# Patient Record
Sex: Male | Born: 1935
Health system: Southern US, Community
[De-identification: ages and names within clinical notes are randomized; demographics above are authoritative.]

## PROBLEM LIST (undated history)

## (undated) DIAGNOSIS — I4892 Unspecified atrial flutter: Secondary | ICD-10-CM

## (undated) DIAGNOSIS — R001 Bradycardia, unspecified: Secondary | ICD-10-CM

## (undated) DIAGNOSIS — Z953 Presence of xenogenic heart valve: Secondary | ICD-10-CM

## (undated) DIAGNOSIS — I350 Nonrheumatic aortic (valve) stenosis: Secondary | ICD-10-CM

## (undated) DIAGNOSIS — I5032 Chronic diastolic (congestive) heart failure: Secondary | ICD-10-CM

## (undated) HISTORY — PX: CATARACT EXTRACTION: SUR2

## (undated) HISTORY — DX: Unspecified atrial flutter: I48.92

## (undated) HISTORY — DX: Bradycardia, unspecified: R00.1

## (undated) HISTORY — PX: APPENDECTOMY: SHX54

## (undated) HISTORY — PX: HAND SURGERY: SHX662

---

## 2005-01-06 ENCOUNTER — Emergency Department (HOSPITAL_COMMUNITY): Admission: EM | Admit: 2005-01-06 | Discharge: 2005-01-06 | Payer: Self-pay | Admitting: Emergency Medicine

## 2009-03-02 ENCOUNTER — Encounter: Admission: RE | Admit: 2009-03-02 | Discharge: 2009-03-02 | Payer: Self-pay | Admitting: Internal Medicine

## 2009-03-21 ENCOUNTER — Ambulatory Visit: Payer: Self-pay | Admitting: Thoracic Surgery

## 2009-06-04 ENCOUNTER — Encounter: Admission: RE | Admit: 2009-06-04 | Discharge: 2009-06-04 | Payer: Self-pay | Admitting: Internal Medicine

## 2009-06-27 ENCOUNTER — Ambulatory Visit: Payer: Self-pay | Admitting: Thoracic Surgery

## 2010-04-07 ENCOUNTER — Encounter: Payer: Self-pay | Admitting: Internal Medicine

## 2010-07-30 NOTE — Letter (Signed)
March 21, 2009   Massie Maroon, MD  [address]  [place], [place] [address]   Re:  LEVY, CEDANO                  DOB:  Aug 16, 1935   Dear Dr. Selena Batten:   I saw the patient back today.  This 75 year old patient is retired, who  was recently doing some work on a saw and has developed a cough.  He  quit smoking many years ago.  Chest x-ray and CT scan was obtained.  The  CT scan showed a 14.9-mm ground-glass lesion in the superior segment of  the right lower lobe.  There are 2 other ill-defined areas in the right  lower lobe.  He has had no fever, chills, or excessive sputum.  No  hemoptysis.   PAST MEDICAL HISTORY:   MEDICATIONS:  He takes aspirin, Mucinex, Spiriva, vitamin E, and  Benadryl.   ALLERGIES:  He has no allergies.   FAMILY HISTORY:  Noncontributory.   SOCIAL HISTORY:  He is married, 2 children.  He is retired.  Quit  smoking in 1968.  Does not drink alcohol on a regular basis.   REVIEW OF SYMPTOMS:  VITAL SIGNS:  He is 194 pounds, he is 6 feet 1  inch.  CARDIAC:  No angina or atrial fibrillation.  PULMONARY:  Productive cough and see history of present illness.  GI:  No nausea, vomiting, constipation, or diarrhea.  GU:  No kidney disease, dysuria, or frequent urination.  VASCULAR:  No claudication, DVT, or TIAs.  NEUROLOGICAL:  No dizziness, headaches, blackouts, or seizures.  MUSCULOSKELETAL:  No arthritis.  PSYCHIATRIC:  No depression or nervousness.  EYE/ENT:  No changes in eyesight or hearing.  HEMATOLOGICAL:  No problems with bleeding, clotting disorders, or  anemia.   PHYSICAL EXAMINATION:  General:  He is a well-developed white male, in  no acute distress.  Vital Signs:  His blood pressure is 142/80, pulse  73, respirations 18, and sats were 94%.  Head, Eyes, Ears, Nose, and  Throat:  Unremarkable.  Neck:  Supple without thyromegaly.  There is no  supraclavicular or axillary adenopathy.  Chest:  Clear to auscultation  and percussion.  Heart:  Regular  sinus rhythm.  No murmurs.  Abdomen:  Soft.  There is no hepatosplenomegaly.  Extremities:  Pulses are 2+.  There is no clubbing or edema.   I feel that the patient probably may have a bronchoalveolar cancer, but  since this is very small and slow growing, I think the best step is just  to follow with serial CT scans.  We plan to get 1 again in 3 months and  then if that is stable in 6 months.  Should there be any change, then we  will proceed with either biopsy or resection and/or a PET scan.  A PET  scan would just more likely be negative at this time, so I will wait  until there is evidence of increased size before proceeding with a PET  scan.  I appreciate the opportunity of seeing the patient.   Sincerely,   Ines Bloomer, M.D.  Electronically Signed   DPB/MEDQ  D:  03/21/2009  T:  03/22/2009  Job:  [id number]

## 2010-07-30 NOTE — Assessment & Plan Note (Signed)
OFFICE VISIT   Erik [name], Erik [name]  DOB:  October 14, 1935                                        June 27, 2009  CHART #:  [id number]   The patient returns today.  His blood pressure was 149/92, pulse 80,  respirations 17, sats 98%.  Lungs clear to auscultation and percussion.  He is doing well overall.  His CT scan shows interval resolution of the  peribronchial vascular ground-glass in the areas in the right lower  lobe, so I think this was all inflammatory.  I will be happy to see him  again if he has any future pulmonary problem.   Ines Bloomer, M.D.  Electronically Signed   DPB/MEDQ  D:  06/27/2009  T:  06/28/2009  Job:  [id number]

## 2011-11-27 DIAGNOSIS — D485 Neoplasm of uncertain behavior of skin: Secondary | ICD-10-CM | POA: Diagnosis not present

## 2012-04-01 DIAGNOSIS — Z961 Presence of intraocular lens: Secondary | ICD-10-CM | POA: Diagnosis not present

## 2012-04-01 DIAGNOSIS — H25019 Cortical age-related cataract, unspecified eye: Secondary | ICD-10-CM | POA: Diagnosis not present

## 2012-04-01 DIAGNOSIS — H2510 Age-related nuclear cataract, unspecified eye: Secondary | ICD-10-CM | POA: Diagnosis not present

## 2012-04-08 DIAGNOSIS — H2510 Age-related nuclear cataract, unspecified eye: Secondary | ICD-10-CM | POA: Diagnosis not present

## 2012-05-03 DIAGNOSIS — H2510 Age-related nuclear cataract, unspecified eye: Secondary | ICD-10-CM | POA: Diagnosis not present

## 2012-05-03 DIAGNOSIS — H25049 Posterior subcapsular polar age-related cataract, unspecified eye: Secondary | ICD-10-CM | POA: Diagnosis not present

## 2012-05-03 DIAGNOSIS — H25019 Cortical age-related cataract, unspecified eye: Secondary | ICD-10-CM | POA: Diagnosis not present

## 2012-07-27 DIAGNOSIS — Z79899 Other long term (current) drug therapy: Secondary | ICD-10-CM | POA: Diagnosis not present

## 2012-07-27 DIAGNOSIS — E7800 Pure hypercholesterolemia, unspecified: Secondary | ICD-10-CM | POA: Diagnosis not present

## 2012-07-27 DIAGNOSIS — Z125 Encounter for screening for malignant neoplasm of prostate: Secondary | ICD-10-CM | POA: Diagnosis not present

## 2012-07-27 DIAGNOSIS — R7309 Other abnormal glucose: Secondary | ICD-10-CM | POA: Diagnosis not present

## 2012-08-03 DIAGNOSIS — R7309 Other abnormal glucose: Secondary | ICD-10-CM | POA: Diagnosis not present

## 2012-08-03 DIAGNOSIS — M109 Gout, unspecified: Secondary | ICD-10-CM | POA: Diagnosis not present

## 2012-08-03 DIAGNOSIS — R011 Cardiac murmur, unspecified: Secondary | ICD-10-CM | POA: Diagnosis not present

## 2012-08-03 DIAGNOSIS — E7800 Pure hypercholesterolemia, unspecified: Secondary | ICD-10-CM | POA: Diagnosis not present

## 2012-08-23 DIAGNOSIS — R011 Cardiac murmur, unspecified: Secondary | ICD-10-CM | POA: Diagnosis not present

## 2012-08-23 DIAGNOSIS — R9431 Abnormal electrocardiogram [ECG] [EKG]: Secondary | ICD-10-CM | POA: Diagnosis not present

## 2013-07-14 DIAGNOSIS — Z961 Presence of intraocular lens: Secondary | ICD-10-CM | POA: Diagnosis not present

## 2013-07-14 DIAGNOSIS — H02839 Dermatochalasis of unspecified eye, unspecified eyelid: Secondary | ICD-10-CM | POA: Diagnosis not present

## 2013-07-14 DIAGNOSIS — H1045 Other chronic allergic conjunctivitis: Secondary | ICD-10-CM | POA: Diagnosis not present

## 2013-07-14 DIAGNOSIS — H43399 Other vitreous opacities, unspecified eye: Secondary | ICD-10-CM | POA: Diagnosis not present

## 2014-06-19 ENCOUNTER — Observation Stay (HOSPITAL_COMMUNITY)
Admission: EM | Admit: 2014-06-19 | Discharge: 2014-06-23 | Disposition: A | Discharge status: Home / Self Care | Payer: Medicare Other | Attending: Internal Medicine | Admitting: Internal Medicine

## 2014-06-19 ENCOUNTER — Emergency Department (HOSPITAL_COMMUNITY): Payer: Medicare Other

## 2014-06-19 ENCOUNTER — Encounter (HOSPITAL_COMMUNITY): Payer: Self-pay | Admitting: Emergency Medicine

## 2014-06-19 DIAGNOSIS — H538 Other visual disturbances: Secondary | ICD-10-CM | POA: Diagnosis not present

## 2014-06-19 DIAGNOSIS — Z87891 Personal history of nicotine dependence: Secondary | ICD-10-CM | POA: Insufficient documentation

## 2014-06-19 DIAGNOSIS — R079 Chest pain, unspecified: Secondary | ICD-10-CM | POA: Diagnosis present

## 2014-06-19 DIAGNOSIS — Z7982 Long term (current) use of aspirin: Secondary | ICD-10-CM | POA: Diagnosis not present

## 2014-06-19 DIAGNOSIS — R0602 Shortness of breath: Secondary | ICD-10-CM | POA: Diagnosis not present

## 2014-06-19 DIAGNOSIS — R778 Other specified abnormalities of plasma proteins: Secondary | ICD-10-CM | POA: Diagnosis present

## 2014-06-19 DIAGNOSIS — I5032 Chronic diastolic (congestive) heart failure: Secondary | ICD-10-CM | POA: Diagnosis not present

## 2014-06-19 DIAGNOSIS — Z9049 Acquired absence of other specified parts of digestive tract: Secondary | ICD-10-CM | POA: Insufficient documentation

## 2014-06-19 DIAGNOSIS — I350 Nonrheumatic aortic (valve) stenosis: Principal | ICD-10-CM | POA: Insufficient documentation

## 2014-06-19 DIAGNOSIS — I509 Heart failure, unspecified: Secondary | ICD-10-CM

## 2014-06-19 DIAGNOSIS — I2510 Atherosclerotic heart disease of native coronary artery without angina pectoris: Secondary | ICD-10-CM | POA: Diagnosis not present

## 2014-06-19 DIAGNOSIS — R7989 Other specified abnormal findings of blood chemistry: Secondary | ICD-10-CM | POA: Diagnosis not present

## 2014-06-19 DIAGNOSIS — R55 Syncope and collapse: Secondary | ICD-10-CM | POA: Diagnosis not present

## 2014-06-19 DIAGNOSIS — R42 Dizziness and giddiness: Secondary | ICD-10-CM | POA: Diagnosis not present

## 2014-06-19 DIAGNOSIS — I25119 Atherosclerotic heart disease of native coronary artery with unspecified angina pectoris: Secondary | ICD-10-CM

## 2014-06-19 HISTORY — DX: Nonrheumatic aortic (valve) stenosis: I35.0

## 2014-06-19 HISTORY — DX: Chronic diastolic (congestive) heart failure: I50.32

## 2014-06-19 LAB — LIPID PANEL
CHOL/HDL RATIO: 3.9 ratio
CHOLESTEROL: 135 mg/dL (ref 0–200)
HDL: 35 mg/dL — ABNORMAL LOW (ref >39)
LDL Cholesterol: 88 mg/dL (ref 0–99)
TRIGLYCERIDES: 60 mg/dL (ref <150)
VLDL: 12 mg/dL (ref 0–40)

## 2014-06-19 LAB — CBC WITH DIFFERENTIAL/PLATELET
BASOS ABS: 0.1 10*3/uL (ref 0.0–0.1)
Basophils Relative: 1 % (ref 0–1)
Eosinophils Absolute: 0.1 10*3/uL (ref 0.0–0.7)
Eosinophils Relative: 2 % (ref 0–5)
HCT: 39.4 % (ref 39.0–52.0)
Hemoglobin: 14 g/dL (ref 13.0–17.0)
Lymphocytes Relative: 20 % (ref 12–46)
Lymphs Abs: 1.1 10*3/uL (ref 0.7–4.0)
MCH: 35.5 pg — ABNORMAL HIGH (ref 26.0–34.0)
MCHC: 35.5 g/dL (ref 30.0–36.0)
MCV: 100 fL (ref 78.0–100.0)
Monocytes Absolute: 0.5 10*3/uL (ref 0.1–1.0)
Monocytes Relative: 9 % (ref 3–12)
NEUTROS PCT: 68 % (ref 43–77)
Neutro Abs: 3.9 10*3/uL (ref 1.7–7.7)
PLATELETS: 172 10*3/uL (ref 150–400)
RBC: 3.94 MIL/uL — ABNORMAL LOW (ref 4.22–5.81)
RDW: 12.6 % (ref 11.5–15.5)
WBC: 5.8 10*3/uL (ref 4.0–10.5)

## 2014-06-19 LAB — TSH: TSH: 2.924 u[IU]/mL (ref 0.350–4.500)

## 2014-06-19 LAB — CREATININE, SERUM
CREATININE: 1.18 mg/dL (ref 0.50–1.35)
GFR calc Af Amer: 67 mL/min — ABNORMAL LOW (ref >90)
GFR calc non Af Amer: 58 mL/min — ABNORMAL LOW (ref >90)

## 2014-06-19 LAB — CBC
HCT: 38.1 % — ABNORMAL LOW (ref 39.0–52.0)
HEMOGLOBIN: 13.8 g/dL (ref 13.0–17.0)
MCH: 36 pg — AB (ref 26.0–34.0)
MCHC: 36.2 g/dL — ABNORMAL HIGH (ref 30.0–36.0)
MCV: 99.5 fL (ref 78.0–100.0)
PLATELETS: 168 10*3/uL (ref 150–400)
RBC: 3.83 MIL/uL — ABNORMAL LOW (ref 4.22–5.81)
RDW: 12.6 % (ref 11.5–15.5)
WBC: 5.6 10*3/uL (ref 4.0–10.5)

## 2014-06-19 LAB — BASIC METABOLIC PANEL
Anion gap: 9 (ref 5–15)
BUN: 10 mg/dL (ref 6–23)
CALCIUM: 8.8 mg/dL (ref 8.4–10.5)
CO2: 23 mmol/L (ref 19–32)
CREATININE: 1.27 mg/dL (ref 0.50–1.35)
Chloride: 102 mmol/L (ref 96–112)
GFR calc Af Amer: 62 mL/min — ABNORMAL LOW (ref >90)
GFR calc non Af Amer: 53 mL/min — ABNORMAL LOW (ref >90)
Glucose, Bld: 97 mg/dL (ref 70–99)
Potassium: 4.2 mmol/L (ref 3.5–5.1)
Sodium: 134 mmol/L — ABNORMAL LOW (ref 135–145)

## 2014-06-19 LAB — TROPONIN I
Troponin I: 0.05 ng/mL — ABNORMAL HIGH (ref <0.031)
Troponin I: 0.06 ng/mL — ABNORMAL HIGH (ref <0.031)

## 2014-06-19 LAB — D-DIMER, QUANTITATIVE (NOT AT ARMC): D DIMER QUANT: 0.66 ug{FEU}/mL — AB (ref 0.00–0.48)

## 2014-06-19 LAB — BRAIN NATRIURETIC PEPTIDE: B NATRIURETIC PEPTIDE 5: 1282.9 pg/mL — AB (ref 0.0–100.0)

## 2014-06-19 MED ORDER — SODIUM CHLORIDE 0.9 % IV SOLN
INTRAVENOUS | Status: DC
  Administered 2014-06-19: 23:00:00 via INTRAVENOUS

## 2014-06-19 MED ORDER — ASPIRIN EC 325 MG PO TBEC
325.0000 mg | DELAYED_RELEASE_TABLET | Freq: Every day | ORAL | Status: DC
  Administered 2014-06-20 – 2014-06-21 (×2): 325 mg via ORAL
  Filled 2014-06-19 (×3): qty 1

## 2014-06-19 MED ORDER — IOHEXOL 350 MG/ML SOLN
100.0000 mL | Freq: Once | INTRAVENOUS | Status: AC | PRN
  Administered 2014-06-19: 100 mL via INTRAVENOUS

## 2014-06-19 MED ORDER — HEPARIN SODIUM (PORCINE) 5000 UNIT/ML IJ SOLN
5000.0000 [IU] | Freq: Three times a day (TID) | INTRAMUSCULAR | Status: DC
  Filled 2014-06-19 (×13): qty 1

## 2014-06-19 MED ORDER — NITROGLYCERIN 0.4 MG SL SUBL: 0.4000 mg | SUBLINGUAL_TABLET | SUBLINGUAL | Status: DC | PRN

## 2014-06-19 MED ORDER — SODIUM CHLORIDE 0.9 % IJ SOLN
3.0000 mL | Freq: Two times a day (BID) | INTRAMUSCULAR | Status: DC
  Administered 2014-06-20 – 2014-06-23 (×6): 3 mL via INTRAVENOUS

## 2014-06-19 NOTE — ED Notes (Signed)
MD Pollina at bedside. 

## 2014-06-19 NOTE — H&P (Addendum)
Hospitalist Admission History and Physical  Patient name: Erik [name] Medical record number: [id number] Date of birth: 1935-08-04 Age: 80 y.o. Gender: male  Primary Care Provider: Jani Gravel, MD  Chief Complaint: chest pain   History of Present Illness:This is a 79 y.o. year old male with no known significant past medical history  presenting with chest pain. Patient states he developed sudden onset of anterior chest wall pain, tightness, shortness of breath dizziness while doing work earlier today. Symptoms progressed to have a questionable presyncopal episode patient had to sit down. Patient denies any loss of consciousness. Is fully aware of symptoms. Wife states that she grabbed patient for dose aspirin as well as water. Symptoms seemed to progressively improved. Patient was coherent by the time of EMS arrival. Patient denies any hemiparesis or confusion. Denies any known prior history of cardiac disease. Has been extremely active his whole life. Chest pain-free on arrival to the ER. Present to the ER temperature 90.8, heart rate in the 30s to the 80s-mainly in the 60s, respirations and intense 20s, blood pressure in the 90s to 120s. Satting 96% on room air. CBC and BMP within normal limits. Chest x-ray with bigeminy and noted T-wave inversions in the lateral leads. Troponin 0.05. BNP of 1280. D-dimer 0.66. Head CT within normal limits. CT angiogram negative for PE. Noted mild stable cardiomegaly on cardiac imaging.  HEART Score: 6-7  Assessment and Plan: Erik [name] is a 79 y.o. year old male presenting with chest pain    Active Problems:   Chest pain  1- Chest Pain  -typical sxs on presentation w/ some concern for possible unstable angina/cardiogenic source of sxs -HEART Score: 6-7 -mildly elevated trop @ 0.05-trend -noted bigeminy w/ t wave inversions in lateral leads-no old EKG for comparison -currently chest pain free -I have asked EDP Pollina to formally consult cards (Dr  Betsey Holiday called me back-has not been able to contact cards-I have also been unable to contact cardiology) -f/u cards recs -full dose ASA -prn NTG  -cycle CEs -risk stratification labs  -tele bed -2D ECHO-no signs of overt heart failure on exam  -anticipate stress testing vs. other invasive studies   FEN/GI: heart healthy diet  Prophylaxis: sub q heparin  Disposition: pending further evaluation  Code Status:Full Code    Patient Active Problem List   Diagnosis Date Noted  . Chest pain 06/19/2014   Past Medical History: History reviewed. No pertinent past medical history.  Past Surgical History: Past Surgical History  Procedure Laterality Date  . Appendectomy    . Hand surgery      Social History: History   Social History  . Marital Status: Divorced    Spouse Name: N/A  . Number of Children: N/A  . Years of Education: N/A   Social History Main Topics  . Smoking status: Never Smoker   . Smokeless tobacco: Not on file  . Alcohol Use: Yes     Comment: occ   . Drug Use: Not on file  . Sexual Activity: Not on file   Other Topics Concern  . None   Social History Narrative  . None    Family History: No family history on file.  Allergies: No Known Allergies  Current Facility-Administered Medications  Medication Dose Route Frequency Provider Last Rate Last Dose  . 0.9 %  sodium chloride infusion   Intravenous Continuous Deneise Lever, MD      . aspirin EC tablet 325 mg  325 mg Oral Daily Micah Flesher  Ernestina Patches, MD      . heparin injection 5,000 Units  5,000 Units Subcutaneous 3 times per day Deneise Lever, MD      . nitroGLYCERIN (NITROSTAT) SL tablet 0.4 mg  0.4 mg Sublingual Q5 min PRN Deneise Lever, MD      . sodium chloride 0.9 % injection 3 mL  3 mL Intravenous Q12H Deneise Lever, MD       Current Outpatient Prescriptions  Medication Sig Dispense Refill  . aspirin 325 MG tablet Take 325 mg by mouth daily.     Review Of Systems: 12 point ROS negative  except as noted above in HPI.  Physical Exam: Filed Vitals:   06/19/14 2030  BP: 105/66  Pulse:   Temp:   Resp: 14    General: alert and cooperative HEENT: PERRLA and extra ocular movement intact Heart: S1, S2 normal, no murmur, rub or gallop, regular rate and rhythm Lungs: clear to auscultation, no wheezes or rales and unlabored breathing Abdomen: abdomen is soft without significant tenderness, masses, organomegaly or guarding Extremities: extremities normal, atraumatic, no cyanosis or edema Skin:no rashes Neurology: normal without focal findings, mental status, speech normal, alert and oriented x3, PERLA and reflexes normal and symmetric  Labs and Imaging: Lab Results  Component Value Date/Time   NA 134* 06/19/2014 04:30 PM   K 4.2 06/19/2014 04:30 PM   CL 102 06/19/2014 04:30 PM   CO2 23 06/19/2014 04:30 PM   BUN 10 06/19/2014 04:30 PM   CREATININE 1.27 06/19/2014 04:30 PM   GLUCOSE 97 06/19/2014 04:30 PM   Lab Results  Component Value Date   WBC 5.8 06/19/2014   HGB 14.0 06/19/2014   HCT 39.4 06/19/2014   MCV 100.0 06/19/2014   PLT 172 06/19/2014    Dg Chest 2 View  06/19/2014   CLINICAL DATA:  Mid lower chest pain, onset today. Intermittent shortness of breath.  EXAM: CHEST  2 VIEW  COMPARISON:  Chest CT 06/04/2009  FINDINGS: Stable apparent cardiomegaly, accentuated by lower mediastinal fat and developmental AP chest narrowing. Normal aortic and hilar contours. There is no edema, consolidation, effusion, or pneumothorax. Remote T11 superior endplate fracture.  IMPRESSION: No active cardiopulmonary disease.   Electronically Signed   By: Monte Fantasia M.D.   On: 06/19/2014 17:23   Ct Head Wo Contrast  06/19/2014   CLINICAL DATA:  Blurred vision and chest pain.  Syncope.  EXAM: CT HEAD WITHOUT CONTRAST  TECHNIQUE: Contiguous axial images were obtained from the base of the skull through the vertex without intravenous contrast.  COMPARISON:  01/06/2005  FINDINGS: Skull  and Sinuses:Negative for fracture or destructive process. The mastoids, middle ears, and imaged paranasal sinuses are clear.  Orbits: Cataract resection bilaterally. No acute findings to explain blurred vision.  Brain: No evidence of acute infarction, hemorrhage, hydrocephalus, or intra-axial mass lesion/mass effect. Stable retro cerebellar CSF accumulation with mild mass effect on the vermis. Cerebral volume loss which is expected for age.  IMPRESSION: Negative head CT.   Electronically Signed   By: Monte Fantasia M.D.   On: 06/19/2014 20:04   Ct Angio Chest Pe W/cm &/or Wo Cm  06/19/2014   CLINICAL DATA:  Chest pain.  EXAM: CT ANGIOGRAPHY CHEST WITH CONTRAST  TECHNIQUE: Multidetector CT imaging of the chest was performed using the standard protocol during bolus administration of intravenous contrast. Multiplanar CT image reconstructions and MIPs were obtained to evaluate the vascular anatomy.  CONTRAST:  113mL OMNIPAQUE IOHEXOL 350 MG/ML SOLN  COMPARISON:  CT scan of chest of June 04, 2009.  FINDINGS: No pneumothorax or pleural effusion is noted. No acute pulmonary disease is noted. There is no evidence of pulmonary embolus. No mediastinal mass or adenopathy is noted. Visualized portion of upper abdomen appears normal. No significant osseous abnormality is noted.  Review of the MIP images confirms the above findings.  IMPRESSION: No evidence of pulmonary embolus.   Electronically Signed   By: Marijo Conception, M.D.   On: 06/19/2014 20:12           Shanda Howells MD  Pager: [phone]

## 2014-06-19 NOTE — ED Provider Notes (Addendum)
CSN: [id number]     Arrival date & time 06/19/14  1557 History   First MD Initiated Contact with Patient 06/19/14 1601     Chief Complaint  Patient presents with  . Bradycardia  . Chest Pain     (Consider location/radiation/quality/duration/timing/severity/associated sxs/prior Treatment) HPI Comments: Patient presents to the ER for evaluation of chest pain and shortness of breath. Patient reports sudden onset of difficulty breathing and substernal chest pain when he was moving a heavy hose in his yard. Patient reports that he needed to sit down because of the symptoms. Wife reports that she found him sitting in the chair and he was not responding. Eyes were open, but he did not respond to her words. She reports that he was drooling and his arms and legs were "flopping".  Patient came around and required approximately 30 minutes before symptoms resolved. Wife did give him aspirin and called EMS. He has been noted to be borderline bradycardic. At this point, patient reports that he is essentially back to normal, but still feels "weak".  Patient is a 79 y.o. male presenting with chest pain.  Chest Pain Associated symptoms: fatigue and shortness of breath     History reviewed. No pertinent past medical history. Past Surgical History  Procedure Laterality Date  . Appendectomy    . Hand surgery     No family history on file. History  Substance Use Topics  . Smoking status: Never Smoker   . Smokeless tobacco: Not on file  . Alcohol Use: Yes     Comment: occ     Review of Systems  Constitutional: Positive for fatigue.  Respiratory: Positive for shortness of breath.   Cardiovascular: Positive for chest pain.  Neurological: Positive for syncope.  All other systems reviewed and are negative.     Allergies  Review of patient's allergies indicates no known allergies.  Home Medications   Prior to Admission medications   Medication Sig Start Date End Date Taking? Authorizing  Provider  aspirin 325 MG tablet Take 325 mg by mouth daily.   Yes Historical Provider, MD   BP 95/74 mmHg  Pulse 62  Temp(Src) 98 F (36.7 C) (Oral)  Resp 16  Ht 6\' 1"  (1.854 m)  Wt 195 lb (88.451 kg)  BMI 25.73 kg/m2  SpO2 96% Physical Exam  Constitutional: He is oriented to person, place, and time. He appears well-developed and well-nourished. No distress.  HENT:  Head: Normocephalic and atraumatic.  Right Ear: Hearing normal.  Left Ear: Hearing normal.  Nose: Nose normal.  Mouth/Throat: Oropharynx is clear and moist and mucous membranes are normal.  Eyes: Conjunctivae and EOM are normal. Pupils are equal, round, and reactive to light.  Neck: Normal range of motion. Neck supple.  Cardiovascular: Regular rhythm, S1 normal and S2 normal.  Exam reveals no gallop and no friction rub.   No murmur heard. Pulmonary/Chest: Effort normal and breath sounds normal. No respiratory distress. He exhibits no tenderness.  Abdominal: Soft. Normal appearance and bowel sounds are normal. There is no hepatosplenomegaly. There is no tenderness. There is no rebound, no guarding, no tenderness at McBurney's point and negative Murphy's sign. No hernia.  Musculoskeletal: Normal range of motion.  Neurological: He is alert and oriented to person, place, and time. He has normal strength. No cranial nerve deficit or sensory deficit. Coordination normal. GCS eye subscore is 4. GCS verbal subscore is 5. GCS motor subscore is 6.  Skin: Skin is warm, dry and intact. No rash noted.  No cyanosis.  Psychiatric: He has a normal mood and affect. His speech is normal and behavior is normal. Thought content normal.  Nursing note and vitals reviewed.   ED Course  Procedures (including critical care time) Labs Review Labs Reviewed  CBC WITH DIFFERENTIAL/PLATELET - Abnormal; Notable for the following:    RBC 3.94 (*)    MCH 35.5 (*)    All other components within normal limits  BASIC METABOLIC PANEL - Abnormal;  Notable for the following:    Sodium 134 (*)    GFR calc non Af Amer 53 (*)    GFR calc Af Amer 62 (*)    All other components within normal limits  TROPONIN I - Abnormal; Notable for the following:    Troponin I 0.05 (*)    All other components within normal limits  BRAIN NATRIURETIC PEPTIDE - Abnormal; Notable for the following:    B Natriuretic Peptide 1282.9 (*)    All other components within normal limits  D-DIMER, QUANTITATIVE - Abnormal; Notable for the following:    D-Dimer, Quant 0.66 (*)    All other components within normal limits    Imaging Review Dg Chest 2 View  06/19/2014   CLINICAL DATA:  Mid lower chest pain, onset today. Intermittent shortness of breath.  EXAM: CHEST  2 VIEW  COMPARISON:  Chest CT 06/04/2009  FINDINGS: Stable apparent cardiomegaly, accentuated by lower mediastinal fat and developmental AP chest narrowing. Normal aortic and hilar contours. There is no edema, consolidation, effusion, or pneumothorax. Remote T11 superior endplate fracture.  IMPRESSION: No active cardiopulmonary disease.   Electronically Signed   By: Monte Fantasia M.D.   On: 06/19/2014 17:23   Ct Head Wo Contrast  06/19/2014   CLINICAL DATA:  Blurred vision and chest pain.  Syncope.  EXAM: CT HEAD WITHOUT CONTRAST  TECHNIQUE: Contiguous axial images were obtained from the base of the skull through the vertex without intravenous contrast.  COMPARISON:  01/06/2005  FINDINGS: Skull and Sinuses:Negative for fracture or destructive process. The mastoids, middle ears, and imaged paranasal sinuses are clear.  Orbits: Cataract resection bilaterally. No acute findings to explain blurred vision.  Brain: No evidence of acute infarction, hemorrhage, hydrocephalus, or intra-axial mass lesion/mass effect. Stable retro cerebellar CSF accumulation with mild mass effect on the vermis. Cerebral volume loss which is expected for age.  IMPRESSION: Negative head CT.   Electronically Signed   By: Monte Fantasia M.D.    On: 06/19/2014 20:04   Ct Angio Chest Pe W/cm &/or Wo Cm  06/19/2014   CLINICAL DATA:  Chest pain.  EXAM: CT ANGIOGRAPHY CHEST WITH CONTRAST  TECHNIQUE: Multidetector CT imaging of the chest was performed using the standard protocol during bolus administration of intravenous contrast. Multiplanar CT image reconstructions and MIPs were obtained to evaluate the vascular anatomy.  CONTRAST:  188mL OMNIPAQUE IOHEXOL 350 MG/ML SOLN  COMPARISON:  CT scan of chest of June 04, 2009.  FINDINGS: No pneumothorax or pleural effusion is noted. No acute pulmonary disease is noted. There is no evidence of pulmonary embolus. No mediastinal mass or adenopathy is noted. Visualized portion of upper abdomen appears normal. No significant osseous abnormality is noted.  Review of the MIP images confirms the above findings.  IMPRESSION: No evidence of pulmonary embolus.   Electronically Signed   By: Marijo Conception, M.D.   On: 06/19/2014 20:12     EKG Interpretation   Date/Time:  Monday June 19 2014 15:59:23 EDT Ventricular Rate:  5  PR Interval:  215 QRS Duration: 100 QT Interval:  459 QTC Calculation: 470 R Axis:   7 Text Interpretation:  Sinus rhythm Supraventricular bigeminy Borderline  prolonged PR interval RSR' in V1 or V2, probably normal variant Probable  anteroseptal infarct, old Repol abnrm suggests ischemia, lateral leads No  previous tracing Confirmed by Jasmarie Coppock  MD, Alita Waldren [phone]) on 06/19/2014  4:22:17 PM      MDM   Final diagnoses:  Shortness of breath  Syncope  Chest pain    Patient presents to the ER for evaluation of chest pain and syncope. Patient was working in his yard, performing some strenuous activity when he had onset of chest pain and shortness of breath. Patient sat down in a chair and then had an episode of syncope or at least near syncope. After he became more awake and alert, he was still experiencing the chest pain and shortness of breath. Symptoms lasted for approximately  30 minutes before resolution. He is back to baseline at arrival to the ER.  There was reportedly bradycardia for EMS. He has been borderline here with heart rates mainly in the low 60s. No obvious arrhythmia has been noted, although he did have PVCs present.  Patient does have a borderline troponin of 0.05. His BNP is elevated. No overt heart failure seen on chest x-ray. He does not have any significant swelling of his extremities. PE was considered. D-dimer was elevated. CT angiography was performed. No evidence of PE was noted.  Discussed with Dr. Ernestina Patches, hospitalist. Based on lateral T wave inversions, possible ST depressions, asks that cardiology be consulted. Will be admitted to the hospitalist service.  Addendum: Multiple attempts to contact cardiology were unsuccessful. Discussed briefly with Dr. Ernestina Patches, he will continue attempts. Patient was pain-free at time of admission.  Orpah Greek, MD 06/19/14 2040  Orpah Greek, MD 06/19/14 [phone]

## 2014-06-19 NOTE — ED Notes (Signed)
Attempted report x1. 

## 2014-06-19 NOTE — ED Notes (Addendum)
Patient denies pain and is resting comfortably. Phlebotomy at bedside.

## 2014-06-19 NOTE — ED Notes (Signed)
Attempted report x 2 

## 2014-06-19 NOTE — ED Notes (Addendum)
Per EMS, pt from home was outside moving a water hose when he experienced a sudden 10/10 sharp shooting episode of lower central CP with diaphoresis, lightheadedness, blurred vision, and SOB. Per wife, this episode lasted about 5 minutes. Upon EMS arrival, pt AO x 4, CP resolved, skin warm and dry. Pt given 324 ASA PTA. EMS initial HR 53 then increased into 60s. VSS. NAD noted.

## 2014-06-20 ENCOUNTER — Encounter (HOSPITAL_COMMUNITY): Payer: Self-pay | Admitting: General Practice

## 2014-06-20 DIAGNOSIS — R011 Cardiac murmur, unspecified: Secondary | ICD-10-CM

## 2014-06-20 DIAGNOSIS — I499 Cardiac arrhythmia, unspecified: Secondary | ICD-10-CM | POA: Diagnosis not present

## 2014-06-20 DIAGNOSIS — R7989 Other specified abnormal findings of blood chemistry: Secondary | ICD-10-CM

## 2014-06-20 DIAGNOSIS — R079 Chest pain, unspecified: Secondary | ICD-10-CM

## 2014-06-20 DIAGNOSIS — I359 Nonrheumatic aortic valve disorder, unspecified: Secondary | ICD-10-CM

## 2014-06-20 DIAGNOSIS — I350 Nonrheumatic aortic (valve) stenosis: Secondary | ICD-10-CM | POA: Diagnosis not present

## 2014-06-20 LAB — CBC WITH DIFFERENTIAL/PLATELET
BASOS PCT: 1 % (ref 0–1)
Basophils Absolute: 0.1 10*3/uL (ref 0.0–0.1)
EOS ABS: 0.1 10*3/uL (ref 0.0–0.7)
Eosinophils Relative: 2 % (ref 0–5)
HCT: 40.3 % (ref 39.0–52.0)
HEMOGLOBIN: 14.3 g/dL (ref 13.0–17.0)
Lymphocytes Relative: 24 % (ref 12–46)
Lymphs Abs: 1.2 10*3/uL (ref 0.7–4.0)
MCH: 36.4 pg — AB (ref 26.0–34.0)
MCHC: 35.5 g/dL (ref 30.0–36.0)
MCV: 102.5 fL — ABNORMAL HIGH (ref 78.0–100.0)
Monocytes Absolute: 0.5 10*3/uL (ref 0.1–1.0)
Monocytes Relative: 11 % (ref 3–12)
NEUTROS PCT: 62 % (ref 43–77)
Neutro Abs: 3.1 10*3/uL (ref 1.7–7.7)
Platelets: 174 10*3/uL (ref 150–400)
RBC: 3.93 MIL/uL — AB (ref 4.22–5.81)
RDW: 12.9 % (ref 11.5–15.5)
WBC: 5.1 10*3/uL (ref 4.0–10.5)

## 2014-06-20 LAB — COMPREHENSIVE METABOLIC PANEL
ALT: 16 U/L (ref 0–53)
AST: 23 U/L (ref 0–37)
Albumin: 3.6 g/dL (ref 3.5–5.2)
Alkaline Phosphatase: 59 U/L (ref 39–117)
Anion gap: 4 — ABNORMAL LOW (ref 5–15)
BUN: 8 mg/dL (ref 6–23)
CHLORIDE: 104 mmol/L (ref 96–112)
CO2: 29 mmol/L (ref 19–32)
CREATININE: 1.24 mg/dL (ref 0.50–1.35)
Calcium: 8.7 mg/dL (ref 8.4–10.5)
GFR calc Af Amer: 63 mL/min — ABNORMAL LOW (ref >90)
GFR calc non Af Amer: 55 mL/min — ABNORMAL LOW (ref >90)
Glucose, Bld: 100 mg/dL — ABNORMAL HIGH (ref 70–99)
POTASSIUM: 4.2 mmol/L (ref 3.5–5.1)
Sodium: 137 mmol/L (ref 135–145)
TOTAL PROTEIN: 6.6 g/dL (ref 6.0–8.3)
Total Bilirubin: 1.1 mg/dL (ref 0.3–1.2)

## 2014-06-20 LAB — TROPONIN I
Troponin I: 0.05 ng/mL — ABNORMAL HIGH (ref <0.031)
Troponin I: 0.05 ng/mL — ABNORMAL HIGH (ref <0.031)

## 2014-06-20 NOTE — Progress Notes (Signed)
UR completed 

## 2014-06-20 NOTE — Consult Note (Signed)
Cardiology Consultation  Erik [name]    [id number] [birth year]  Reason for Consult: Chest pain  Requesting Physician: Dr. Ernestina Patches  Primary Cardiologist: new  HPI: Erik [name] is a 79 year old white male who denies any significant cardiac history.  However, he has been told of having a murmur remotely in the past and many years ago may have had an echo cardiogram done@[email or web address]  [facility].  He has been active and denies any previous history of exertional chest pain or palpitations.  Yesterday after he had been moving toes from his yard he developed fairly sudden onset of somewhat sharp chest pain with chest tightness and also noted dizziness with transient visual disturbance.  He had to sit down for stability.  Ultimately, his discomfort subsided in a ~ 5 minutes. He presented to the emergency room and he was noted to have a bigeminal like rhythm and T-wave abnormality in his lateral leads.  Troponin was minimally positive at 0.05.  BNP was 1280, and d-dimer was 0.66.  A head CT was within normal limits and a CT angiogram was negative for PE.  Cardiology consultation is now requested.   Past Medical History  Diagnosis Date  . Heart murmur   . Chest pain 06/19/2014   Past Surgical History  Procedure Laterality Date  . Appendectomy    . Hand surgery    . Cataract extraction Bilateral ? 2013    & 2014    FAMHx: History reviewed. No pertinent family history.   Both parents are deceased.  His mother died at age 59.  His father died at 42 and had lung cancer.  SOCHx:  reports that he has quit smoking. He has never used smokeless tobacco. He reports that he drinks alcohol. He reports that he does not use illicit drugs.   He is married.  He is retired from owning a International aid/development worker.  He has 2 children.   ALLERGIES: No Known Allergies   HOME MEDICATIONS: Prescriptions prior to admission  Medication Sig Dispense Refill Last Dose  . aspirin 325 MG tablet Take 325 mg by mouth daily.    06/19/2014 at [place]: . aspirin EC  325 mg Oral Daily  . heparin  5,000 Units Subcutaneous 3 times per day  . sodium chloride  3 mL Intravenous Q12H    ROS General: Negative; No fevers, chills, or night sweats;  HEENT: Negative; No changes in vision or hearing, sinus congestion, difficulty swallowing Pulmonary: Negative; No cough, wheezing, shortness of breath, hemoptysis Cardiovascular: Negative; No chest pain, presyncope, syncope, palpitations GI: Negative; No nausea, vomiting, diarrhea, or abdominal pain GU: Negative; No dysuria, hematuria, or difficulty voiding Musculoskeletal: Negative; no myalgias, joint pain, or weakness Hematologic/Oncology: Negative; no easy bruising, bleeding Endocrine: Negative; no heat/cold intolerance; no diabetes Neuro: Negative; no changes in balance, headaches Skin: Negative; No rashes or skin lesions Psychiatric: Negative; No behavioral problems, depression Sleep: Negative; No snoring, daytime sleepiness, hypersomnolence, bruxism, restless legs, hypnogognic hallucinations, no cataplexy Other comprehensive 14 point system review is negative.  VITALS: Blood pressure 111/73, pulse 76, temperature 98.2 F (36.8 C), temperature source Oral, resp. rate 18, height 6\' 1"  (1.854 m), weight 193 lb (87.544 kg), SpO2 98 %.  PHYSICAL EXAM: General appearance: alert, cooperative and no distress HEENT: [place]/AT, PERRL EOM full; Neck: no adenopathy, no JVD, supple, symmetrical, trachea midline and thyroid not enlarged, symmetric, no tenderness/mass/nodules Lungs: clear to auscultation bilaterally Heart: Atrial bigeminal rhythm.  There is a 2-3/6 mid to late peaking harsh systolic  murmur heard best at the aortic area radiating down the left sternal border and apex suggestive of aortic valve stenosis; no diastolic murmur was appreciated.  There was no S3 gallop.  There are no rubs thrills or heaves. Abdomen: soft, non-tender; bowel sounds normal; no  masses,  no organomegaly Extremities: no edema, redness or tenderness in the calves or thighs Pulses: 2+ and symmetric Skin: Skin color, texture, turgor normal. No rashes or lesions Neurologic: Grossly normal  ECG (independently read by me): Sinus rhythm with atrial bigeminy at 63 bpm.  Mild RV conduction delay.  Mild inferior ST changes with more pronounced ST abnormality in the 4 through V6.  QTc interval mildly prolonged at 470 ms.  LABS: Results for orders placed or performed during the hospital encounter of 06/19/14 (from the past 48 hour(s))  CBC with Differential/Platelet     Status: Abnormal   Collection Time: 06/19/14  4:30 PM  Result Value Ref Range   WBC 5.8 4.0 - 10.5 K/uL   RBC 3.94 (L) 4.22 - 5.81 MIL/uL   Hemoglobin 14.0 13.0 - 17.0 g/dL   HCT 39.4 39.0 - 52.0 %   MCV 100.0 78.0 - 100.0 fL   MCH 35.5 (H) 26.0 - 34.0 pg   MCHC 35.5 30.0 - 36.0 g/dL   RDW 12.6 11.5 - 15.5 %   Platelets 172 150 - 400 K/uL   Neutrophils Relative % 68 43 - 77 %   Neutro Abs 3.9 1.7 - 7.7 K/uL   Lymphocytes Relative 20 12 - 46 %   Lymphs Abs 1.1 0.7 - 4.0 K/uL   Monocytes Relative 9 3 - 12 %   Monocytes Absolute 0.5 0.1 - 1.0 K/uL   Eosinophils Relative 2 0 - 5 %   Eosinophils Absolute 0.1 0.0 - 0.7 K/uL   Basophils Relative 1 0 - 1 %   Basophils Absolute 0.1 0.0 - 0.1 K/uL  Basic metabolic panel     Status: Abnormal   Collection Time: 06/19/14  4:30 PM  Result Value Ref Range   Sodium 134 (L) 135 - 145 mmol/L   Potassium 4.2 3.5 - 5.1 mmol/L   Chloride 102 96 - 112 mmol/L   CO2 23 19 - 32 mmol/L   Glucose, Bld 97 70 - 99 mg/dL   BUN 10 6 - 23 mg/dL   Creatinine, Ser 1.27 0.50 - 1.35 mg/dL   Calcium 8.8 8.4 - 10.5 mg/dL   GFR calc non Af Amer 53 (L) >90 mL/min   GFR calc Af Amer 62 (L) >90 mL/min    Comment: (NOTE) The eGFR has been calculated using the CKD EPI equation. This calculation has not been validated in all clinical situations. eGFR's persistently <90 mL/min signify  possible Chronic Kidney Disease.    Anion gap 9 5 - 15  Troponin I     Status: Abnormal   Collection Time: 06/19/14  4:30 PM  Result Value Ref Range   Troponin I 0.05 (H) <0.031 ng/mL    Comment:        PERSISTENTLY INCREASED TROPONIN VALUES IN THE RANGE OF 0.04-0.49 ng/mL CAN BE SEEN IN:       -UNSTABLE ANGINA       -CONGESTIVE HEART FAILURE       -MYOCARDITIS       -CHEST TRAUMA       -ARRYHTHMIAS       -LATE PRESENTING MYOCARDIAL INFARCTION       -COPD   CLINICAL FOLLOW-UP RECOMMENDED.  Brain natriuretic peptide     Status: Abnormal   Collection Time: 06/19/14  4:30 PM  Result Value Ref Range   B Natriuretic Peptide 1282.9 (H) 0.0 - 100.0 pg/mL  D-dimer, quantitative     Status: Abnormal   Collection Time: 06/19/14  4:30 PM  Result Value Ref Range   D-Dimer, Quant 0.66 (H) 0.00 - 0.48 ug/mL-FEU    Comment:        AT THE INHOUSE ESTABLISHED CUTOFF VALUE OF 0.48 ug/mL FEU, THIS ASSAY HAS BEEN DOCUMENTED IN THE LITERATURE TO HAVE A SENSITIVITY AND NEGATIVE PREDICTIVE VALUE OF AT LEAST 98 TO 99%.  THE TEST RESULT SHOULD BE CORRELATED WITH AN ASSESSMENT OF THE CLINICAL PROBABILITY OF DVT / VTE.   TSH     Status: None   Collection Time: 06/19/14  9:03 PM  Result Value Ref Range   TSH 2.924 0.350 - 4.500 uIU/mL  CBC     Status: Abnormal   Collection Time: 06/19/14  9:34 PM  Result Value Ref Range   WBC 5.6 4.0 - 10.5 K/uL   RBC 3.83 (L) 4.22 - 5.81 MIL/uL   Hemoglobin 13.8 13.0 - 17.0 g/dL   HCT 74.2 (L) 42.0 - 82.9 %   MCV 99.5 78.0 - 100.0 fL   MCH 36.0 (H) 26.0 - 34.0 pg   MCHC 36.2 (H) 30.0 - 36.0 g/dL   RDW 90.7 93.0 - 89.7 %   Platelets 168 150 - 400 K/uL  Creatinine, serum     Status: Abnormal   Collection Time: 06/19/14  9:34 PM  Result Value Ref Range   Creatinine, Ser 1.18 0.50 - 1.35 mg/dL   GFR calc non Af Amer 58 (L) >90 mL/min   GFR calc Af Amer 67 (L) >90 mL/min    Comment: (NOTE) The eGFR has been calculated using the CKD EPI  equation. This calculation has not been validated in all clinical situations. eGFR's persistently <90 mL/min signify possible Chronic Kidney Disease.   Lipid panel     Status: Abnormal   Collection Time: 06/19/14  9:34 PM  Result Value Ref Range   Cholesterol 135 0 - 200 mg/dL   Triglycerides 60 <817 mg/dL   HDL 35 (L) >52 mg/dL   Total CHOL/HDL Ratio 3.9 RATIO   VLDL 12 0 - 40 mg/dL   LDL Cholesterol 88 0 - 99 mg/dL    Comment:        Total Cholesterol/HDL:CHD Risk Coronary Heart Disease Risk Table                     Men   Women  1/2 Average Risk   3.4   3.3  Average Risk       5.0   4.4  2 X Average Risk   9.6   7.1  3 X Average Risk  23.4   11.0        Use the calculated Patient Ratio above and the CHD Risk Table to determine the patient's CHD Risk.        ATP III CLASSIFICATION (LDL):  <100     mg/dL   Optimal  036-505  mg/dL   Near or Above                    Optimal  130-159  mg/dL   Borderline  548-295  mg/dL   High  >215     mg/dL   Very High   Troponin I  Status: Abnormal   Collection Time: 06/19/14  9:34 PM  Result Value Ref Range   Troponin I 0.06 (H) <0.031 ng/mL    Comment:        PERSISTENTLY INCREASED TROPONIN VALUES IN THE RANGE OF 0.04-0.49 ng/mL CAN BE SEEN IN:       -UNSTABLE ANGINA       -CONGESTIVE HEART FAILURE       -MYOCARDITIS       -CHEST TRAUMA       -ARRYHTHMIAS       -LATE PRESENTING MYOCARDIAL INFARCTION       -COPD   CLINICAL FOLLOW-UP RECOMMENDED.   Comprehensive metabolic panel     Status: Abnormal   Collection Time: 06/20/14  2:39 AM  Result Value Ref Range   Sodium 137 135 - 145 mmol/L   Potassium 4.2 3.5 - 5.1 mmol/L   Chloride 104 96 - 112 mmol/L   CO2 29 19 - 32 mmol/L   Glucose, Bld 100 (H) 70 - 99 mg/dL   BUN 8 6 - 23 mg/dL   Creatinine, Ser 1.24 0.50 - 1.35 mg/dL   Calcium 8.7 8.4 - 10.5 mg/dL   Total Protein 6.6 6.0 - 8.3 g/dL   Albumin 3.6 3.5 - 5.2 g/dL   AST 23 0 - 37 U/L   ALT 16 0 - 53 U/L   Alkaline  Phosphatase 59 39 - 117 U/L   Total Bilirubin 1.1 0.3 - 1.2 mg/dL   GFR calc non Af Amer 55 (L) >90 mL/min   GFR calc Af Amer 63 (L) >90 mL/min    Comment: (NOTE) The eGFR has been calculated using the CKD EPI equation. This calculation has not been validated in all clinical situations. eGFR's persistently <90 mL/min signify possible Chronic Kidney Disease.    Anion gap 4 (L) 5 - 15  CBC WITH DIFFERENTIAL     Status: Abnormal   Collection Time: 06/20/14  2:39 AM  Result Value Ref Range   WBC 5.1 4.0 - 10.5 K/uL   RBC 3.93 (L) 4.22 - 5.81 MIL/uL   Hemoglobin 14.3 13.0 - 17.0 g/dL   HCT 40.3 39.0 - 52.0 %   MCV 102.5 (H) 78.0 - 100.0 fL   MCH 36.4 (H) 26.0 - 34.0 pg   MCHC 35.5 30.0 - 36.0 g/dL   RDW 12.9 11.5 - 15.5 %   Platelets 174 150 - 400 K/uL   Neutrophils Relative % 62 43 - 77 %   Neutro Abs 3.1 1.7 - 7.7 K/uL   Lymphocytes Relative 24 12 - 46 %   Lymphs Abs 1.2 0.7 - 4.0 K/uL   Monocytes Relative 11 3 - 12 %   Monocytes Absolute 0.5 0.1 - 1.0 K/uL   Eosinophils Relative 2 0 - 5 %   Eosinophils Absolute 0.1 0.0 - 0.7 K/uL   Basophils Relative 1 0 - 1 %   Basophils Absolute 0.1 0.0 - 0.1 K/uL  Troponin I     Status: Abnormal   Collection Time: 06/20/14  2:39 AM  Result Value Ref Range   Troponin I 0.05 (H) <0.031 ng/mL    Comment:        PERSISTENTLY INCREASED TROPONIN VALUES IN THE RANGE OF 0.04-0.49 ng/mL CAN BE SEEN IN:       -UNSTABLE ANGINA       -CONGESTIVE HEART FAILURE       -MYOCARDITIS       -CHEST TRAUMA       -ARRYHTHMIAS       -  LATE PRESENTING MYOCARDIAL INFARCTION       -COPD   CLINICAL FOLLOW-UP RECOMMENDED.     IMAGING: Dg Chest 2 View  06/19/2014   CLINICAL DATA:  Mid lower chest pain, onset today. Intermittent shortness of breath.  EXAM: CHEST  2 VIEW  COMPARISON:  Chest CT 06/04/2009  FINDINGS: Stable apparent cardiomegaly, accentuated by lower mediastinal fat and developmental AP chest narrowing. Normal aortic and hilar contours. There is  no edema, consolidation, effusion, or pneumothorax. Remote T11 superior endplate fracture.  IMPRESSION: No active cardiopulmonary disease.   Electronically Signed   By: Monte Fantasia M.D.   On: 06/19/2014 17:23   Ct Head Wo Contrast  06/19/2014   CLINICAL DATA:  Blurred vision and chest pain.  Syncope.  EXAM: CT HEAD WITHOUT CONTRAST  TECHNIQUE: Contiguous axial images were obtained from the base of the skull through the vertex without intravenous contrast.  COMPARISON:  01/06/2005  FINDINGS: Skull and Sinuses:Negative for fracture or destructive process. The mastoids, middle ears, and imaged paranasal sinuses are clear.  Orbits: Cataract resection bilaterally. No acute findings to explain blurred vision.  Brain: No evidence of acute infarction, hemorrhage, hydrocephalus, or intra-axial mass lesion/mass effect. Stable retro cerebellar CSF accumulation with mild mass effect on the vermis. Cerebral volume loss which is expected for age.  IMPRESSION: Negative head CT.   Electronically Signed   By: Monte Fantasia M.D.   On: 06/19/2014 20:04   Ct Angio Chest Pe W/cm &/or Wo Cm  06/19/2014   CLINICAL DATA:  Chest pain.  EXAM: CT ANGIOGRAPHY CHEST WITH CONTRAST  TECHNIQUE: Multidetector CT imaging of the chest was performed using the standard protocol during bolus administration of intravenous contrast. Multiplanar CT image reconstructions and MIPs were obtained to evaluate the vascular anatomy.  CONTRAST:  185mL OMNIPAQUE IOHEXOL 350 MG/ML SOLN  COMPARISON:  CT scan of chest of June 04, 2009.  FINDINGS: No pneumothorax or pleural effusion is noted. No acute pulmonary disease is noted. There is no evidence of pulmonary embolus. No mediastinal mass or adenopathy is noted. Visualized portion of upper abdomen appears normal. No significant osseous abnormality is noted.  Review of the MIP images confirms the above findings.  IMPRESSION: No evidence of pulmonary embolus.   Electronically Signed   By: Marijo Conception,  M.D.   On: 06/19/2014 20:12    IMPRESSION:  1.  Chest pain/presyncope/ mildly elevated troponin 2.  Physical examination highly suggestive of at least moderate to moderately severe aortic valve stenosis with a mid-to-late peaking systolic harsh murmur.  3.  Abnormal ECG with significant T-wave abnormality V4 through V6 and mild ST changes inferiorly; this may be secondary to LVH with repolarization changes from aortic valve stenosis versus ischemia. 4.  Atrial bigeminal rhythm 5.  Elevated BNP probably contributed by transient acute systolic and diastolic heart failure   RECOMMENDATION: The patient's physical exam suggests aortic valve stenosis.  With his absence of hypertension by history, but with evidence for significant ST-T changes on ECG he may have LVH with strain secondary to aortic valve stenosis.  I suspect with history  Yesterday he may have developed transient cerebral hypoperfusion as result of his aortic stenosis contributing to his presyncope.  Initial troponin is mildly positive.  Recommend 2-D echo Doppler study to evaluate systolic and diastolic function, assessment for wall motion abnormality, as well as valvular architecture.  Previous to yesterday's episode the patient denies any change in exertional capacity, or exertional chest pressure.  If significant aortic  stenosis is demonstrated.  I would recommend right and left heart cardiac catheterization for definitive assessment of his coronary as well as valvular anatomy.  Attending:  Troy Sine, MD, [facility] 06/20/2014 12:06 PM

## 2014-06-20 NOTE — Care Management Note (Signed)
    Page 1 of 1   06/23/2014     5:14:09 PM CARE MANAGEMENT NOTE 06/23/2014  Patient:  Erik [name], Erik [name]   Account Number:  [id number]  Date Initiated:  06/20/2014  Documentation initiated by:  Yohana Bartha  Subjective/Objective Assessment:   Pt adm on 06/19/14 with CP with pos troponins.  PTA, pt resides at home with spouse.     Action/Plan:   Will follow for dc needs as pt progresses.   Anticipated DC Date:  06/21/2014   Anticipated DC Plan:  [place]  CM consult      Choice offered to / List presented to:             Status of service:  Completed, signed off Medicare Important Message given?  YES (If response is "NO", the following Medicare IM given date fields will be blank) Date Medicare IM given:  06/22/2014 Medicare IM given by:  Meldon Hanzlik Date Additional Medicare IM given:   Additional Medicare IM given by:    Discharge Disposition:  HOME/SELF CARE  Per UR Regulation:  Reviewed for med. necessity/level of care/duration of stay  If discussed at [place] of Stay Meetings, dates discussed:    Comments:

## 2014-06-20 NOTE — Progress Notes (Signed)
[facility] PROGRESS NOTE  Erik [name] KGM:[id number] DOB: 1935/11/23 DOA: 06/19/2014 PCP: Jani Gravel, MD  Brief narrative 79 year old male with no prior medical history ( reports having a murmur since childhood) presented with chest pain after he was working in his yard and lifting the water hose. The pain was substernal and radiating across the chest lasting for a few minutes. As per the patient's wife he also complained of some dizziness and transient visual disturbance. The chest pain subsided after EMS arrived. In the ED EKG showed bigeminal rhythm and T-wave inversion in lateral leads with minimally elevated troponin. BNP was 1218 and mildly elevated d-dimer. Head CT was unremarkable a CT angiogram was negative for PE. Patient admitted to hospitalist service on telemetry. Cardiology consulted.   Assessment/Plan: Chest pain with elevated troponin Monitor on telemetry. No further chest pain symptoms. Troponin has peaked at 0.6 . Has normal EKG changes with T-wave inversion in lateral leads. Also has significant systolic murmur was for cardiology is concerning for moderate to severe aortic stenosis.Also his transient visual symptoms could be related to cerebral hypoperfusion from underlying severe AS. 2-D echo pending. - continue full dose aspirin and S/l nitrate. Avoid afterload reducing agents for now. check lipid panel in a.m. -Cardiology plan on right and left heart catheterization.  Diet: Heart healthy DVT prophylaxis: Subcutaneous heparin  Code Status:full code  Communication: wife at bedside  Disposition Plan: inpt  Consultants:  cardiology  Procedures:  2d echo pending  Antibiotics: None  HPI/Subjective  patient seen and examined. Denies any further chest pain. Admission H&P reviewed  Objective: Filed Vitals:   06/20/14 0957  BP: 111/73  Pulse: 76  Temp:   Resp: 18    Intake/Output Summary (Last 24 hours) at 06/20/14 1327 Last data filed at 06/20/14  0957  Gross per 24 hour  Intake 1180.83 ml  Output      0 ml  Net 1180.83 ml   Filed Weights   06/19/14 1602 06/19/14 2256  Weight: 88.451 kg (195 lb) 87.544 kg (193 lb)    Exam:   General:  Elderly male in no acute distress  HEENT: No pallor, moist oral mucosa, supple neck, no JVD  Cardiovascular: normal U4&Q0, systolic murmur 4/6   Chest: Clear to auscultation bilaterally   Abdomen: soft, NT, ND  Musculoskeletal: On, no edema  CNS: Alert and oriented   Data Reviewed: Basic Metabolic Panel:  Recent Labs Lab 06/19/14 1630 06/19/14 2134 06/20/14 0239  NA 134*  --  137  K 4.2  --  4.2  CL 102  --  104  CO2 23  --  29  GLUCOSE 97  --  100*  BUN 10  --  8  CREATININE 1.27 1.18 1.24  CALCIUM 8.8  --  8.7   Liver Function Tests:  Recent Labs Lab 06/20/14 0239  AST 23  ALT 16  ALKPHOS 59  BILITOT 1.1  PROT 6.6  ALBUMIN 3.6   No results for input(s): LIPASE, AMYLASE in the last 168 hours. No results for input(s): AMMONIA in the last 168 hours. CBC:  Recent Labs Lab 06/19/14 1630 06/19/14 2134 06/20/14 0239  WBC 5.8 5.6 5.1  NEUTROABS 3.9  --  3.1  HGB 14.0 13.8 14.3  HCT 39.4 38.1* 40.3  MCV 100.0 99.5 102.5*  PLT 172 168 174   Cardiac Enzymes:  Recent Labs Lab 06/19/14 1630 06/19/14 2134 06/20/14 0239 06/20/14 1034  TROPONINI 0.05* 0.06* 0.05* 0.05*   BNP (last 3 results)  Recent Labs  06/19/14 1630  BNP 1282.9*    ProBNP (last 3 results) No results for input(s): PROBNP in the last 8760 hours.  CBG: No results for input(s): GLUCAP in the last 168 hours.  No results found for this or any previous visit (from the past 240 hour(s)).   Studies: Dg Chest 2 View  06/19/2014   CLINICAL DATA:  Mid lower chest pain, onset today. Intermittent shortness of breath.  EXAM: CHEST  2 VIEW  COMPARISON:  Chest CT 06/04/2009  FINDINGS: Stable apparent cardiomegaly, accentuated by lower mediastinal fat and developmental AP chest narrowing.  Normal aortic and hilar contours. There is no edema, consolidation, effusion, or pneumothorax. Remote T11 superior endplate fracture.  IMPRESSION: No active cardiopulmonary disease.   Electronically Signed   By: Monte Fantasia M.D.   On: 06/19/2014 17:23   Ct Head Wo Contrast  06/19/2014   CLINICAL DATA:  Blurred vision and chest pain.  Syncope.  EXAM: CT HEAD WITHOUT CONTRAST  TECHNIQUE: Contiguous axial images were obtained from the base of the skull through the vertex without intravenous contrast.  COMPARISON:  01/06/2005  FINDINGS: Skull and Sinuses:Negative for fracture or destructive process. The mastoids, middle ears, and imaged paranasal sinuses are clear.  Orbits: Cataract resection bilaterally. No acute findings to explain blurred vision.  Brain: No evidence of acute infarction, hemorrhage, hydrocephalus, or intra-axial mass lesion/mass effect. Stable retro cerebellar CSF accumulation with mild mass effect on the vermis. Cerebral volume loss which is expected for age.  IMPRESSION: Negative head CT.   Electronically Signed   By: Monte Fantasia M.D.   On: 06/19/2014 20:04   Ct Angio Chest Pe W/cm &/or Wo Cm  06/19/2014   CLINICAL DATA:  Chest pain.  EXAM: CT ANGIOGRAPHY CHEST WITH CONTRAST  TECHNIQUE: Multidetector CT imaging of the chest was performed using the standard protocol during bolus administration of intravenous contrast. Multiplanar CT image reconstructions and MIPs were obtained to evaluate the vascular anatomy.  CONTRAST:  130mL OMNIPAQUE IOHEXOL 350 MG/ML SOLN  COMPARISON:  CT scan of chest of June 04, 2009.  FINDINGS: No pneumothorax or pleural effusion is noted. No acute pulmonary disease is noted. There is no evidence of pulmonary embolus. No mediastinal mass or adenopathy is noted. Visualized portion of upper abdomen appears normal. No significant osseous abnormality is noted.  Review of the MIP images confirms the above findings.  IMPRESSION: No evidence of pulmonary embolus.    Electronically Signed   By: Marijo Conception, M.D.   On: 06/19/2014 20:12    Scheduled Meds: . aspirin EC  325 mg Oral Daily  . heparin  5,000 Units Subcutaneous 3 times per day  . sodium chloride  3 mL Intravenous Q12H   Continuous Infusions:     Time spent: 25 minutes    Sachi Boulay, [place]  [facility] Pager [phone]. If 7PM-7AM, please contact night-coverage at [email or web address], password [facility] 06/20/2014, 1:27 PM

## 2014-06-20 NOTE — Progress Notes (Signed)
Pt. Arrived to unit from ED in stable condition. Pt. Alert and oriented. No s/s of distress or discomfort noted. Pt. Denies any pain at this time. Pt. Oriented to room and placed on telemetry. CCMD notified. VSS. On call MD, Hal Hope paged to notify of pts. Arrival to floor. RN will continue to monitor pt. For changes in condition. Clemmie Marxen, Santiago Glad Cherrell]

## 2014-06-21 DIAGNOSIS — R072 Precordial pain: Secondary | ICD-10-CM | POA: Diagnosis not present

## 2014-06-21 DIAGNOSIS — R7989 Other specified abnormal findings of blood chemistry: Secondary | ICD-10-CM

## 2014-06-21 DIAGNOSIS — R55 Syncope and collapse: Secondary | ICD-10-CM | POA: Diagnosis present

## 2014-06-21 DIAGNOSIS — R079 Chest pain, unspecified: Secondary | ICD-10-CM | POA: Diagnosis not present

## 2014-06-21 DIAGNOSIS — I350 Nonrheumatic aortic (valve) stenosis: Secondary | ICD-10-CM | POA: Diagnosis not present

## 2014-06-21 DIAGNOSIS — R778 Other specified abnormalities of plasma proteins: Secondary | ICD-10-CM | POA: Diagnosis present

## 2014-06-21 DIAGNOSIS — R011 Cardiac murmur, unspecified: Secondary | ICD-10-CM | POA: Diagnosis not present

## 2014-06-21 LAB — CBC WITH DIFFERENTIAL/PLATELET
Basophils Absolute: 0.1 10*3/uL (ref 0.0–0.1)
Basophils Relative: 1 % (ref 0–1)
EOS ABS: 0.1 10*3/uL (ref 0.0–0.7)
Eosinophils Relative: 3 % (ref 0–5)
HCT: 36.9 % — ABNORMAL LOW (ref 39.0–52.0)
HEMOGLOBIN: 13.4 g/dL (ref 13.0–17.0)
LYMPHS PCT: 21 % (ref 12–46)
Lymphs Abs: 1 10*3/uL (ref 0.7–4.0)
MCH: 38.3 pg — ABNORMAL HIGH (ref 26.0–34.0)
MCHC: 36.3 g/dL — AB (ref 30.0–36.0)
MCV: 105.4 fL — ABNORMAL HIGH (ref 78.0–100.0)
MONO ABS: 0.5 10*3/uL (ref 0.1–1.0)
MONOS PCT: 10 % (ref 3–12)
NEUTROS PCT: 65 % (ref 43–77)
Neutro Abs: 3.2 10*3/uL (ref 1.7–7.7)
Platelets: 177 10*3/uL (ref 150–400)
RBC: 3.5 MIL/uL — ABNORMAL LOW (ref 4.22–5.81)
RDW: 13.2 % (ref 11.5–15.5)
WBC: 4.9 10*3/uL (ref 4.0–10.5)

## 2014-06-21 LAB — HEMOGLOBIN A1C
Hgb A1c MFr Bld: 5.1 % (ref 4.8–5.6)
MEAN PLASMA GLUCOSE: 100 mg/dL

## 2014-06-21 LAB — COMPREHENSIVE METABOLIC PANEL
ALBUMIN: 3.2 g/dL — AB (ref 3.5–5.2)
ALK PHOS: 57 U/L (ref 39–117)
ALT: 13 U/L (ref 0–53)
ANION GAP: 8 (ref 5–15)
AST: 14 U/L (ref 0–37)
BUN: 11 mg/dL (ref 6–23)
CO2: 24 mmol/L (ref 19–32)
CREATININE: 1.26 mg/dL (ref 0.50–1.35)
Calcium: 8.9 mg/dL (ref 8.4–10.5)
Chloride: 106 mmol/L (ref 96–112)
GFR calc Af Amer: 62 mL/min — ABNORMAL LOW (ref >90)
GFR calc non Af Amer: 54 mL/min — ABNORMAL LOW (ref >90)
Glucose, Bld: 113 mg/dL — ABNORMAL HIGH (ref 70–99)
Potassium: 3.9 mmol/L (ref 3.5–5.1)
Sodium: 138 mmol/L (ref 135–145)
TOTAL PROTEIN: 5.7 g/dL — AB (ref 6.0–8.3)
Total Bilirubin: 1 mg/dL (ref 0.3–1.2)

## 2014-06-21 MED ORDER — SODIUM CHLORIDE 0.9 % IV SOLN
INTRAVENOUS | Status: DC
  Administered 2014-06-22: 75 mL/h via INTRAVENOUS

## 2014-06-21 NOTE — Progress Notes (Signed)
UR completed 

## 2014-06-21 NOTE — Progress Notes (Signed)
[facility] PROGRESS NOTE  Salem Lembke IPJ:[id number] DOB: 03/20/1935 DOA: 06/19/2014 PCP: Jani Gravel, MD  Brief narrative 79 year old male with no prior medical history ( reports having a murmur since childhood) presented with chest pain after he was working in his yard and lifting the water hose. The pain was substernal and radiating across the chest lasting for a few minutes. As per the patient's wife he also complained of some dizziness and transient visual disturbance. The chest pain subsided after EMS arrived. In the ED EKG showed bigeminal rhythm and T-wave inversion in lateral leads with minimally elevated troponin. BNP was 1218 and mildly elevated d-dimer. Head CT was unremarkable a CT angiogram was negative for PE. Patient admitted to hospitalist service on telemetry. Cardiology consulted.   Assessment/Plan: Chest pain with elevated troponin -Stable on telemetry without further symptoms. Troponin  peaked at 0.6 .  EKG changes with T-wave inversion in lateral leads. significant systolic murmur . cardiology is concerning for moderate to severe aortic stenosis. Also his transient visual symptoms could be related to cerebral hypoperfusion from underlying severe AS. 2-D echo results pending. - continue full dose aspirin and S/l nitrate. Avoid afterload reducing agents for now. Lipid panel normal -Cardiology plan on right and left heart catheterization.  Diet: Heart healthy  DVT prophylaxis: Subcutaneous heparin  Code Status:full code  Communication: wife at bedside  Disposition Plan: Pending 2-D echo results and further decision per cardiology  Consultants:  cardiology  Procedures:  2d echo   Antibiotics: None  HPI/Subjective  patient seen and examined. For the chest pain symptoms. Frustrated that he could not get echo yesterday  Objective: Filed Vitals:   06/21/14 1037  BP: 130/94  Pulse: 72  Temp:   Resp: 18    Intake/Output Summary (Last 24 hours) at  06/21/14 1122 Last data filed at 06/21/14 0907  Gross per 24 hour  Intake    840 ml  Output      0 ml  Net    840 ml   Filed Weights   06/19/14 1602 06/19/14 2256 06/21/14 0500  Weight: 88.451 kg (195 lb) 87.544 kg (193 lb) 88.27 kg (194 lb 9.6 oz)    Exam:   General:  Elderly male in no acute distress  HEENT: No pallor, moist oral mucosa, supple neck, no JVD  Cardiovascular: normal B3&A1, systolic murmur 4/6 more prominent over left second intercostal space  Chest: Clear to auscultation bilaterally   Abdomen: soft, NT, ND  Musculoskeletal: Warm, no edema    Data Reviewed: Basic Metabolic Panel:  Recent Labs Lab 06/19/14 1630 06/19/14 2134 06/20/14 0239 06/21/14 0439  NA 134*  --  137 138  K 4.2  --  4.2 3.9  CL 102  --  104 106  CO2 23  --  29 24  GLUCOSE 97  --  100* 113*  BUN 10  --  8 11  CREATININE 1.27 1.18 1.24 1.26  CALCIUM 8.8  --  8.7 8.9   Liver Function Tests:  Recent Labs Lab 06/20/14 0239 06/21/14 0439  AST 23 14  ALT 16 13  ALKPHOS 59 57  BILITOT 1.1 1.0  PROT 6.6 5.7*  ALBUMIN 3.6 3.2*   No results for input(s): LIPASE, AMYLASE in the last 168 hours. No results for input(s): AMMONIA in the last 168 hours. CBC:  Recent Labs Lab 06/19/14 1630 06/19/14 2134 06/20/14 0239 06/21/14 0439  WBC 5.8 5.6 5.1 4.9  NEUTROABS 3.9  --  3.1 3.2  HGB 14.0  13.8 14.3 13.4  HCT 39.4 38.1* 40.3 36.9*  MCV 100.0 99.5 102.5* 105.4*  PLT 172 168 174 177   Cardiac Enzymes:  Recent Labs Lab 06/19/14 1630 06/19/14 2134 06/20/14 0239 06/20/14 1034  TROPONINI 0.05* 0.06* 0.05* 0.05*   BNP (last 3 results)  Recent Labs  06/19/14 1630  BNP 1282.9*    ProBNP (last 3 results) No results for input(s): PROBNP in the last 8760 hours.  CBG: No results for input(s): GLUCAP in the last 168 hours.  No results found for this or any previous visit (from the past 240 hour(s)).   Studies: Dg Chest 2 View  06/19/2014   CLINICAL DATA:  Mid  lower chest pain, onset today. Intermittent shortness of breath.  EXAM: CHEST  2 VIEW  COMPARISON:  Chest CT 06/04/2009  FINDINGS: Stable apparent cardiomegaly, accentuated by lower mediastinal fat and developmental AP chest narrowing. Normal aortic and hilar contours. There is no edema, consolidation, effusion, or pneumothorax. Remote T11 superior endplate fracture.  IMPRESSION: No active cardiopulmonary disease.   Electronically Signed   By: Monte Fantasia M.D.   On: 06/19/2014 17:23   Ct Head Wo Contrast  06/19/2014   CLINICAL DATA:  Blurred vision and chest pain.  Syncope.  EXAM: CT HEAD WITHOUT CONTRAST  TECHNIQUE: Contiguous axial images were obtained from the base of the skull through the vertex without intravenous contrast.  COMPARISON:  01/06/2005  FINDINGS: Skull and Sinuses:Negative for fracture or destructive process. The mastoids, middle ears, and imaged paranasal sinuses are clear.  Orbits: Cataract resection bilaterally. No acute findings to explain blurred vision.  Brain: No evidence of acute infarction, hemorrhage, hydrocephalus, or intra-axial mass lesion/mass effect. Stable retro cerebellar CSF accumulation with mild mass effect on the vermis. Cerebral volume loss which is expected for age.  IMPRESSION: Negative head CT.   Electronically Signed   By: Monte Fantasia M.D.   On: 06/19/2014 20:04   Ct Angio Chest Pe W/cm &/or Wo Cm  06/19/2014   CLINICAL DATA:  Chest pain.  EXAM: CT ANGIOGRAPHY CHEST WITH CONTRAST  TECHNIQUE: Multidetector CT imaging of the chest was performed using the standard protocol during bolus administration of intravenous contrast. Multiplanar CT image reconstructions and MIPs were obtained to evaluate the vascular anatomy.  CONTRAST:  153mL OMNIPAQUE IOHEXOL 350 MG/ML SOLN  COMPARISON:  CT scan of chest of June 04, 2009.  FINDINGS: No pneumothorax or pleural effusion is noted. No acute pulmonary disease is noted. There is no evidence of pulmonary embolus. No  mediastinal mass or adenopathy is noted. Visualized portion of upper abdomen appears normal. No significant osseous abnormality is noted.  Review of the MIP images confirms the above findings.  IMPRESSION: No evidence of pulmonary embolus.   Electronically Signed   By: Marijo Conception, M.D.   On: 06/19/2014 20:12    Scheduled Meds: . aspirin EC  325 mg Oral Daily  . heparin  5,000 Units Subcutaneous 3 times per day  . sodium chloride  3 mL Intravenous Q12H   Continuous Infusions:     Time spent: 20 minutes    Adelia Baptista, [place]  [facility] Pager [phone]. If 7PM-7AM, please contact night-coverage at [email or web address], password [facility] 06/21/2014, 11:22 AM

## 2014-06-21 NOTE — Progress Notes (Signed)
Patient Name: Erik [name] Date of Encounter: 06/21/2014   Principal Problem:   Chest pain Active Problems:   Heart murmur   Pre-syncope   Elevated troponin   SUBJECTIVE  Denies chest pain, palpitations, shortness of breath, dizziness, lightheadedness, visual changes. Reports no complaints today.  CURRENT MEDS . aspirin EC  325 mg Oral Daily  . heparin  5,000 Units Subcutaneous 3 times per day  . sodium chloride  3 mL Intravenous Q12H    OBJECTIVE  Filed Vitals:   06/20/14 2040 06/21/14 0100 06/21/14 0500 06/21/14 1037  BP: 126/60 91/52 117/70 130/94  Pulse: 62 64 83 72  Temp: 97.6 F (36.4 C) 98.2 F (36.8 C) 98.2 F (36.8 C)   TempSrc: Oral Oral Oral   Resp: 18 18 18 18   Height:      Weight:   194 lb 9.6 oz (88.27 kg)   SpO2: 99% 98% 99% 99%    Intake/Output Summary (Last 24 hours) at 06/21/14 1104 Last data filed at 06/21/14 0907  Gross per 24 hour  Intake    840 ml  Output      0 ml  Net    840 ml   Filed Weights   06/19/14 1602 06/19/14 2256 06/21/14 0500  Weight: 195 lb (88.451 kg) 193 lb (87.544 kg) 194 lb 9.6 oz (88.27 kg)   PHYSICAL EXAM  General: Pleasant, NAD. Neuro: Alert and oriented X 3. Moves all extremities spontaneously. Psych: Normal affect. HEENT: Normal  Neck: Supple without bruits or JVD. Lungs:  Resp regular and unlabored, CTA. Heart: RRR. 3/6 systolic murmur loudest at RUSB. No audible S2.  No s3, s4. Abdomen: Soft, non-tender, non-distended, BS + x 4.  Extremities: No clubbing, cyanosis or edema. DP/PT/Radials 2+ and equal bilaterally.  Accessory Clinical Findings  CBC  Recent Labs  06/20/14 0239 06/21/14 0439  WBC 5.1 4.9  NEUTROABS 3.1 3.2  HGB 14.3 13.4  HCT 40.3 36.9*  MCV 102.5* 105.4*  PLT 174 330   Basic Metabolic Panel  Recent Labs  06/20/14 0239 06/21/14 0439  NA 137 138  K 4.2 3.9  CL 104 106  CO2 29 24  GLUCOSE 100* 113*  BUN 8 11  CREATININE 1.24 1.26  CALCIUM 8.7 8.9   Liver Function  Tests  Recent Labs  06/20/14 0239 06/21/14 0439  AST 23 14  ALT 16 13  ALKPHOS 59 57  BILITOT 1.1 1.0  PROT 6.6 5.7*  ALBUMIN 3.6 3.2*   Cardiac Enzymes  Recent Labs  06/19/14 2134 06/20/14 0239 06/20/14 1034  TROPONINI 0.06* 0.05* 0.05*   D-Dimer  Recent Labs  06/19/14 1630  DDIMER 0.66*   Hemoglobin A1C  Recent Labs  06/19/14 2103  HGBA1C 5.1   Fasting Lipid Panel  Recent Labs  06/19/14 2134  CHOL 135  HDL 35*  LDLCALC 88  TRIG 60  CHOLHDL 3.9   Thyroid Function Tests  Recent Labs  06/19/14 2103  TSH 2.924   TELE  RSR, 1st deg avb, freq pac's.  Radiology/Studies  Dg Chest 2 View  06/19/2014   CLINICAL DATA:  Mid lower chest pain, onset today. Intermittent shortness of breath.  EXAM: CHEST  2 VIEW  COMPARISON:  Chest CT 06/04/2009  FINDINGS: Stable apparent cardiomegaly, accentuated by lower mediastinal fat and developmental AP chest narrowing. Normal aortic and hilar contours. There is no edema, consolidation, effusion, or pneumothorax. Remote T11 superior endplate fracture.  IMPRESSION: No active cardiopulmonary disease.   Electronically Signed  By: Monte Fantasia M.D.   On: 06/19/2014 17:23   Ct Head Wo Contrast  06/19/2014   CLINICAL DATA:  Blurred vision and chest pain.  Syncope.  EXAM: CT HEAD WITHOUT CONTRAST  TECHNIQUE: Contiguous axial images were obtained from the base of the skull through the vertex without intravenous contrast.  COMPARISON:  01/06/2005  FINDINGS: Skull and Sinuses:Negative for fracture or destructive process. The mastoids, middle ears, and imaged paranasal sinuses are clear.  Orbits: Cataract resection bilaterally. No acute findings to explain blurred vision.  Brain: No evidence of acute infarction, hemorrhage, hydrocephalus, or intra-axial mass lesion/mass effect. Stable retro cerebellar CSF accumulation with mild mass effect on the vermis. Cerebral volume loss which is expected for age.  IMPRESSION: Negative head CT.    Electronically Signed   By: Monte Fantasia M.D.   On: 06/19/2014 20:04   Ct Angio Chest Pe W/cm &/or Wo Cm  06/19/2014   CLINICAL DATA:  Chest pain.  EXAM: CT ANGIOGRAPHY CHEST WITH CONTRAST  TECHNIQUE: Multidetector CT imaging of the chest was performed using the standard protocol during bolus administration of intravenous contrast. Multiplanar CT image reconstructions and MIPs were obtained to evaluate the vascular anatomy.  CONTRAST:  145mL OMNIPAQUE IOHEXOL 350 MG/ML SOLN  COMPARISON:  CT scan of chest of June 04, 2009.  FINDINGS: No pneumothorax or pleural effusion is noted. No acute pulmonary disease is noted. There is no evidence of pulmonary embolus. No mediastinal mass or adenopathy is noted. Visualized portion of upper abdomen appears normal. No significant osseous abnormality is noted.  Review of the MIP images confirms the above findings.  IMPRESSION: No evidence of pulmonary embolus.   Electronically Signed   By: Marijo Conception, M.D.   On: 06/19/2014 20:12   ASSESSMENT AND PLAN  1. Chest pain/Elevated troponin - Patient denies chest pain this morning. Mildly elevated troponin with flat trend (0.05, 0.06, 0.05, 0.05). Continue ASA, change to 81 mg daily.   2. Heart murmur - Physical exam findings consistent with aortic stenosis. He's had a murmur since the first grade but has never had an echo.  ? Bicuspid valve.  Echo pending. Will likely need right and left cardiac catheterization to assess coronary and valvular anatomy tomorrow.  3. Pre-syncope - Patient denies dizziness, lightheadedness, or visual changes this morning. Pre-syncopal episode prior to admit likely secondary to finding of heart murmur consistent with aortic stenosis.  Signed, Murray Hodgkins NP

## 2014-06-21 NOTE — Progress Notes (Signed)
  Echocardiogram 2D Echocardiogram has been performed.  Darlina Sicilian M 06/21/2014, 12:05 PM

## 2014-06-21 NOTE — Progress Notes (Signed)
Notified Dr. Debara Pickett of pt Erik [name]. Pt in stable condition. No concerns at this time. Will continue to monitor.

## 2014-06-22 ENCOUNTER — Encounter (HOSPITAL_COMMUNITY): Payer: Self-pay | Admitting: Cardiovascular Disease

## 2014-06-22 ENCOUNTER — Encounter (HOSPITAL_COMMUNITY)
Admission: EM | Disposition: A | Discharge status: Home / Self Care | Payer: Self-pay | Source: Home / Self Care | Attending: Emergency Medicine

## 2014-06-22 DIAGNOSIS — I350 Nonrheumatic aortic (valve) stenosis: Secondary | ICD-10-CM | POA: Diagnosis not present

## 2014-06-22 DIAGNOSIS — R0789 Other chest pain: Secondary | ICD-10-CM

## 2014-06-22 HISTORY — PX: LEFT AND RIGHT HEART CATHETERIZATION WITH CORONARY ANGIOGRAM: SHX5449

## 2014-06-22 LAB — CBC WITH DIFFERENTIAL/PLATELET
BASOS PCT: 2 % — AB (ref 0–1)
Basophils Absolute: 0.1 10*3/uL (ref 0.0–0.1)
Eosinophils Absolute: 0.2 10*3/uL (ref 0.0–0.7)
Eosinophils Relative: 3 % (ref 0–5)
HEMATOCRIT: 36.1 % — AB (ref 39.0–52.0)
HEMOGLOBIN: 13.1 g/dL (ref 13.0–17.0)
LYMPHS ABS: 1.1 10*3/uL (ref 0.7–4.0)
Lymphocytes Relative: 21 % (ref 12–46)
MCH: 38.1 pg — ABNORMAL HIGH (ref 26.0–34.0)
MCHC: 36.3 g/dL — ABNORMAL HIGH (ref 30.0–36.0)
MCV: 104.9 fL — ABNORMAL HIGH (ref 78.0–100.0)
MONOS PCT: 11 % (ref 3–12)
Monocytes Absolute: 0.5 10*3/uL (ref 0.1–1.0)
NEUTROS ABS: 3.3 10*3/uL (ref 1.7–7.7)
Neutrophils Relative %: 63 % (ref 43–77)
Platelets: 171 10*3/uL (ref 150–400)
RBC: 3.44 MIL/uL — ABNORMAL LOW (ref 4.22–5.81)
RDW: 13 % (ref 11.5–15.5)
WBC: 5.1 10*3/uL (ref 4.0–10.5)

## 2014-06-22 LAB — POCT I-STAT 3, ART BLOOD GAS (G3+)
Acid-base deficit: 4 mmol/L — ABNORMAL HIGH (ref 0.0–2.0)
Bicarbonate: 20.6 mEq/L (ref 20.0–24.0)
O2 Saturation: 92 %
PCO2 ART: 34 mmHg — AB (ref 35.0–45.0)
PH ART: 7.389 (ref 7.350–7.450)
TCO2: 22 mmol/L (ref 0–100)
pO2, Arterial: 64 mmHg — ABNORMAL LOW (ref 80.0–100.0)

## 2014-06-22 LAB — COMPREHENSIVE METABOLIC PANEL
ALBUMIN: 3.2 g/dL — AB (ref 3.5–5.2)
ALK PHOS: 57 U/L (ref 39–117)
ALT: 15 U/L (ref 0–53)
ANION GAP: 8 (ref 5–15)
AST: 17 U/L (ref 0–37)
BILIRUBIN TOTAL: 1.3 mg/dL — AB (ref 0.3–1.2)
BUN: 7 mg/dL (ref 6–23)
CHLORIDE: 106 mmol/L (ref 96–112)
CO2: 24 mmol/L (ref 19–32)
Calcium: 8.7 mg/dL (ref 8.4–10.5)
Creatinine, Ser: 1.22 mg/dL (ref 0.50–1.35)
GFR calc Af Amer: 65 mL/min — ABNORMAL LOW (ref >90)
GFR calc non Af Amer: 56 mL/min — ABNORMAL LOW (ref >90)
Glucose, Bld: 100 mg/dL — ABNORMAL HIGH (ref 70–99)
Potassium: 4 mmol/L (ref 3.5–5.1)
Sodium: 138 mmol/L (ref 135–145)
Total Protein: 6.6 g/dL (ref 6.0–8.3)

## 2014-06-22 LAB — POCT I-STAT 3, VENOUS BLOOD GAS (G3P V)
Acid-base deficit: 3 mmol/L — ABNORMAL HIGH (ref 0.0–2.0)
BICARBONATE: 21.6 meq/L (ref 20.0–24.0)
O2 Saturation: 63 %
PH VEN: 7.372 — AB (ref 7.250–7.300)
PO2 VEN: 33 mmHg (ref 30.0–45.0)
TCO2: 23 mmol/L (ref 0–100)
pCO2, Ven: 37.3 mmHg — ABNORMAL LOW (ref 45.0–50.0)

## 2014-06-22 LAB — PROTIME-INR
INR: 1.13 (ref 0.00–1.49)
PROTHROMBIN TIME: 14.6 s (ref 11.6–15.2)

## 2014-06-22 SURGERY — LEFT AND RIGHT HEART CATHETERIZATION WITH CORONARY ANGIOGRAM

## 2014-06-22 MED ORDER — HEPARIN SODIUM (PORCINE) 5000 UNIT/ML IJ SOLN
5000.0000 [IU] | Freq: Three times a day (TID) | INTRAMUSCULAR | Status: DC
Start: 2014-06-23 — End: 2014-06-22

## 2014-06-22 MED ORDER — SODIUM CHLORIDE 0.9 % IJ SOLN: 3.0000 mL | INTRAMUSCULAR | Status: DC | PRN

## 2014-06-22 MED ORDER — SODIUM CHLORIDE 0.9 % IV SOLN: INTRAVENOUS | Status: DC

## 2014-06-22 MED ORDER — ONDANSETRON HCL 4 MG/2ML IJ SOLN: 4.0000 mg | Freq: Four times a day (QID) | INTRAMUSCULAR | Status: DC | PRN

## 2014-06-22 MED ORDER — ASPIRIN 81 MG PO CHEW
81.0000 mg | CHEWABLE_TABLET | ORAL | Status: AC
  Administered 2014-06-22: 81 mg via ORAL
  Filled 2014-06-22: qty 1

## 2014-06-22 MED ORDER — ASPIRIN EC 81 MG PO TBEC
81.0000 mg | DELAYED_RELEASE_TABLET | Freq: Every day | ORAL | Status: DC
  Administered 2014-06-22 – 2014-06-23 (×2): 81 mg via ORAL
  Filled 2014-06-22 (×3): qty 1

## 2014-06-22 MED ORDER — SODIUM CHLORIDE 0.9 % IJ SOLN
3.0000 mL | Freq: Two times a day (BID) | INTRAMUSCULAR | Status: DC
  Administered 2014-06-22 (×2): 3 mL via INTRAVENOUS

## 2014-06-22 MED ORDER — LIDOCAINE HCL (PF) 1 % IJ SOLN
INTRAMUSCULAR | Status: AC
  Filled 2014-06-22: qty 30

## 2014-06-22 MED ORDER — SODIUM CHLORIDE 0.9 % IV SOLN: 250.0000 mL | INTRAVENOUS | Status: DC | PRN

## 2014-06-22 MED ORDER — FENTANYL CITRATE 0.05 MG/ML IJ SOLN
INTRAMUSCULAR | Status: AC
  Filled 2014-06-22: qty 2

## 2014-06-22 MED ORDER — ACETAMINOPHEN 325 MG PO TABS: 650.0000 mg | ORAL_TABLET | ORAL | Status: DC | PRN

## 2014-06-22 MED ORDER — MIDAZOLAM HCL 2 MG/2ML IJ SOLN
INTRAMUSCULAR | Status: AC
  Filled 2014-06-22: qty 2

## 2014-06-22 MED ORDER — HEPARIN (PORCINE) IN NACL 2-0.9 UNIT/ML-% IJ SOLN
INTRAMUSCULAR | Status: AC
  Filled 2014-06-22: qty 1000

## 2014-06-22 NOTE — Progress Notes (Signed)
Spoke with Kerin Ransom PA regarding aspirin dose due at 1000. He stated it is okay to hold dose since pt received 81 mg aspirin this AM.

## 2014-06-22 NOTE — Progress Notes (Signed)
Patient Name: Erik [name] Date of Encounter: 06/22/2014    Principal Problem:   Chest pain Active Problems:   Aortic stenosis, severe   Elevated troponin   Pre-syncope   Primary Cardiologist: Dr Claiborne Billings (new)  Patient Profile: 79 yo male w/ no prev cardiac issues (hx SEM), no hx HTN, DM, HL, was admitted 04/05 w/ chest pain, severe AS by echo. Cath 04/07  SUBJECTIVE: No chest pain, no SOB  OBJECTIVE Filed Vitals:   06/21/14 0100 06/21/14 0500 06/21/14 1037 06/21/14 2151  BP: 91/52 117/70 130/94 97/56  Pulse: 64 83 72 63  Temp: 98.2 F (36.8 C) 98.2 F (36.8 C)  97.8 F (36.6 C)  TempSrc: Oral Oral  Oral  Resp: 18 18 18 16   Height:      Weight:  194 lb 9.6 oz (88.27 kg)    SpO2: 98% 99% 99% 99%    Intake/Output Summary (Last 24 hours) at 06/22/14 0646 Last data filed at 06/21/14 2300  Gross per 24 hour  Intake    720 ml  Output      0 ml  Net    720 ml   Filed Weights   06/19/14 1602 06/19/14 2256 06/21/14 0500  Weight: 195 lb (88.451 kg) 193 lb (87.544 kg) 194 lb 9.6 oz (88.27 kg)    PHYSICAL EXAM General: Well developed, well nourished, male in no acute distress. Head: Normocephalic, atraumatic.  Neck: Supple without bruits, JVD not elevated. Lungs:  Resp regular and unlabored, CTA. Heart: RRR, S1, S2 is decreased, no S3, S4, 3/6 murmur; no rub. Abdomen: Soft, non-tender, non-distended, BS + x 4.  Extremities: No clubbing, cyanosis, no edema.  Neuro: Alert and oriented X 3. Moves all extremities spontaneously. Psych: Normal affect.  LABS: CBC:  Recent Labs  06/21/14 0439 06/22/14 0532  WBC 4.9 5.1  NEUTROABS 3.2 3.3  HGB 13.4 13.1  HCT 36.9* 36.1*  MCV 105.4* 104.9*  PLT 177 171   INR:  Recent Labs  06/22/14 0539  INR 7.82   Basic Metabolic Panel:  Recent Labs  06/20/14 0239 06/21/14 0439  NA 137 138  K 4.2 3.9  CL 104 106  CO2 29 24  GLUCOSE 100* 113*  BUN 8 11  CREATININE 1.24 1.26  CALCIUM 8.7 8.9   Liver Function  Tests:  Recent Labs  06/20/14 0239 06/21/14 0439  AST 23 14  ALT 16 13  ALKPHOS 59 57  BILITOT 1.1 1.0  PROT 6.6 5.7*  ALBUMIN 3.6 3.2*   Cardiac Enzymes:  Recent Labs  06/19/14 2134 06/20/14 0239 06/20/14 1034  TROPONINI 0.06* 0.05* 0.05*   BNP:  B NATRIURETIC PEPTIDE  Date/Time Value Ref Range Status  06/19/2014 04:30 PM 1282.9* 0.0 - 100.0 pg/mL Final   D-dimer:  Recent Labs  06/19/14 1630  DDIMER 0.66*   Hemoglobin A1C:  Recent Labs  06/19/14 2103  HGBA1C 5.1   Fasting Lipid Panel:  Recent Labs  06/19/14 2134  CHOL 135  HDL 35*  LDLCALC 88  TRIG 60  CHOLHDL 3.9   Thyroid Function Tests:  Recent Labs  06/19/14 2103  TSH 2.924   TELE:   SR, S brady, bigeminal PACs, unusual P wave    Radiology/Studies: Dg Chest 2 View 06/19/2014   CLINICAL DATA:  Mid lower chest pain, onset today. Intermittent shortness of breath.  EXAM: CHEST  2 VIEW  COMPARISON:  Chest CT 06/04/2009  FINDINGS: Stable apparent cardiomegaly, accentuated by lower mediastinal fat and developmental  AP chest narrowing. Normal aortic and hilar contours. There is no edema, consolidation, effusion, or pneumothorax. Remote T11 superior endplate fracture.  IMPRESSION: No active cardiopulmonary disease.   Electronically Signed   By: Monte Fantasia M.D.   On: 06/19/2014 17:23   Ct Head Wo Contrast 06/19/2014   CLINICAL DATA:  Blurred vision and chest pain.  Syncope.  EXAM: CT HEAD WITHOUT CONTRAST  TECHNIQUE: Contiguous axial images were obtained from the base of the skull through the vertex without intravenous contrast.  COMPARISON:  01/06/2005  FINDINGS: Skull and Sinuses:Negative for fracture or destructive process. The mastoids, middle ears, and imaged paranasal sinuses are clear.  Orbits: Cataract resection bilaterally. No acute findings to explain blurred vision.  Brain: No evidence of acute infarction, hemorrhage, hydrocephalus, or intra-axial mass lesion/mass effect. Stable retro  cerebellar CSF accumulation with mild mass effect on the vermis. Cerebral volume loss which is expected for age.  IMPRESSION: Negative head CT.   Electronically Signed   By: Monte Fantasia M.D.   On: 06/19/2014 20:04   Ct Angio Chest Pe W/cm &/or Wo Cm 06/19/2014   CLINICAL DATA:  Chest pain.  EXAM: CT ANGIOGRAPHY CHEST WITH CONTRAST  TECHNIQUE: Multidetector CT imaging of the chest was performed using the standard protocol during bolus administration of intravenous contrast. Multiplanar CT image reconstructions and MIPs were obtained to evaluate the vascular anatomy.  CONTRAST:  160mL OMNIPAQUE IOHEXOL 350 MG/ML SOLN  COMPARISON:  CT scan of chest of June 04, 2009.  FINDINGS: No pneumothorax or pleural effusion is noted. No acute pulmonary disease is noted. There is no evidence of pulmonary embolus. No mediastinal mass or adenopathy is noted. Visualized portion of upper abdomen appears normal. No significant osseous abnormality is noted.  Review of the MIP images confirms the above findings.  IMPRESSION: No evidence of pulmonary embolus.   Electronically Signed   By: Marijo Conception, M.D.   On: 06/19/2014 20:12    Current Medications:  . aspirin EC  325 mg Oral Daily  . heparin  5,000 Units Subcutaneous 3 times per day  . sodium chloride  3 mL Intravenous Q12H  . sodium chloride  3 mL Intravenous Q12H   . sodium chloride 75 mL/hr (06/22/14 0539)    ASSESSMENT AND PLAN: Principal Problem:   Chest pain - minimal elevation in ez - for cath today, answered questions and addressed concerns  Active Problems:   Aortic stenosis, severe - per echo 04/06 - cath will help w/ plan    Elevated troponin - cath today, probably demand ischemia    Pre-syncope - may be 2nd AS - bradycardia w/ HR 50s might cause in conjunction w/ AS  Signed, Rosaria Ferries , PA-C 6:46 AM 06/22/2014

## 2014-06-22 NOTE — Progress Notes (Signed)
[facility] PROGRESS NOTE  Ha Placeres IBB:[id number] DOB: 05-01-1935 DOA: 06/19/2014 PCP: Jani Gravel, MD  Brief narrative 79 year old male with no prior medical history ( reports having a murmur since childhood) presented with chest pain after he was working in his yard and lifting the water hose. The pain was substernal and radiating across the chest lasting for a few minutes. As per the patient's wife he also complained of some dizziness and transient visual disturbance. The chest pain subsided after EMS arrived. In the ED EKG showed bigeminal rhythm and T-wave inversion in lateral leads with minimally elevated troponin. BNP was 1218 and mildly elevated d-dimer. Head CT was unremarkable a CT angiogram was negative for PE. Patient admitted to hospitalist service on telemetry. Cardiology consulted.   Assessment/Plan: Chest pain with elevated troponin -no further symptoms. Troponin peaked at 0.6.  EKG changes with T-wave inversion in lateral leads. significant systolic murmur .  -critical  AS on 2 D echo which likely caused dizziness and Transient visual symptoms. -Avoid afterload reducing agents. Lipid panel normal. -heart cath today.  Diet: NPO for cath  DVT prophylaxis: Subcutaneous heparin  Code Status:full code  Communication: wife at bedside  Disposition Plan: Pending cath results   Consultants:  cardiology  Procedures:  2d echo   Antibiotics: None  HPI/Subjective  patient seen and examined. No chest pain  Objective: Filed Vitals:   06/22/14 1111  BP: 92/64  Pulse: 79  Temp:   Resp:     Intake/Output Summary (Last 24 hours) at 06/22/14 1137 Last data filed at 06/22/14 1051  Gross per 24 hour  Intake    480 ml  Output    225 ml  Net    255 ml   Filed Weights   06/19/14 2256 06/21/14 0500 06/22/14 0709  Weight: 87.544 kg (193 lb) 88.27 kg (194 lb 9.6 oz) 85.367 kg (188 lb 3.2 oz)    Exam:   General:   no acute distress  HEENT:  moist oral  mucosa,  no JVD  Cardiovascular: normal I5&W3, systolic murmur 4/6 more prominent over left second intercostal space  Chest: Clear bilaterally   Abdomen: soft, NT, ND  Musculoskeletal: , no edema    Data Reviewed: Basic Metabolic Panel:  Recent Labs Lab 06/19/14 1630 06/19/14 2134 06/20/14 0239 06/21/14 0439 06/22/14 0532  NA 134*  --  137 138 138  K 4.2  --  4.2 3.9 4.0  CL 102  --  104 106 106  CO2 23  --  29 24 24   GLUCOSE 97  --  100* 113* 100*  BUN 10  --  8 11 7   CREATININE 1.27 1.18 1.24 1.26 1.22  CALCIUM 8.8  --  8.7 8.9 8.7   Liver Function Tests:  Recent Labs Lab 06/20/14 0239 06/21/14 0439 06/22/14 0532  AST 23 14 17   ALT 16 13 15   ALKPHOS 59 57 57  BILITOT 1.1 1.0 1.3*  PROT 6.6 5.7* 6.6  ALBUMIN 3.6 3.2* 3.2*   No results for input(s): LIPASE, AMYLASE in the last 168 hours. No results for input(s): AMMONIA in the last 168 hours. CBC:  Recent Labs Lab 06/19/14 1630 06/19/14 2134 06/20/14 0239 06/21/14 0439 06/22/14 0532  WBC 5.8 5.6 5.1 4.9 5.1  NEUTROABS 3.9  --  3.1 3.2 3.3  HGB 14.0 13.8 14.3 13.4 13.1  HCT 39.4 38.1* 40.3 36.9* 36.1*  MCV 100.0 99.5 102.5* 105.4* 104.9*  PLT 172 168 174 177 171   Cardiac Enzymes:  Recent Labs Lab 06/19/14 1630 06/19/14 2134 06/20/14 0239 06/20/14 1034  TROPONINI 0.05* 0.06* 0.05* 0.05*   BNP (last 3 results)  Recent Labs  06/19/14 1630  BNP 1282.9*    ProBNP (last 3 results) No results for input(s): PROBNP in the last 8760 hours.  CBG: No results for input(s): GLUCAP in the last 168 hours.  No results found for this or any previous visit (from the past 240 hour(s)).   Studies: No results found.  Scheduled Meds: . aspirin EC  325 mg Oral Daily  . heparin  5,000 Units Subcutaneous 3 times per day  . sodium chloride  3 mL Intravenous Q12H  . sodium chloride  3 mL Intravenous Q12H   Continuous Infusions: . sodium chloride 75 mL/hr (06/22/14 0539)      Time spent: 20  minutes    Taleeya Blondin, [place]  [facility] Pager [phone]. If 7PM-7AM, please contact night-coverage at [email or web address], password [facility] 06/22/2014, 11:37 AM

## 2014-06-22 NOTE — Progress Notes (Signed)
Site area: RFA/RFV Site Prior to Removal:  Level 0 Pressure Applied For:73min Manual:   yes Patient Status During Pull:  stable Post Pull Site:  Level 0 Post Pull Instructions Given:  yes Post Pull Pulses Present: palpable Dressing Applied:  clear Bedrest begins @ 1700 Comments:

## 2014-06-22 NOTE — Interval H&P Note (Signed)
History and Physical Interval Note:  06/22/2014 3:23 PM  Erik [name]  has presented today for surgery, with the diagnosis of cp  The various methods of treatment have been discussed with the patient and family. After consideration of risks, benefits and other options for treatment, the patient has consented to  Procedure(s): LEFT AND RIGHT HEART CATHETERIZATION WITH CORONARY ANGIOGRAM (N/A) as a surgical intervention .  The patient's history has been reviewed, patient examined, no change in status, stable for surgery.  I have reviewed the patient's chart and labs.  Questions were answered to the patient's satisfaction.     [name]

## 2014-06-22 NOTE — CV Procedure (Signed)
Erik [name] is a 79 y.o. male   [id number]  [id number] LOCATION:  FACILITY: [facility]  PHYSICIAN: Troy Sine, MD, [facility] 1935-06-20   DATE OF PROCEDURE:  06/22/2014      RIGHT AND LEFT HEART CARDIAC CATHETERIZATION   HISTORY:  Erik [name] is a 79 y.o. male recently experiencing a brief episode of chest discomfort associated with presyncope with transient visual blurring.  Physical examination suggested a murmur of severe aortic stenosis.  An echo Doppler study revealed critical aortic stenosis.  He is now referred for right and left heart catheterization.   PROCEDURE:  Right and left heart catheterization: Swan-Ganz catheterization, cardiac Determination by the thermodilution and Fick methods, coronary angiography, left ventriculography, supravalvular aortography.  The patient was brought to the second floor [facility] cath lab in the postabsorptive state. Versed 2 mg and fentanyl 50 mcg were administered for conscious sedation. The right groin was prepped and draped in sterile fashion and a 5 [place] arterial sheath and 7 French venous sheath were inserted without difficulty. A Swan-Ganz catheter was advanced into the venous sheath and pressures were obtained in the right atrium, right ventricle, pulmonary artery, and pulmonary capillary wedge position. Cardiac outputs were obtained by the thermodilution and assumed Fick methods. Oxygen saturation was obtained in the pulmonary artery and aorta. A pigtail catheter was inserted and simultaneous AO/PA pressures were recorded.  Supravalvular aortography was performed.  The pigtail catheter was then removed and diagnostic catheterization to delineate the coronary anatomy was performed utilizing 5 French Judkins 4 left and right diagnostic catheters.  With the RCA catheter and with mobility to one leaflet clearly visible.  One brief attempt was made to cross the valve and this was able to be done fairly promptly without difficulty utilizing a  straight wire.  The RCA catheter was then exchanged for a pigtail catheter.  Simultaneous FA and LV pressures and  left ventricular and PCW pressures were recorded. Left ventriculography was performed using 25 cc of contrast at a flow rate of 12 cc/s.  A careful LV to AO pullback was performed.  Simultaneous FA  and AO pressures were recorded, which revealed a 10 mm difference between the higher FA pressure in the central aorta. All catheters were removed and the patient. Hemostasis was obtained by direct manual pressure. The patient tolerated the procedure well and returned to his room in satisfactory condition.   HEMODYNAMICS:   RA: a 4  Mean 3 RV: 30/6 PA: 29/12 PC: 18,  V 19; mean 15  AO: 105/56 PA: 33/12   LV: 178/29 AO: 109/58     Oxygen saturation in the aorta 92% and the pulmonary artery 63%  Cardiac output: 4 l/min (Thermo); 5.5 (Fick)  Cardiac index: 1.9 l/m/m2                2.6  AV: peak to peak 66-69 mm HG;  Mean 44.2  AVA 0.58 cm2  ANGIOGRAPHY:   Fluoroscopy revealed significant calcification of the aortic valve.  There was significantly reduced excursion, but visible more pronounced motion of one leaflet.  Left main: Angiographically normal vessel which bifurcated into the LAD and left circumflex coronary artery.  LAD: Large normal vessel which gave rise to 2 diagonal vessels and several septal perforating arteries.  A portion of the LAD seemed to dip intramyocardially.  There was no evidence for stenosis or muscle bridging.  Left circumflex: Angiographically normal vessel which gave rise to 2 marginal branches.   Right coronary artery: Large  caliber vessel that had smooth 20% proximal focal narrowing.  The vessel supplied the PDA noted in the posterior lateral branch.  Left ventriculography  revealed moderately reduced global LV function with ejection fraction of 35-40%.  Supravalvular aortography revealed an upper normal aortic root, without poststenotic  dilatation.  There did not appear to be any significant aortic insufficiency.  Aortic valve excursion was reduced, but one leaflet moved more prominently.   IMPRESSION:  Critical aortic valvular stenosis.  No significant coronary obstructive disease with smooth 20% luminal narrowing of the proximal RCA and otherwise normal appearing coronary arteries.  Moderate global LV dysfunction with an ejection fraction of 35-40%.  Upper normal right heart pressures.  RECOMMENDATION:  Aortic valve replacement surgery.  Cardiothoracic surgery consultation was requested.   Troy Sine, MD, [facility] 06/22/2014 5:00 PM

## 2014-06-22 NOTE — H&P (View-Only) (Signed)
Patient Name: Erik [name] Date of Encounter: 06/22/2014    Principal Problem:   Chest pain Active Problems:   Aortic stenosis, severe   Elevated troponin   Pre-syncope   Primary Cardiologist: Dr Claiborne Billings (new)  Patient Profile: 79 yo male w/ no prev cardiac issues (hx SEM), no hx HTN, DM, HL, was admitted 04/05 w/ chest pain, severe AS by echo. Cath 04/07  SUBJECTIVE: No chest pain, no SOB  OBJECTIVE Filed Vitals:   06/21/14 0100 06/21/14 0500 06/21/14 1037 06/21/14 2151  BP: 91/52 117/70 130/94 97/56  Pulse: 64 83 72 63  Temp: 98.2 F (36.8 C) 98.2 F (36.8 C)  97.8 F (36.6 C)  TempSrc: Oral Oral  Oral  Resp: 18 18 18 16   Height:      Weight:  194 lb 9.6 oz (88.27 kg)    SpO2: 98% 99% 99% 99%    Intake/Output Summary (Last 24 hours) at 06/22/14 0646 Last data filed at 06/21/14 2300  Gross per 24 hour  Intake    720 ml  Output      0 ml  Net    720 ml   Filed Weights   06/19/14 1602 06/19/14 2256 06/21/14 0500  Weight: 195 lb (88.451 kg) 193 lb (87.544 kg) 194 lb 9.6 oz (88.27 kg)    PHYSICAL EXAM General: Well developed, well nourished, male in no acute distress. Head: Normocephalic, atraumatic.  Neck: Supple without bruits, JVD not elevated. Lungs:  Resp regular and unlabored, CTA. Heart: RRR, S1, S2 is decreased, no S3, S4, 3/6 murmur; no rub. Abdomen: Soft, non-tender, non-distended, BS + x 4.  Extremities: No clubbing, cyanosis, no edema.  Neuro: Alert and oriented X 3. Moves all extremities spontaneously. Psych: Normal affect.  LABS: CBC:  Recent Labs  06/21/14 0439 06/22/14 0532  WBC 4.9 5.1  NEUTROABS 3.2 3.3  HGB 13.4 13.1  HCT 36.9* 36.1*  MCV 105.4* 104.9*  PLT 177 171   INR:  Recent Labs  06/22/14 0539  INR 5.42   Basic Metabolic Panel:  Recent Labs  06/20/14 0239 06/21/14 0439  NA 137 138  K 4.2 3.9  CL 104 106  CO2 29 24  GLUCOSE 100* 113*  BUN 8 11  CREATININE 1.24 1.26  CALCIUM 8.7 8.9   Liver Function  Tests:  Recent Labs  06/20/14 0239 06/21/14 0439  AST 23 14  ALT 16 13  ALKPHOS 59 57  BILITOT 1.1 1.0  PROT 6.6 5.7*  ALBUMIN 3.6 3.2*   Cardiac Enzymes:  Recent Labs  06/19/14 2134 06/20/14 0239 06/20/14 1034  TROPONINI 0.06* 0.05* 0.05*   BNP:  B NATRIURETIC PEPTIDE  Date/Time Value Ref Range Status  06/19/2014 04:30 PM 1282.9* 0.0 - 100.0 pg/mL Final   D-dimer:  Recent Labs  06/19/14 1630  DDIMER 0.66*   Hemoglobin A1C:  Recent Labs  06/19/14 2103  HGBA1C 5.1   Fasting Lipid Panel:  Recent Labs  06/19/14 2134  CHOL 135  HDL 35*  LDLCALC 88  TRIG 60  CHOLHDL 3.9   Thyroid Function Tests:  Recent Labs  06/19/14 2103  TSH 2.924   TELE:   SR, S brady, bigeminal PACs, unusual P wave    Radiology/Studies: Dg Chest 2 View 06/19/2014   CLINICAL DATA:  Mid lower chest pain, onset today. Intermittent shortness of breath.  EXAM: CHEST  2 VIEW  COMPARISON:  Chest CT 06/04/2009  FINDINGS: Stable apparent cardiomegaly, accentuated by lower mediastinal fat and developmental  AP chest narrowing. Normal aortic and hilar contours. There is no edema, consolidation, effusion, or pneumothorax. Remote T11 superior endplate fracture.  IMPRESSION: No active cardiopulmonary disease.   Electronically Signed   By: Monte Fantasia M.D.   On: 06/19/2014 17:23   Ct Head Wo Contrast 06/19/2014   CLINICAL DATA:  Blurred vision and chest pain.  Syncope.  EXAM: CT HEAD WITHOUT CONTRAST  TECHNIQUE: Contiguous axial images were obtained from the base of the skull through the vertex without intravenous contrast.  COMPARISON:  01/06/2005  FINDINGS: Skull and Sinuses:Negative for fracture or destructive process. The mastoids, middle ears, and imaged paranasal sinuses are clear.  Orbits: Cataract resection bilaterally. No acute findings to explain blurred vision.  Brain: No evidence of acute infarction, hemorrhage, hydrocephalus, or intra-axial mass lesion/mass effect. Stable retro  cerebellar CSF accumulation with mild mass effect on the vermis. Cerebral volume loss which is expected for age.  IMPRESSION: Negative head CT.   Electronically Signed   By: Monte Fantasia M.D.   On: 06/19/2014 20:04   Ct Angio Chest Pe W/cm &/or Wo Cm 06/19/2014   CLINICAL DATA:  Chest pain.  EXAM: CT ANGIOGRAPHY CHEST WITH CONTRAST  TECHNIQUE: Multidetector CT imaging of the chest was performed using the standard protocol during bolus administration of intravenous contrast. Multiplanar CT image reconstructions and MIPs were obtained to evaluate the vascular anatomy.  CONTRAST:  142mL OMNIPAQUE IOHEXOL 350 MG/ML SOLN  COMPARISON:  CT scan of chest of June 04, 2009.  FINDINGS: No pneumothorax or pleural effusion is noted. No acute pulmonary disease is noted. There is no evidence of pulmonary embolus. No mediastinal mass or adenopathy is noted. Visualized portion of upper abdomen appears normal. No significant osseous abnormality is noted.  Review of the MIP images confirms the above findings.  IMPRESSION: No evidence of pulmonary embolus.   Electronically Signed   By: Marijo Conception, M.D.   On: 06/19/2014 20:12    Current Medications:  . aspirin EC  325 mg Oral Daily  . heparin  5,000 Units Subcutaneous 3 times per day  . sodium chloride  3 mL Intravenous Q12H  . sodium chloride  3 mL Intravenous Q12H   . sodium chloride 75 mL/hr (06/22/14 0539)    ASSESSMENT AND PLAN: Principal Problem:   Chest pain - minimal elevation in ez - for cath today, answered questions and addressed concerns  Active Problems:   Aortic stenosis, severe - per echo 04/06 - cath will help w/ plan    Elevated troponin - cath today, probably demand ischemia    Pre-syncope - may be 2nd AS - bradycardia w/ HR 50s might cause in conjunction w/ AS  Signed, Rosaria Ferries , PA-C 6:46 AM 06/22/2014

## 2014-06-22 NOTE — Progress Notes (Signed)
UR completed 

## 2014-06-23 ENCOUNTER — Encounter (HOSPITAL_COMMUNITY): Payer: Self-pay | Admitting: Thoracic Surgery (Cardiothoracic Vascular Surgery)

## 2014-06-23 ENCOUNTER — Observation Stay (HOSPITAL_COMMUNITY): Payer: Medicare Other

## 2014-06-23 ENCOUNTER — Other Ambulatory Visit: Payer: Self-pay

## 2014-06-23 DIAGNOSIS — I5021 Acute systolic (congestive) heart failure: Secondary | ICD-10-CM

## 2014-06-23 DIAGNOSIS — I429 Cardiomyopathy, unspecified: Secondary | ICD-10-CM | POA: Diagnosis not present

## 2014-06-23 DIAGNOSIS — R0789 Other chest pain: Secondary | ICD-10-CM | POA: Diagnosis not present

## 2014-06-23 DIAGNOSIS — I5032 Chronic diastolic (congestive) heart failure: Secondary | ICD-10-CM | POA: Diagnosis not present

## 2014-06-23 DIAGNOSIS — R55 Syncope and collapse: Secondary | ICD-10-CM

## 2014-06-23 DIAGNOSIS — I509 Heart failure, unspecified: Secondary | ICD-10-CM

## 2014-06-23 DIAGNOSIS — I350 Nonrheumatic aortic (valve) stenosis: Secondary | ICD-10-CM | POA: Diagnosis not present

## 2014-06-23 DIAGNOSIS — I25119 Atherosclerotic heart disease of native coronary artery with unspecified angina pectoris: Secondary | ICD-10-CM

## 2014-06-23 DIAGNOSIS — I2510 Atherosclerotic heart disease of native coronary artery without angina pectoris: Secondary | ICD-10-CM | POA: Diagnosis not present

## 2014-06-23 LAB — PULMONARY FUNCTION TEST
DL/VA % pred: 80 %
DL/VA: 3.81 ml/min/mmHg/L
DLCO cor % pred: 52 %
DLCO cor: 18.98 ml/min/mmHg
DLCO unc % pred: 51 %
DLCO unc: 18.6 ml/min/mmHg
FEF 25-75 Post: 2.77 L/sec
FEF 25-75 Pre: 2 L/sec
FEF2575-%Change-Post: 37 %
FEF2575-%PRED-PRE: 82 %
FEF2575-%Pred-Post: 113 %
FEV1-%Change-Post: 6 %
FEV1-%Pred-Post: 78 %
FEV1-%Pred-Pre: 73 %
FEV1-POST: 2.67 L
FEV1-Pre: 2.5 L
FEV1FVC-%CHANGE-POST: 2 %
FEV1FVC-%Pred-Pre: 105 %
FEV6-%CHANGE-POST: 4 %
FEV6-%PRED-PRE: 73 %
FEV6-%Pred-Post: 76 %
FEV6-POST: 3.37 L
FEV6-PRE: 3.22 L
FEV6FVC-%CHANGE-POST: 0 %
FEV6FVC-%PRED-POST: 105 %
FEV6FVC-%Pred-Pre: 104 %
FVC-%Change-Post: 3 %
FVC-%PRED-POST: 72 %
FVC-%PRED-PRE: 69 %
FVC-PRE: 3.28 L
FVC-Post: 3.4 L
POST FEV6/FVC RATIO: 99 %
Post FEV1/FVC ratio: 78 %
Pre FEV1/FVC ratio: 76 %
Pre FEV6/FVC Ratio: 99 %
RV % pred: 79 %
RV: 2.2 L
TLC % PRED: 67 %
TLC: 5.2 L

## 2014-06-23 LAB — CBC WITH DIFFERENTIAL/PLATELET
BASOS ABS: 0.1 10*3/uL (ref 0.0–0.1)
Basophils Relative: 1 % (ref 0–1)
EOS PCT: 3 % (ref 0–5)
Eosinophils Absolute: 0.2 10*3/uL (ref 0.0–0.7)
HEMATOCRIT: 38 % — AB (ref 39.0–52.0)
HEMOGLOBIN: 13.9 g/dL (ref 13.0–17.0)
Lymphocytes Relative: 20 % (ref 12–46)
Lymphs Abs: 1.2 10*3/uL (ref 0.7–4.0)
MCH: 38.4 pg — AB (ref 26.0–34.0)
MCHC: 36.6 g/dL — AB (ref 30.0–36.0)
MCV: 105 fL — ABNORMAL HIGH (ref 78.0–100.0)
MONO ABS: 0.6 10*3/uL (ref 0.1–1.0)
MONOS PCT: 10 % (ref 3–12)
Neutro Abs: 3.9 10*3/uL (ref 1.7–7.7)
Neutrophils Relative %: 67 % (ref 43–77)
Platelets: 169 10*3/uL (ref 150–400)
RBC: 3.62 MIL/uL — ABNORMAL LOW (ref 4.22–5.81)
RDW: 12.9 % (ref 11.5–15.5)
WBC: 5.8 10*3/uL (ref 4.0–10.5)

## 2014-06-23 LAB — COMPREHENSIVE METABOLIC PANEL
ALBUMIN: 3.2 g/dL — AB (ref 3.5–5.2)
ALK PHOS: 57 U/L (ref 39–117)
ALT: 14 U/L (ref 0–53)
AST: 15 U/L (ref 0–37)
Anion gap: 8 (ref 5–15)
BILIRUBIN TOTAL: 1.7 mg/dL — AB (ref 0.3–1.2)
BUN: 9 mg/dL (ref 6–23)
CALCIUM: 8.8 mg/dL (ref 8.4–10.5)
CO2: 24 mmol/L (ref 19–32)
CREATININE: 1.26 mg/dL (ref 0.50–1.35)
Chloride: 105 mmol/L (ref 96–112)
GFR calc Af Amer: 62 mL/min — ABNORMAL LOW (ref >90)
GFR, EST NON AFRICAN AMERICAN: 54 mL/min — AB (ref >90)
Glucose, Bld: 85 mg/dL (ref 70–99)
Potassium: 4.3 mmol/L (ref 3.5–5.1)
Sodium: 137 mmol/L (ref 135–145)
Total Protein: 6.3 g/dL (ref 6.0–8.3)

## 2014-06-23 MED ORDER — ALBUTEROL SULFATE (2.5 MG/3ML) 0.083% IN NEBU
2.5000 mg | INHALATION_SOLUTION | Freq: Once | RESPIRATORY_TRACT | Status: AC
  Administered 2014-06-23: 2.5 mg via RESPIRATORY_TRACT

## 2014-06-23 NOTE — Progress Notes (Signed)
TCTS BRIEF PROGRESS NOTE   Patient seen and examined, chart, ECHO, cath and chest CT reviewed.  Full consult note to follow.  [name] needs aortic valve replacement.  We tentatively plan to proceed with surgery next Friday April 15.  He desires to go home during the interim period of time.  I think this is reasonable as long as he takes it easy and avoids any type of strenuous activity.  He should not be driving an automobile.  I will make all necessary arrangements for return to Short Stay for pre op on Thursday April 14.  Please call if any further problems or questions arise.  Rexene Alberts 06/23/2014 10:10 AM

## 2014-06-23 NOTE — Progress Notes (Signed)
UR completed 

## 2014-06-23 NOTE — Discharge Instructions (Signed)
Aortic Stenosis  Aortic stenosis is a narrowing of the aortic valve. The aortic valve is a gatelike structure that is located between the lower left chamber of the heart (left ventricle) and the blood vessel that leads away from the heart (aorta). When the aortic valve is narrowed, it does not open all the way. This makes it hard for the heart to pump blood into the aorta and causes the heart to work harder. The extra work can weaken the heart over time and lead to heart failure.  CAUSES   Causes of aortic stenosis include:  · Calcium deposits on the aortic valve that have made the valve stiff. This condition generally affects those over the age of 65. It is the most common cause of aortic stenosis.  · A birth defect.   · Rheumatic fever. This is a problem that may occur after a strep throat infection that was not treated adequately. Rheumatic fever can cause permanent damage to heart valves.  SIGNS AND SYMPTOMS   People with aortic stenosis usually have no symptoms until the condition becomes severe. It may take 10-20 years for mild or moderate aortic stenosis to become severe. Symptoms may include:   · Shortness of breath, especially with physical activity.    · Feeling weak and tired (fatigued) or getting tired easily.  · Chest discomfort (angina). This may occur with minimal activity if the aortic stenosis is severe.   · An irregular or faster-than-normal heartbeat.  · Dizziness or fainting that happens with exertion or after taking certain heart medicines (such as nitroglycerin).  DIAGNOSIS   Aortic stenosis is usually diagnosed with a physical exam and with a type of imaging test called echocardiography. During echocardiography, sound waves are used to evaluate how blood flows through the heart. If your health care provider suspects aortic stenosis but the test does not clearly show it, a procedure called cardiac catheterization may be done to diagnose the condition. Tests may also be done to evaluate heart  function. They may include:  · Electrocardiography. During this test, the electrical impulses of the heart are recorded while you are lying down and sticky patches are placed on your chest, arms, and legs.  · Stress tests. There is more than one type of stress test. If a stress test is needed, ask your health care provider about which type is best for you.  · Blood tests.  TREATMENT   Treatment depends on how severe the aortic stenosis is, your symptoms, and the problems it is causing.   · Observation. If the aortic stenosis is mild, no treatment may be needed. However, you will need to have the condition checked regularly to make sure it is not getting worse or causing serious problems.  · Surgery. Surgery to repair or replace the aortic valve is the most common treatment for aortic stenosis. Several types of surgeries are available. The most common are open-heart surgery and transcutaneous aortic valve replacement (TAVR). TAVR does not require that the chest be opened. It is usually performed on elderly patients and those who are not able to have open-heart surgery.  · Medicines. Medicines may be given to keep symptoms from getting worse. Medicines cannot reverse aortic stenosis.  HOME CARE INSTRUCTIONS   · You may need to avoid certain types of physical activity. If your aortic stenosis is mild, you may need to avoid only strenuous activity. The more severe your aortic stenosis, the more activities you will need to avoid. Talk with your health care provider about   the types of activity you should avoid.  · Take medicines only as directed by your health care provider.  · If you are a woman with aortic valve stenosis and want to get pregnant, talk to your health care provider before you become pregnant.  · If you are a woman with aortic valve stenosis and are pregnant, keep all follow-up visits with all recommended health care providers.  · Keep all follow-up visits for tests, exams, and treatments as directed by  your health care provider.  SEEK IMMEDIATE MEDICAL CARE IF:  · You develop chest pain or tightness.    · You develop shortness of breath or difficulty breathing.    · You develop light-headedness or faint.    · It feels like your heartbeat is irregular or faster than normal.  · You have a fever.  Document Released: 11/30/2002 Document Revised: 07/18/2013 Document Reviewed: 02/26/2012  ExitCare® Patient Information ©2015 [employer]. This information is not intended to replace advice given to you by your health care provider. Make sure you discuss any questions you have with your health care provider.

## 2014-06-23 NOTE — Progress Notes (Signed)
Pt has orders to be discharged. Discharge instructions given and pt has no additional questions at this time. IV removed and site in good condition. Pt stable and waiting to be wheeled out.  Maurene Capes RN

## 2014-06-23 NOTE — Progress Notes (Signed)
CARDIAC REHAB PHASE I   PRE:  Rate/Rhythm: 70 SR c/PACs and PVCs  BP:  Sitting: 124/82        SaO2: 98 RA  MODE:  Ambulation: 540 ft   POST:  Rate/Rhythm: 89 SR  BP:  Sitting: 122/78         SaO2: 98 RA  Pt ambulated independently 540 feet, tolerated well.  Pre-op OHS education completed, pt verbalized understanding. Reviewed IS use, mobility, sternal precautions. Pt given IS, OHS book, and guidelines. Pt left with instructions for pre and post-op videos.   [id number]  Lenna Sciara, RN, BSN 06/23/2014 3:16 PM

## 2014-06-23 NOTE — Progress Notes (Signed)
Subjective: No complaints, no lightheadedness  Objective: Vital signs in last 24 hours: Temp:  [97.7 F (36.5 C)-98.6 F (37 C)] 98.1 F (36.7 C) (04/08 1028) Pulse Rate:  [33-83] 69 (04/08 1028) Resp:  [13-20] 18 (04/08 1028) BP: (94-138)/(52-98) 116/87 mmHg (04/08 1028) SpO2:  [91 %-100 %] 98 % (04/08 1028) Weight:  [183 lb 6.4 oz (83.19 kg)] 183 lb 6.4 oz (83.19 kg) (04/08 0517) Weight change:  Last BM Date: 06/22/14 Intake/Output from previous day: -565 04/07 0701 - 04/08 0700 In: 360 [P.O.:360] Out: 925 [Urine:925] Intake/Output this shift: Total I/O In: 240 [P.O.:240] Out: -   PE: General:Pleasant affect, NAD Skin:Warm and dry, brisk capillary refill HEENT:normocephalic, sclera clear, mucus membranes moist Heart:S1S2 RRR with 3/6 systolic murmur, no gallup, rub or click Lungs:clear without rales, rhonchi, or wheezes GGY:IRSW, non tender, + BS, do not palpate liver spleen or masses Ext:no lower ext edema, 2+ pedal pulses, 2+ radial pulses Neuro:alert and oriented X 3, MAE, follows commands, + facial symmetry Tele:  SR with freq PACs and rare NSVT    Lab Results:  Recent Labs  06/22/14 0532 06/23/14 0256  WBC 5.1 5.8  HGB 13.1 13.9  HCT 36.1* 38.0*  PLT 171 169   BMET  Recent Labs  06/22/14 0532 06/23/14 0256  NA 138 137  K 4.0 4.3  CL 106 105  CO2 24 24  GLUCOSE 100* 85  BUN 7 9  CREATININE 1.22 1.26  CALCIUM 8.7 8.8   No results for input(s): TROPONINI in the last 72 hours.  Invalid input(s): CK, MB  Lab Results  Component Value Date   CHOL 135 06/19/2014   HDL 35* 06/19/2014   LDLCALC 88 06/19/2014   TRIG 60 06/19/2014   CHOLHDL 3.9 06/19/2014   Lab Results  Component Value Date   HGBA1C 5.1 06/19/2014     Lab Results  Component Value Date   TSH 2.924 06/19/2014    Hepatic Function Panel  Recent Labs  06/23/14 0256  PROT 6.3  ALBUMIN 3.2*  AST 15  ALT 14  ALKPHOS 57  BILITOT 1.7*   No results for  input(s): CHOL in the last 72 hours. No results for input(s): PROTIME in the last 72 hours.     Studies/Results: No results found.  Medications: I have reviewed the patient's current medications. Scheduled Meds: . aspirin EC  81 mg Oral Daily  . heparin  5,000 Units Subcutaneous 3 times per day  . sodium chloride  3 mL Intravenous Q12H   Continuous Infusions: . sodium chloride 75 mL/hr (06/22/14 0539)   PRN Meds:.acetaminophen, nitroGLYCERIN, ondansetron (ZOFRAN) IV  Assessment/Plan: Principal Problem:  Chest pain - minimal elevation in ez -  cath with non obstructive CAD, smooth 20% luminal narrowing of the proximal RCA  Active Problems:  Aortic stenosis, severe/critical - per echo 04/06 - plan for AVR plan next Friday the 15th with Dr. Roxy Manns.  Pt may go home, no strenuous activity and no driving.   Elevated troponin -  probably demand ischemia with severe AS  Cardiomyopathy non ischemic, may be due to valve  EF 35-40%    Pre-syncope - may be 2nd AS - bradycardia w/ HR 50s might cause in conjunction w/ AS  Sinus brady to 49-50, some due to freq PACs. Some rapid beats.     follow up after surgery with Dr. Corky Downs .  Time spent with pt. :15 minutes. [facility]  Nurse Practitioner  Certified Pager [phone] or after 5pm and on weekends call [phone] 06/23/2014, 1:07 PM

## 2014-06-23 NOTE — Consult Note (Signed)
[address]       [place] [address]             [phone]          CARDIOTHORACIC SURGERY CONSULTATION REPORT  PCP is Jani Gravel, MD Referring Provider is Troy Sine, MD  Reason for consultation:  Severe aortic stenosis  HPI:  Patient is a 79 year old male who has been remarkably healthy for all of his life although many years ago he was told that he had a heart murmur on physical exam. He denies any previous knowledge of the diagnosis of aortic stenosis, although he thinks he may have had an echocardiogram done in the remote past. He has remained physically active and completely functionally independent without any significant limitations until recently. He states that over the last 6 months or so he has developed some mild exertional shortness of breath and fatigue. This would only develop with relatively strenuous exertion. He otherwise felt well until 06/19/2014 when he developed sudden onset of sharp substernal chest tightness and dizziness. At the time he had been working in his yard doing some fairly strenuous activity. He had to sit down for fear of passing out. His symptoms resolved within 5-10 minutes. He presented to the emergency department where troponin levels were minimally positive at 0.05 and BNP level was elevated to 1280. Head CT scan was normal. CT angiogram of the chest was negative for pulmonary embolus.  Cardiology consultation was requested and the patient was evaluated by Dr. Georgina Peer. He was found to have a harsh, late peaking systolic murmur suspicious for severe aortic stenosis. Transthoracic echocardiogram confirmed the presence of critical aortic stenosis with normal left ventricular systolic function. Peak velocity across the aortic valve measured in excess of 5.4 m/s corresponding to peak and mean transvalvular gradient estimated 119 and 58 mmHg, respectively. Left ventricular ejection fraction was estimated at 50-55%.  Left and right  heart catheterization was performed by Dr. Claiborne Billings on 06/22/2014. This confirmed the presence of severe aortic stenosis with peak to peak and mean transvalvular gradients measured 66 and 44 mmHg, respectively. Aortic valve area was calculated 0.58 cm. There was normal coronary artery anatomy with no significant coronary artery disease.  Pulmonary artery pressures were normal. Cardiothoracic surgical consultation was requested.  The patient is married and lives locally in [place] with his wife.  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 He has remained physically active and busy during retirement. He works part-time for his [employer]. He reports no physical limitations whatsoever until recently. He has recently developed new onset symptoms of exertional shortness of breath that are only associated with relatively strenuous activity.  Past Medical History  Diagnosis Date  . Heart murmur   . Chest pain 06/19/2014  . Aortic stenosis, severe   . Chronic diastolic congestive heart failure     Past Surgical History  Procedure Laterality Date  . Appendectomy    . Hand surgery    . Cataract extraction Bilateral ? 2013    & 2014  . Left and right heart catheterization with coronary angiogram N/A 06/22/2014    Procedure: LEFT AND RIGHT HEART CATHETERIZATION WITH CORONARY ANGIOGRAM;  Surgeon: Troy Sine, MD;  Location: [facility] CATH LAB;  Service: Cardiovascular;  Laterality: N/A;    History reviewed. No pertinent family history.  History   Social History  . Marital Status: Married    Spouse Name: N/A  .  Number of Children: N/A  . Years of Education: N/A   Occupational History  . Not on file.   Social History Main Topics  . Smoking status: Former Research scientist (life sciences)  . Smokeless tobacco: Never Used  . Alcohol Use: Yes     Comment: occ   . Drug Use: No  . Sexual Activity: Not on file   Other Topics Concern  . Not on file   Social  History Narrative    Prior to Admission medications   Medication Sig Start Date End Date Taking? Authorizing Provider  aspirin 325 MG tablet Take 325 mg by mouth daily.   Yes Historical Provider, MD    Current Facility-Administered Medications  Medication Dose Route Frequency Provider Last Rate Last Dose  . 0.9 %  sodium chloride infusion   Intravenous Continuous Rogelia Mire, NP 75 mL/hr at 06/22/14 0539 75 mL/hr at 06/22/14 0539  . acetaminophen (TYLENOL) tablet 650 mg  650 mg Oral Q4H PRN Troy Sine, MD      . aspirin EC tablet 81 mg  81 mg Oral Daily Troy Sine, MD   81 mg at 06/23/14 1610  . heparin injection 5,000 Units  5,000 Units Subcutaneous 3 times per day Deneise Lever, MD   5,000 Units at 06/19/14 2235  . nitroGLYCERIN (NITROSTAT) SL tablet 0.4 mg  0.4 mg Sublingual Q5 min PRN Deneise Lever, MD      . ondansetron [facility]) injection 4 mg  4 mg Intravenous Q6H PRN Troy Sine, MD      . sodium chloride 0.9 % injection 3 mL  3 mL Intravenous Q12H Deneise Lever, MD   3 mL at 06/23/14 9604    No Known Allergies    Review of Systems:   General:  normal appetite, normal energy, no weight gain, no weight loss, no fever  Cardiac:  + chest pain with exertion, no chest pain at rest, + SOB with exertion, no resting SOB, no PND, no orthopnea, no palpitations, no arrhythmia, no atrial fibrillation, no LE edema, + dizzy spells, 1 near syncope episode  Respiratory:  no shortness of breath, no home oxygen, no productive cough, no dry cough, no bronchitis, no wheezing, no hemoptysis, no asthma, no pain with inspiration or cough, no sleep apnea, no CPAP at night  GI:   no difficulty swallowing, no reflux, no frequent heartburn, no hiatal hernia, no abdominal pain, no constipation, no diarrhea, no hematochezia, no hematemesis, no melena  GU:   no dysuria,  no frequency, no urinary tract infection, no hematuria, no enlarged prostate, no kidney stones, no kidney  disease  Vascular:  no pain suggestive of claudication, no pain in feet, no leg cramps, no varicose veins, no DVT, no non-healing foot ulcer  Neuro:   no stroke, no TIA's, no seizures, no headaches, no temporary blindness one eye,  no slurred speech, no peripheral neuropathy, no chronic pain, no instability of gait, no memory/cognitive dysfunction  Musculoskeletal: no arthritis, no joint swelling, no myalgias, no difficulty walking, normal mobility   Skin:   no rash, no itching, no skin infections, no pressure sores or ulcerations  Psych:   no anxiety, no depression, no nervousness, no unusual recent stress  Eyes:   no blurry vision, no floaters, no recent vision changes, does not wear glasses or contacts  ENT:   no hearing loss, no loose or painful teeth, no dentures, last saw dentist within the past year  Hematologic:  no easy bruising,  no abnormal bleeding, no clotting disorder, no frequent epistaxis  Endocrine:  no diabetes, does not check CBG's at home     Physical Exam:   BP 116/87 mmHg  Pulse 69  Temp(Src) 98.1 F (36.7 C) (Oral)  Resp 18  Ht 6\' 1"  (1.854 m)  Wt 83.19 kg (183 lb 6.4 oz)  BMI 24.20 kg/m2  SpO2 98%  General:    well-appearing  HEENT:  Unremarkable   Neck:   no JVD, no bruits, no adenopathy   Chest:   clear to auscultation, symmetrical breath sounds, no wheezes, no rhonchi   CV:   RRR, harsh, late-peaking systolic murmur best RUSB  Abdomen:  soft, non-tender, no masses   Extremities:  warm, well-perfused, pulses palpable, no lower extremity edema  Rectal/GU  Deferred  Neuro:   Grossly non-focal and symmetrical throughout  Skin:   Clean and dry, no rashes, no breakdown  Diagnostic Tests:  Transthoracic Echocardiography  Patient:  Dyrell, Tuccillo MR #:    [id number] Study Date: 06/21/2014 Gender:   M Age:    37 Height:   185.4 cm Weight:   86.2 kg BSA:    2.11 m^2 Pt. Status: Room:    [facility]  Deneise Lever ATTENDING  Dhungel, Nishant [id number] PERFORMING  [facility], Inpatient SONOGRAPHER Darlina Sicilian, RDCS  cc:  ------------------------------------------------------------------- LV EF: 50% -  55%  ------------------------------------------------------------------- Indications:   Chest pain 786.51.  ------------------------------------------------------------------- History:  PMH: Pre-Syncope. Elevated Troponin. Murmur.  ------------------------------------------------------------------- Study Conclusions  - Left ventricle: The cavity size was normal. Wall thickness was increased in a pattern of moderate LVH. Systolic function was normal. The estimated ejection fraction was in the range of 50% to 55%. Wall motion was normal; there were no regional wall motion abnormalities. Features are consistent with a pseudonormal left ventricular filling pattern, with concomitant abnormal relaxation and increased filling pressure (grade 2 diastolic dysfunction). - Aortic valve: There was critical stenosis. Valve area (VTI): 0.44 cm^2. Valve area (Vmax): 0.4 cm^2. Valve area (Vmean): 0.42 cm^2. - Mitral valve: Calcified annulus. Mildly thickened leaflets . - Right atrium: The atrium was mildly dilated.  Impressions:  - Critical aortic stenosis.  Transthoracic echocardiography. M-mode, complete 2D, spectral Doppler, and color Doppler. Birthdate: Patient birthdate: 05/21/1935. Age: Patient is 79 yr old. Sex: Gender: male. BMI: 25.1 kg/m^2. Blood pressure:   130/94 Patient status: Inpatient. Study date: Study date: 06/21/2014. Study time: 09:24 AM. Location: Echo laboratory.  -------------------------------------------------------------------  ------------------------------------------------------------------- Left ventricle: The cavity size was normal. Wall thickness was increased in a pattern of moderate LVH.  Systolic function was normal. The estimated ejection fraction was in the range of 50% to 55%. Wall motion was normal; there were no regional wall motion abnormalities. Features are consistent with a pseudonormal left ventricular filling pattern, with concomitant abnormal relaxation and increased filling pressure (grade 2 diastolic dysfunction).  ------------------------------------------------------------------- Aortic valve:  Severely thickened, severely calcified leaflets. Doppler:  There was critical stenosis.   VTI ratio of LVOT to aortic valve: 0.14. Valve area (VTI): 0.44 cm^2. Indexed valve area (VTI): 0.21 cm^2/m^2. Valve area (Vmax): 0.4 cm^2. Indexed valve area (Vmax): 0.19 cm^2/m^2. Mean velocity ratio of LVOT to aortic valve: 0.13. Valve area (Vmean): 0.42 cm^2. Indexed valve area (Vmean): 0.2 cm^2/m^2.  Mean gradient (S): 58 mm Hg. Peak gradient (S): 119 mm Hg.  ------------------------------------------------------------------- Aorta: The aorta was normal, not dilated, and non-diseased.  ------------------------------------------------------------------- Mitral valve:  Calcified annulus. Mildly thickened leaflets .  Doppler: There was trivial regurgitation.  Peak gradient (D): 4 mm Hg.  ------------------------------------------------------------------- Left atrium: The atrium was normal in size.  ------------------------------------------------------------------- Right ventricle: The cavity size was normal. Wall thickness was normal. Systolic function was normal.  ------------------------------------------------------------------- Pulmonic valve:  Poorly visualized. Doppler: There was no significant regurgitation.  ------------------------------------------------------------------- Tricuspid valve:  Structurally normal valve.  Leaflet separation was normal. Doppler: Transvalvular velocity was within the normal range. There was no  regurgitation.  ------------------------------------------------------------------- Right atrium: The atrium was mildly dilated.  ------------------------------------------------------------------- Pericardium: There was no pericardial effusion.  ------------------------------------------------------------------- Systemic veins: Inferior vena cava: The vessel was normal in size. The respirophasic diameter changes were in the normal range (>= 50%), consistent with normal central venous pressure.  ------------------------------------------------------------------- Post procedure conclusions Ascending Aorta:  - The aorta was normal, not dilated, and non-diseased.  ------------------------------------------------------------------- Measurements  Left ventricle              Value     Reference LV ID, ED, PLAX chordal          47.1 mm    43 - 52 LV ID, ES, PLAX chordal          34.1 mm    23 - 38 LV fx shortening, PLAX chordal  (L)   28  %    >=29 LV PW thickness, ED            11  mm    --------- IVS/LV PW ratio, ED            1.26      <=1.3 Stroke volume, 2D             54  ml    --------- Stroke volume/bsa, 2D           26  ml/m^2  --------- LV e&', lateral              6.57 cm/s   --------- LV E/e&', lateral             15.98     --------- LV e&', medial               4.38 cm/s   --------- LV E/e&', medial              23.97     --------- LV e&', average              5.48 cm/s   --------- LV E/e&', average             19.18     ---------  Ventricular septum            Value     Reference IVS thickness, ED             13.9 mm    ---------  LVOT                   Value      Reference LVOT ID, S                20  mm    --------- LVOT area                 3.14 cm^2   --------- LVOT peak velocity, S           68.7 cm/s   --------- LVOT mean velocity, S           45.4 cm/s   --------- LVOT VTI, S  17.1 cm    --------- LVOT peak gradient, S           2   mm Hg  ---------  Aortic valve               Value     Reference Aortic valve mean velocity, S       337  cm/s   --------- Aortic valve VTI, S            122  cm    --------- Aortic mean gradient, S          58  mm Hg  --------- Aortic peak gradient, S          119  mm Hg  --------- VTI ratio, LVOT/AV            0.14      --------- Aortic valve area, VTI          0.44 cm^2   --------- Aortic valve area/bsa, VTI        0.21 cm^2/m^2 --------- Aortic valve area, peak velocity     0.4  cm^2   --------- Aortic valve area/bsa, peak        0.19 cm^2/m^2 --------- velocity Velocity ratio, mean, LVOT/AV       0.13      --------- Aortic valve area, mean velocity     0.42 cm^2   --------- Aortic valve area/bsa, mean        0.2  cm^2/m^2 --------- velocity  Aorta                   Value     Reference Aortic root ID, ED            30  mm    ---------  Left atrium                Value     Reference LA ID, A-P, ES              36  mm    --------- LA ID/bsa, A-P              1.7  cm/m^2  <=2.2 LA volume, ES, 1-p A4C          79  ml    --------- LA volume/bsa, ES, 1-p A4C        37.4 ml/m^2  --------- LA volume, ES, 1-p A2C          39.2 ml    --------- LA volume/bsa, ES, 1-p A2C        18.5  ml/m^2  ---------  Mitral valve               Value     Reference Mitral E-wave peak velocity        105  cm/s   --------- Mitral A-wave peak velocity        34.4 cm/s   --------- Mitral deceleration time         180  ms    150 - 230 Mitral peak gradient, D          4   mm Hg  --------- Mitral E/A ratio, peak          3.1      ---------  Systemic veins              Value     Reference Estimated CVP  3   mm Hg  ---------  Right ventricle              Value     Reference RV s&', lateral, S             12.3 cm/s   ---------  Legend: (L) and (H) mark values outside specified reference range.  ------------------------------------------------------------------- Prepared and Electronically Authenticated by  Darlin Coco 2016-04-06T13:11:14   RIGHT AND LEFT HEART CARDIAC CATHETERIZATION   HISTORY:  Kamaree Wheatley is a 80 y.o. male recently experiencing a brief episode of chest discomfort associated with presyncope with transient visual blurring. Physical examination suggested a murmur of severe aortic stenosis. An echo Doppler study revealed critical aortic stenosis. He is now referred for right and left heart catheterization.   PROCEDURE: Right and left heart catheterization: Swan-Ganz catheterization, cardiac Determination by the thermodilution and Fick methods, coronary angiography, left ventriculography, supravalvular aortography.  The patient was brought to the second floor [facility] cath lab in the postabsorptive state. Versed 2 mg and fentanyl 50 mcg were administered for conscious sedation. The right groin was prepped and draped in sterile fashion and a 5 [place] arterial sheath and 7 French venous sheath were inserted without difficulty. A Swan-Ganz catheter was advanced into the venous  sheath and pressures were obtained in the right atrium, right ventricle, pulmonary artery, and pulmonary capillary wedge position. Cardiac outputs were obtained by the thermodilution and assumed Fick methods. Oxygen saturation was obtained in the pulmonary artery and aorta. A pigtail catheter was inserted and simultaneous AO/PA pressures were recorded. Supravalvular aortography was performed. The pigtail catheter was then removed and diagnostic catheterization to delineate the coronary anatomy was performed utilizing 5 French Judkins 4 left and right diagnostic catheters. With the RCA catheter and with mobility to one leaflet clearly visible. One brief attempt was made to cross the valve and this was able to be done fairly promptly without difficulty utilizing a straight wire. The RCA catheter was then exchanged for a pigtail catheter. Simultaneous FA and LV pressures and left ventricular and PCW pressures were recorded. Left ventriculography was performed using 25 cc of contrast at a flow rate of 12 cc/s. A careful LV to AO pullback was performed. Simultaneous FA and AO pressures were recorded, which revealed a 10 mm difference between the higher FA pressure in the central aorta. All catheters were removed and the patient. Hemostasis was obtained by direct manual pressure. The patient tolerated the procedure well and returned to his room in satisfactory condition.   HEMODYNAMICS:   RA: a 4 Mean 3 RV: 30/6 PA: 29/12 PC: 18, V 19; mean 15  AO: 105/56 PA: 33/12   LV: 178/29 AO: 109/58    Oxygen saturation in the aorta 92% and the pulmonary artery 63%  Cardiac output: 4 l/min (Thermo); 5.5 (Fick)  Cardiac index: 1.9 l/m/m2 2.6  AV: peak to peak 66-69 mm HG; Mean 44.2 AVA 0.58 cm2  ANGIOGRAPHY:   Fluoroscopy revealed significant calcification of the aortic valve. There was significantly reduced excursion, but visible more pronounced motion of one  leaflet.  Left main: Angiographically normal vessel which bifurcated into the LAD and left circumflex coronary artery.  LAD: Large normal vessel which gave rise to 2 diagonal vessels and several septal perforating arteries. A portion of the LAD seemed to dip intramyocardially. There was no evidence for stenosis or muscle bridging.  Left circumflex: Angiographically normal vessel which gave rise to 2 marginal branches.   Right coronary  artery: Large caliber vessel that had smooth 20% proximal focal narrowing. The vessel supplied the PDA noted in the posterior lateral branch.  Left ventriculography revealed moderately reduced global LV function with ejection fraction of 35-40%.  Supravalvular aortography revealed an upper normal aortic root, without poststenotic dilatation. There did not appear to be any significant aortic insufficiency. Aortic valve excursion was reduced, but one leaflet moved more prominently.   IMPRESSION:  Critical aortic valvular stenosis.  No significant coronary obstructive disease with smooth 20% luminal narrowing of the proximal RCA and otherwise normal appearing coronary arteries.  Moderate global LV dysfunction with an ejection fraction of 35-40%.  Upper normal right heart pressures.  RECOMMENDATION:  Aortic valve replacement surgery. Cardiothoracic surgery consultation was requested.   Troy Sine, MD, [facility] 06/22/2014 5:00 PM            CT ANGIOGRAPHY CHEST WITH CONTRAST  TECHNIQUE: Multidetector CT imaging of the chest was performed using the standard protocol during bolus administration of intravenous contrast. Multiplanar CT image reconstructions and MIPs were obtained to evaluate the vascular anatomy.  CONTRAST: 16mL OMNIPAQUE IOHEXOL 350 MG/ML SOLN  COMPARISON: CT scan of chest of June 04, 2009.  FINDINGS: No pneumothorax or pleural effusion is noted. No acute pulmonary disease is noted. There is no evidence of  pulmonary embolus. No mediastinal mass or adenopathy is noted. Visualized portion of upper abdomen appears normal. No significant osseous abnormality is noted.  Review of the MIP images confirms the above findings.  IMPRESSION: No evidence of pulmonary embolus.   Electronically Signed  By: Marijo Conception, M.D.  On: 06/19/2014 20:12    Impression:  Patient has stage D severe symptomatic aortic stenosis. He presents with recent onset symptoms of exertional shortness of breath that occur only with relatively strenuous activity.  He suffered a near syncopal episode several days ago that occured while the patient had been working strenuously in his yard.  I have personally reviewed the patient's transthoracic echocardiogram, diagnostic cardiac catheterization, and CT angiogram of the chest. The patient has critical aortic stenosis with severe calcification, thickening, and leaflet restriction involving all 3 leaflets of the aortic valve. The aortic valve may be functionally bicuspid. There is severe aortic stenosis with peak velocity across the aortic valve measured well above 5 m/s. Left ventricular systolic function appears mildly reduced. The patient does not have significant artery artery disease and right sided pressures were normal. Risks associated with conventional surgical aortic valve replacement should be relatively low. However, CT angiogram of the chest and aortic root injection during catheterization both demonstrate a fairly significant amount of calcification involving the lateral wall of the ascending thoracic aorta.  This calcification is not circumferential and does not appear to be necessarily prohibitive to conventional surgery, but it certainly could increase the risks of stroke, bleeding, or aortic dissection.    Plan:  The patient and his wife were counseled at length regarding treatment alternatives for management of severe symptomatic aortic stenosis. Alternative  approaches such as conventional aortic valve replacement, transcatheter aortic valve replacement, and palliative medical therapy were compared and contrasted at length.  The natural history of severe symptomatic aortic stenosis was discussed, as was the poor prognosis associated with medical therapy.  The risks associated with conventional surgical aortic valve replacement were been discussed in detail, as were expectations for post-operative convalescence.  The implications regarding the presence of calcification in the ascending aorta was discussed. The relative risks and benefits of transcatheter aortic valve replacement as  an alternative were discussed.  This discussion was placed in the context of the patient's own specific clinical presentation, past medical history and life expectancy.  Discussion was held comparing the relative risks of mechanical valve replacement with need for lifelong anticoagulation versus use of a bioprosthetic tissue valve and the associated potential for late structural valve deterioration and failure.  This discussion was placed in the context of the patient's particular circumstances, and as a result the patient specifically requests that their valve be replaced using a bioprosthetic tissue valve using conventional open surgical techniques.  All of their questions been addressed.   The patient understands and accepts all potential associated risks of surgery including but not limited to risk of death, stroke, myocardial infarction, congestive heart failure, respiratory failure, renal failure, pneumonia, bleeding requiring blood transfusion and or reexploration, arrhythmia, heart block or bradycardia requiring permanent pacemaker, aortic dissection or other major vascular complication, pleural effusions or other delayed complications related to continued congestive heart failure, and other late complications related to valve replacement including structural valve deterioration and  failure, thrombosis, endocarditis, or paravalvular leak.  We tentatively plan to proceed with surgery on Friday, 06/30/2014. I think it is reasonable to let the patient go home during the interim period of time. I have instructed the patient to avoid any kind of strenuous activity between now and the time of surgery. He should not drive an automobile.   I spent in excess of 120 minutes during the conduct of this hospital consultation and >50% of this time involved direct face-to-face encounter for counseling and/or coordination of the patient's care.    Valentina Gu. Roxy Manns, MD 06/23/2014 11:10 AM

## 2014-06-23 NOTE — Discharge Summary (Signed)
Physician Discharge Summary  Erik [name] FBP:[id number] DOB: 02/26/1936 DOA: 06/19/2014  PCP: Jani Gravel, MD  Admit date: 06/19/2014 Discharge date: 06/23/2014  Time spent: 30 minutes  Recommendations for Outpatient Follow-up:  1. Discharge home with plan on aortic valve replacement on 4/15 patient will report for preop evaluation.  Discharge Diagnoses:  Principal Problem:   Chest pain   Active Problems:   Aortic stenosis, severe   Elevated troponin   Pre-syncope   Chronic diastolic congestive heart failure   CHF (congestive heart failure)   Discharge Condition: Fair  Diet recommendation: Heart healthy  Filed Weights   06/21/14 0500 06/22/14 0709 06/23/14 0517  Weight: 88.27 kg (194 lb 9.6 oz) 85.367 kg (188 lb 3.2 oz) 83.19 kg (183 lb 6.4 oz)    History of present illness:  79 year old male with no prior medical history ( reports having a murmur since childhood) presented with chest pain after he was working in his yard and lifting the water hose. The pain was substernal and radiating across the chest lasting for a few minutes. As per the patient's wife he also complained of some dizziness and transient visual disturbance. The chest pain subsided after EMS arrived. In the ED EKG showed bigeminal rhythm and T-wave inversion in lateral leads with minimally elevated troponin. BNP was 1218 and mildly elevated d-dimer. Head CT was unremarkable a CT angiogram was negative for PE. Patient admitted to hospitalist service on telemetry. Cardiology consulted.  Hospital Course:  Chest pain with elevated troponin in the setting of severe aortic stenosis -no further symptoms. Troponin peaked at 0.6. EKG changes with T-wave inversion in lateral leads. significant systolic murmur .  -critical AS on 2 D echo which likely caused dizziness and Transient visual symptoms. -He continued on aspirin. Lipid panel normal. -Catheterization showing severe aortic stenosis with EF of 35-40% which is  likely nonischemic. -Cardiothoracic surgery consulted who labs on aortic valve replacement on 06/30/2014. Patient stable to be discharged home. He is recommended to avoid any formal stenosis activity and not drive until then.   Diet: Heart healthy       Code Status:full code  Communication: wife at bedside  Disposition Plan: Home with outpatient follow up in 1 week for aortic valve replacement.   Consultants:  Cardiology  Cardiothoracic surgery (Dr. Roxy Manns)   Procedures: 2d echo  cardiac cath on 4/7  Antibiotics: None   Discharge Exam: Filed Vitals:   06/23/14 1028  BP: 116/87  Pulse: 69  Temp: 98.1 F (36.7 C)  Resp: 18     General: Elderly male in no acute distress  HEENT: No pallor, moist oral mucosa, no JVD, supple neck  Cardiovascular: normal O2&U2, systolic murmur 4/6 more prominent over left second intercostal space  Chest: Clear bilaterally , no added sounds  Abdomen: soft, NT, ND  Musculoskeletal: warm , no edema  CNS: alert and oriented  Discharge Instructions    Current Discharge Medication List    CONTINUE these medications which have NOT CHANGED   Details  aspirin 325 MG tablet Take 325 mg by mouth daily.       No Known Allergies Follow-up Information    Follow up with Rexene Alberts, MD On 06/30/2014.   Specialty:  Cardiothoracic Surgery   Why:  on 4/14 for preop evalaution   Contact information:   [address] [place] [address] [phone]        The results of significant diagnostics from this hospitalization (including imaging, microbiology, ancillary  and laboratory) are listed below for reference.    Significant Diagnostic Studies: Dg Chest 2 View  06/19/2014   CLINICAL DATA:  Mid lower chest pain, onset today. Intermittent shortness of breath.  EXAM: CHEST  2 VIEW  COMPARISON:  Chest CT 06/04/2009  FINDINGS: Stable apparent cardiomegaly, accentuated by lower mediastinal fat and developmental  AP chest narrowing. Normal aortic and hilar contours. There is no edema, consolidation, effusion, or pneumothorax. Remote T11 superior endplate fracture.  IMPRESSION: No active cardiopulmonary disease.   Electronically Signed   By: Monte Fantasia M.D.   On: 06/19/2014 17:23   Ct Head Wo Contrast  06/19/2014   CLINICAL DATA:  Blurred vision and chest pain.  Syncope.  EXAM: CT HEAD WITHOUT CONTRAST  TECHNIQUE: Contiguous axial images were obtained from the base of the skull through the vertex without intravenous contrast.  COMPARISON:  01/06/2005  FINDINGS: Skull and Sinuses:Negative for fracture or destructive process. The mastoids, middle ears, and imaged paranasal sinuses are clear.  Orbits: Cataract resection bilaterally. No acute findings to explain blurred vision.  Brain: No evidence of acute infarction, hemorrhage, hydrocephalus, or intra-axial mass lesion/mass effect. Stable retro cerebellar CSF accumulation with mild mass effect on the vermis. Cerebral volume loss which is expected for age.  IMPRESSION: Negative head CT.   Electronically Signed   By: Monte Fantasia M.D.   On: 06/19/2014 20:04   Ct Angio Chest Pe W/cm &/or Wo Cm  06/19/2014   CLINICAL DATA:  Chest pain.  EXAM: CT ANGIOGRAPHY CHEST WITH CONTRAST  TECHNIQUE: Multidetector CT imaging of the chest was performed using the standard protocol during bolus administration of intravenous contrast. Multiplanar CT image reconstructions and MIPs were obtained to evaluate the vascular anatomy.  CONTRAST:  137mL OMNIPAQUE IOHEXOL 350 MG/ML SOLN  COMPARISON:  CT scan of chest of June 04, 2009.  FINDINGS: No pneumothorax or pleural effusion is noted. No acute pulmonary disease is noted. There is no evidence of pulmonary embolus. No mediastinal mass or adenopathy is noted. Visualized portion of upper abdomen appears normal. No significant osseous abnormality is noted.  Review of the MIP images confirms the above findings.  IMPRESSION: No evidence of  pulmonary embolus.   Electronically Signed   By: Marijo Conception, M.D.   On: 06/19/2014 20:12    Microbiology: No results found for this or any previous visit (from the past 240 hour(s)).   Labs: Basic Metabolic Panel:  Recent Labs Lab 06/19/14 1630 06/19/14 2134 06/20/14 0239 06/21/14 0439 06/22/14 0532 06/23/14 0256  NA 134*  --  137 138 138 137  K 4.2  --  4.2 3.9 4.0 4.3  CL 102  --  104 106 106 105  CO2 23  --  29 24 24 24   GLUCOSE 97  --  100* 113* 100* 85  BUN 10  --  8 11 7 9   CREATININE 1.27 1.18 1.24 1.26 1.22 1.26  CALCIUM 8.8  --  8.7 8.9 8.7 8.8   Liver Function Tests:  Recent Labs Lab 06/20/14 0239 06/21/14 0439 06/22/14 0532 06/23/14 0256  AST 23 14 17 15   ALT 16 13 15 14   ALKPHOS 59 57 57 57  BILITOT 1.1 1.0 1.3* 1.7*  PROT 6.6 5.7* 6.6 6.3  ALBUMIN 3.6 3.2* 3.2* 3.2*   No results for input(s): LIPASE, AMYLASE in the last 168 hours. No results for input(s): AMMONIA in the last 168 hours. CBC:  Recent Labs Lab 06/19/14 1630 06/19/14 2134 06/20/14 0239 06/21/14  4469 06/22/14 0532 06/23/14 0256  WBC 5.8 5.6 5.1 4.9 5.1 5.8  NEUTROABS 3.9  --  3.1 3.2 3.3 3.9  HGB 14.0 13.8 14.3 13.4 13.1 13.9  HCT 39.4 38.1* 40.3 36.9* 36.1* 38.0*  MCV 100.0 99.5 102.5* 105.4* 104.9* 105.0*  PLT 172 168 174 177 171 169   Cardiac Enzymes:  Recent Labs Lab 06/19/14 1630 06/19/14 2134 06/20/14 0239 06/20/14 1034  TROPONINI 0.05* 0.06* 0.05* 0.05*   BNP: BNP (last 3 results)  Recent Labs  06/19/14 1630  BNP 1282.9*    ProBNP (last 3 results) No results for input(s): PROBNP in the last 8760 hours.  CBG: No results for input(s): GLUCAP in the last 168 hours.     SignedLouellen Molder  [facility] 06/23/2014, 11:23 AM

## 2014-06-26 ENCOUNTER — Other Ambulatory Visit: Payer: Self-pay | Admitting: *Deleted

## 2014-06-26 DIAGNOSIS — I350 Nonrheumatic aortic (valve) stenosis: Secondary | ICD-10-CM

## 2014-06-29 ENCOUNTER — Encounter (HOSPITAL_COMMUNITY): Payer: Self-pay

## 2014-06-29 ENCOUNTER — Encounter (HOSPITAL_COMMUNITY)
Admit: 2014-06-29 | Discharge: 2014-06-29 | Disposition: A | Discharge status: Home / Self Care | Payer: Medicare Other | Attending: Thoracic Surgery (Cardiothoracic Vascular Surgery) | Admitting: Thoracic Surgery (Cardiothoracic Vascular Surgery)

## 2014-06-29 ENCOUNTER — Ambulatory Visit (HOSPITAL_COMMUNITY)
Admit: 2014-06-29 | Discharge: 2014-06-29 | Disposition: A | Discharge status: Home / Self Care | Payer: Medicare Other | Source: Ambulatory Visit | Attending: Internal Medicine | Admitting: Internal Medicine

## 2014-06-29 ENCOUNTER — Inpatient Hospital Stay (HOSPITAL_COMMUNITY)
Admission: RE | Admit: 2014-06-29 | Discharge: 2014-07-05 | DRG: 220 | Disposition: A | Discharge status: Home Health | Payer: Medicare Other | Source: Ambulatory Visit | Attending: Thoracic Surgery (Cardiothoracic Vascular Surgery) | Admitting: Thoracic Surgery (Cardiothoracic Vascular Surgery)

## 2014-06-29 VITALS — BP 113/60 | HR 58 | Temp 98.2°F | Resp 18 | Ht 73.0 in | Wt 187.6 lb

## 2014-06-29 DIAGNOSIS — Z79899 Other long term (current) drug therapy: Secondary | ICD-10-CM

## 2014-06-29 DIAGNOSIS — I454 Nonspecific intraventricular block: Secondary | ICD-10-CM | POA: Diagnosis not present

## 2014-06-29 DIAGNOSIS — Z9842 Cataract extraction status, left eye: Secondary | ICD-10-CM

## 2014-06-29 DIAGNOSIS — I350 Nonrheumatic aortic (valve) stenosis: Secondary | ICD-10-CM

## 2014-06-29 DIAGNOSIS — D62 Acute posthemorrhagic anemia: Secondary | ICD-10-CM | POA: Diagnosis not present

## 2014-06-29 DIAGNOSIS — I6523 Occlusion and stenosis of bilateral carotid arteries: Secondary | ICD-10-CM

## 2014-06-29 DIAGNOSIS — I5032 Chronic diastolic (congestive) heart failure: Secondary | ICD-10-CM | POA: Diagnosis not present

## 2014-06-29 DIAGNOSIS — I351 Nonrheumatic aortic (valve) insufficiency: Secondary | ICD-10-CM | POA: Diagnosis present

## 2014-06-29 DIAGNOSIS — E871 Hypo-osmolality and hyponatremia: Secondary | ICD-10-CM | POA: Diagnosis not present

## 2014-06-29 DIAGNOSIS — Z0183 Encounter for blood typing: Secondary | ICD-10-CM

## 2014-06-29 DIAGNOSIS — Z01818 Encounter for other preprocedural examination: Secondary | ICD-10-CM

## 2014-06-29 DIAGNOSIS — I509 Heart failure, unspecified: Secondary | ICD-10-CM

## 2014-06-29 DIAGNOSIS — Z952 Presence of prosthetic heart valve: Secondary | ICD-10-CM

## 2014-06-29 DIAGNOSIS — D6959 Other secondary thrombocytopenia: Secondary | ICD-10-CM | POA: Diagnosis present

## 2014-06-29 DIAGNOSIS — Z87891 Personal history of nicotine dependence: Secondary | ICD-10-CM

## 2014-06-29 DIAGNOSIS — Z452 Encounter for adjustment and management of vascular access device: Secondary | ICD-10-CM | POA: Diagnosis not present

## 2014-06-29 DIAGNOSIS — R55 Syncope and collapse: Secondary | ICD-10-CM | POA: Diagnosis present

## 2014-06-29 DIAGNOSIS — Q253 Supravalvular aortic stenosis: Secondary | ICD-10-CM | POA: Diagnosis not present

## 2014-06-29 DIAGNOSIS — I440 Atrioventricular block, first degree: Secondary | ICD-10-CM | POA: Diagnosis not present

## 2014-06-29 DIAGNOSIS — Z953 Presence of xenogenic heart valve: Secondary | ICD-10-CM

## 2014-06-29 DIAGNOSIS — J9811 Atelectasis: Secondary | ICD-10-CM

## 2014-06-29 DIAGNOSIS — Z01812 Encounter for preprocedural laboratory examination: Secondary | ICD-10-CM | POA: Insufficient documentation

## 2014-06-29 DIAGNOSIS — Z4682 Encounter for fitting and adjustment of non-vascular catheter: Secondary | ICD-10-CM | POA: Diagnosis not present

## 2014-06-29 DIAGNOSIS — Z9841 Cataract extraction status, right eye: Secondary | ICD-10-CM

## 2014-06-29 DIAGNOSIS — I517 Cardiomegaly: Secondary | ICD-10-CM | POA: Diagnosis not present

## 2014-06-29 DIAGNOSIS — J90 Pleural effusion, not elsewhere classified: Secondary | ICD-10-CM | POA: Diagnosis not present

## 2014-06-29 DIAGNOSIS — I358 Other nonrheumatic aortic valve disorders: Secondary | ICD-10-CM | POA: Diagnosis not present

## 2014-06-29 DIAGNOSIS — Z48812 Encounter for surgical aftercare following surgery on the circulatory system: Secondary | ICD-10-CM | POA: Diagnosis not present

## 2014-06-29 HISTORY — DX: Presence of xenogenic heart valve: Z95.3

## 2014-06-29 LAB — BLOOD GAS, ARTERIAL
ACID-BASE DEFICIT: 2.4 mmol/L — AB (ref 0.0–2.0)
BICARBONATE: 21 meq/L (ref 20.0–24.0)
Drawn by: 206361
FIO2: 0.21 %
O2 Saturation: 97.2 %
PH ART: 7.445 (ref 7.350–7.450)
Patient temperature: 98.6
TCO2: 22 mmol/L (ref 0–100)
pCO2 arterial: 31.1 mmHg — ABNORMAL LOW (ref 35.0–45.0)
pO2, Arterial: 81.4 mmHg (ref 80.0–100.0)

## 2014-06-29 LAB — URINALYSIS, ROUTINE W REFLEX MICROSCOPIC
Bilirubin Urine: NEGATIVE
GLUCOSE, UA: NEGATIVE mg/dL
Hgb urine dipstick: NEGATIVE
Ketones, ur: NEGATIVE mg/dL
Leukocytes, UA: NEGATIVE
Nitrite: NEGATIVE
PROTEIN: NEGATIVE mg/dL
Specific Gravity, Urine: 1.017 (ref 1.005–1.030)
UROBILINOGEN UA: 0.2 mg/dL (ref 0.0–1.0)
pH: 5 (ref 5.0–8.0)

## 2014-06-29 LAB — COMPREHENSIVE METABOLIC PANEL
ALT: 12 U/L (ref 0–53)
ANION GAP: 10 (ref 5–15)
AST: 16 U/L (ref 0–37)
Albumin: 3.9 g/dL (ref 3.5–5.2)
Alkaline Phosphatase: 58 U/L (ref 39–117)
BILIRUBIN TOTAL: 1 mg/dL (ref 0.3–1.2)
BUN: 10 mg/dL (ref 6–23)
CO2: 18 mmol/L — ABNORMAL LOW (ref 19–32)
Calcium: 8.8 mg/dL (ref 8.4–10.5)
Chloride: 107 mmol/L (ref 96–112)
Creatinine, Ser: 1.17 mg/dL (ref 0.50–1.35)
GFR calc Af Amer: 68 mL/min — ABNORMAL LOW (ref >90)
GFR, EST NON AFRICAN AMERICAN: 59 mL/min — AB (ref >90)
Glucose, Bld: 98 mg/dL (ref 70–99)
Potassium: 4.5 mmol/L (ref 3.5–5.1)
Sodium: 135 mmol/L (ref 135–145)
TOTAL PROTEIN: 7.1 g/dL (ref 6.0–8.3)

## 2014-06-29 LAB — SURGICAL PCR SCREEN
MRSA, PCR: NEGATIVE
Staphylococcus aureus: NEGATIVE

## 2014-06-29 LAB — TYPE AND SCREEN
ABO/RH(D): B NEG
ANTIBODY SCREEN: NEGATIVE

## 2014-06-29 LAB — CBC
HCT: 41.8 % (ref 39.0–52.0)
Hemoglobin: 14.4 g/dL (ref 13.0–17.0)
MCH: 35 pg — AB (ref 26.0–34.0)
MCHC: 34.4 g/dL (ref 30.0–36.0)
MCV: 101.5 fL — AB (ref 78.0–100.0)
PLATELETS: 222 10*3/uL (ref 150–400)
RBC: 4.12 MIL/uL — ABNORMAL LOW (ref 4.22–5.81)
RDW: 12.1 % (ref 11.5–15.5)
WBC: 5.9 10*3/uL (ref 4.0–10.5)

## 2014-06-29 LAB — APTT: APTT: 35 s (ref 24–37)

## 2014-06-29 LAB — PROTIME-INR
INR: 1.11 (ref 0.00–1.49)
Prothrombin Time: 14.4 seconds (ref 11.6–15.2)

## 2014-06-29 LAB — ABO/RH: ABO/RH(D): B NEG

## 2014-06-29 MED ORDER — CEFUROXIME SODIUM 1.5 G IJ SOLR
1.5000 g | INTRAMUSCULAR | Status: AC
  Administered 2014-06-30: .75 g via INTRAVENOUS
  Administered 2014-06-30: 1.5 g via INTRAVENOUS
  Filled 2014-06-29 (×2): qty 1.5

## 2014-06-29 MED ORDER — SODIUM CHLORIDE 0.9 % IV SOLN
1250.0000 mg | INTRAVENOUS | Status: AC
  Administered 2014-06-30: 1250 mg via INTRAVENOUS
  Filled 2014-06-29: qty 1250

## 2014-06-29 MED ORDER — CHLORHEXIDINE GLUCONATE 4 % EX LIQD
30.0000 mL | CUTANEOUS | Status: DC
  Filled 2014-06-29: qty 30

## 2014-06-29 MED ORDER — POTASSIUM CHLORIDE 2 MEQ/ML IV SOLN
80.0000 meq | INTRAVENOUS | Status: DC
  Filled 2014-06-29: qty 40

## 2014-06-29 MED ORDER — DOPAMINE-DEXTROSE 3.2-5 MG/ML-% IV SOLN
0.0000 ug/kg/min | INTRAVENOUS | Status: DC
  Filled 2014-06-29: qty 250

## 2014-06-29 MED ORDER — DEXTROSE 5 % IV SOLN
0.0000 ug/min | INTRAVENOUS | Status: DC
  Filled 2014-06-29: qty 4

## 2014-06-29 MED ORDER — SODIUM CHLORIDE 0.9 % IV SOLN
INTRAVENOUS | Status: DC
  Filled 2014-06-29: qty 30

## 2014-06-29 MED ORDER — MAGNESIUM SULFATE 50 % IJ SOLN
40.0000 meq | INTRAMUSCULAR | Status: DC
  Filled 2014-06-29: qty 10

## 2014-06-29 MED ORDER — PLASMA-LYTE 148 IV SOLN
INTRAVENOUS | Status: DC
Start: 2014-06-30 — End: 2014-06-30
  Filled 2014-06-29: qty 2.5

## 2014-06-29 MED ORDER — DEXTROSE 5 % IV SOLN
750.0000 mg | INTRAVENOUS | Status: DC
  Filled 2014-06-29: qty 750

## 2014-06-29 MED ORDER — METOPROLOL TARTRATE 12.5 MG HALF TABLET: 12.5000 mg | ORAL_TABLET | Freq: Once | ORAL | Status: DC

## 2014-06-29 MED ORDER — PHENYLEPHRINE HCL 10 MG/ML IJ SOLN
30.0000 ug/min | INTRAVENOUS | Status: DC
  Filled 2014-06-29: qty 2

## 2014-06-29 MED ORDER — SODIUM CHLORIDE 0.9 % IV SOLN
INTRAVENOUS | Status: DC
  Filled 2014-06-29: qty 2.5

## 2014-06-29 MED ORDER — SODIUM CHLORIDE 0.9 % IV SOLN
INTRAVENOUS | Status: DC
  Filled 2014-06-29: qty 40

## 2014-06-29 MED ORDER — NITROGLYCERIN IN D5W 200-5 MCG/ML-% IV SOLN
2.0000 ug/min | INTRAVENOUS | Status: DC
  Filled 2014-06-29: qty 250

## 2014-06-29 MED ORDER — DEXMEDETOMIDINE HCL IN NACL 400 MCG/100ML IV SOLN
0.1000 ug/kg/h | INTRAVENOUS | Status: DC
  Filled 2014-06-29: qty 100

## 2014-06-29 MED ORDER — VANCOMYCIN HCL 1000 MG IV SOLR
INTRAVENOUS | Status: DC
  Filled 2014-06-29: qty 1000

## 2014-06-29 NOTE — H&P (Signed)
[name]          CARDIOTHORACIC SURGERY HISTORY AND PHYSICAL EXAM  PCP is Erik Gravel, MD Referring Provider is Erik Sine, MD  Reason for consultation: Severe aortic stenosis  HPI:  Patient is a 79 year old male who has been remarkably healthy for all of his life although many years ago he was told that he had a heart murmur on physical exam. He denies any previous knowledge of the diagnosis of aortic stenosis, although he thinks he may have had an echocardiogram done in the remote past. He has remained physically active and completely functionally independent without any significant limitations until recently. He states that over the last 6 months or so he has developed some mild exertional shortness of breath and fatigue. This would only develop with relatively strenuous exertion. He otherwise felt well until 06/19/2014 when he developed sudden onset of sharp substernal chest tightness and dizziness. At the time he had been working in his yard doing some fairly strenuous activity. He had to sit down for fear of passing out. His symptoms resolved within 5-10 minutes. He presented to the emergency department where troponin levels were minimally positive at 0.05 and BNP level was elevated to 1280. Head CT scan was normal. CT angiogram of the chest was negative for pulmonary embolus. Cardiology consultation was requested and the patient was evaluated by Dr. Georgina [name]. He was found to have a harsh, late peaking systolic murmur suspicious for severe aortic stenosis. Transthoracic echocardiogram confirmed the presence of critical aortic stenosis with normal left ventricular systolic function. Peak velocity across the aortic valve measured in excess of 5.4 m/s corresponding to peak and mean transvalvular gradient estimated 119 and 58 mmHg, respectively. Left ventricular ejection fraction was estimated at 50-55%. Left and  right heart catheterization was performed by Dr. Claiborne [name] on 06/22/2014. This confirmed the presence of severe aortic stenosis with peak to peak and mean transvalvular gradients measured 66 and 44 mmHg, respectively. Aortic valve area was calculated 0.58 cm. There was normal coronary artery anatomy with no significant coronary artery disease. Pulmonary artery pressures were normal. Cardiothoracic surgical consultation was requested.  The patient is married and lives locally in [place] with his wife. 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 He has remained physically active and busy during retirement. He works part-time for his [employer]. He reports no physical limitations whatsoever until recently. He has recently developed new onset symptoms of exertional shortness of breath that are only associated with relatively strenuous activity.  Past Medical History  Diagnosis Date  . Heart murmur   . Chest pain 06/19/2014  . Aortic stenosis, severe   . Chronic diastolic congestive heart failure     Past Surgical History  Procedure Laterality Date  . Appendectomy    . Hand surgery    . Cataract extraction Bilateral ? 2013 & 2014  . Left and right heart catheterization with coronary angiogram N/A 06/22/2014    Procedure: LEFT AND RIGHT HEART CATHETERIZATION WITH CORONARY ANGIOGRAM; Surgeon: Erik Sine, MD; Location: [facility] CATH LAB; Service: Cardiovascular; Laterality: N/A;    History reviewed. No pertinent family history.  History   Social History  . Marital Status: Married    Spouse Name: N/A  . Number of Children: N/A  . Years  of Education: N/A   Occupational History  . Not on file.   Social History Main Topics  . Smoking status: Former Research scientist (life sciences)  . Smokeless tobacco: Never Used  . Alcohol Use: Yes     Comment: occ   . Drug  Use: No  . Sexual Activity: Not on file   Other Topics Concern  . Not on file   Social History Narrative    Prior to Admission medications   Medication Sig Start Date End Date Taking? Authorizing Provider  aspirin 325 MG tablet Take 325 mg by mouth daily.   Yes Historical Provider, MD    Current Facility-Administered Medications  Medication Dose Route Frequency Provider Last Rate Last Dose  . 0.9 % sodium chloride infusion  Intravenous Continuous Rogelia Mire, NP 75 mL/hr at 06/22/14 0539 75 mL/hr at 06/22/14 0539  . acetaminophen (TYLENOL) tablet 650 mg 650 mg Oral Q4H PRN Erik Sine, MD    . aspirin EC tablet 81 mg 81 mg Oral Daily Erik Sine, MD  81 mg at 06/23/14 5643  . heparin injection 5,000 Units 5,000 Units Subcutaneous 3 times per day Deneise Lever, MD  5,000 Units at 06/19/14 2235  . nitroGLYCERIN (NITROSTAT) SL tablet 0.4 mg 0.4 mg Sublingual Q5 min PRN Deneise Lever, MD    . ondansetron [facility]) injection 4 mg 4 mg Intravenous Q6H PRN Erik Sine, MD    . sodium chloride 0.9 % injection 3 mL 3 mL Intravenous Q12H Deneise Lever, MD  3 mL at 06/23/14 3295    No Known Allergies    Review of Systems:  General:normal appetite, normal energy, no weight gain, no weight loss, no fever Cardiac:+ chest pain with exertion, no chest pain at rest, + SOB with exertion, no resting SOB, no PND, no orthopnea, no palpitations, no arrhythmia, no atrial fibrillation, no LE edema, + dizzy spells, 1 near syncope episode Respiratory:no shortness of breath, no home oxygen, no productive cough, no dry cough, no bronchitis, no wheezing, no hemoptysis, no asthma, no pain with inspiration or cough, no sleep apnea, no CPAP at  night GI:no difficulty swallowing, no reflux, no frequent heartburn, no hiatal hernia, no abdominal pain, no constipation, no diarrhea, no hematochezia, no hematemesis, no melena GU:no dysuria, no frequency, no urinary tract infection, no hematuria, no enlarged prostate, no kidney stones, no kidney disease Vascular:no pain suggestive of claudication, no pain in feet, no leg cramps, no varicose veins, no DVT, no non-healing foot ulcer Neuro:no stroke, no TIA's, no seizures, no headaches, no temporary blindness one eye, no slurred speech, no peripheral neuropathy, no chronic pain, no instability of gait, no memory/cognitive dysfunction Musculoskeletal:no arthritis, no joint swelling, no myalgias, no difficulty walking, normal mobility  Skin:no rash, no itching, no skin infections, no pressure sores or ulcerations Psych:no anxiety, no depression, no nervousness, no unusual recent stress Eyes:no blurry vision, no floaters, no recent vision changes, does not wear glasses or contacts ENT:no hearing loss, no loose or painful teeth, no dentures, last saw dentist within the past year Hematologic:no easy bruising, no abnormal bleeding, no clotting disorder, no frequent epistaxis Endocrine:no diabetes, does not check CBG's at home   Physical Exam:  BP 116/87 mmHg  Pulse 69  Temp(Src) 98.1 F (36.7 C) (Oral)  Resp 18  Ht 6\' 1"  (1.854 m)  Wt 83.19 kg (183 lb 6.4 oz)  BMI 24.20 kg/m2  SpO2 98% General:  well-appearing HEENT:Unremarkable  Neck:no JVD, no bruits, no adenopathy  Chest:clear to auscultation, symmetrical breath sounds, no wheezes, no rhonchi  CV:RRR, harsh, late-peaking systolic murmur best RUSB Abdomen:soft, non-tender, no masses  Extremities:warm, well-perfused, pulses palpable, no lower extremity edema Rectal/GUDeferred Neuro:Grossly non-focal and symmetrical throughout Skin:Clean and dry, no rashes, no breakdown  Diagnostic Tests:  Transthoracic Echocardiography  Patient:  Erik [name], Erik [name] MR #:    [id number] Study Date: 06/21/2014 Gender:   M Age:    13 Height:   185.4 cm Weight:   86.2 kg BSA:    2.11 m^2 Pt. Status: Room:    [place]  Deneise Lever ATTENDING  Dhungel, Nishant [id number] PERFORMING  [facility], Inpatient SONOGRAPHER Darlina Sicilian, RDCS  cc:  ------------------------------------------------------------------- LV EF: 50% -  55%  ------------------------------------------------------------------- Indications:   Chest pain 786.51.  ------------------------------------------------------------------- History:  PMH: Pre-Syncope. Elevated Troponin. Murmur.  ------------------------------------------------------------------- Study Conclusions  - Left ventricle: The cavity size was normal. Wall thickness was increased in a pattern of moderate LVH. Systolic function was normal. The estimated ejection fraction was in the range of 50% to 55%. Wall motion was normal; there were no regional wall motion abnormalities. Features are consistent with a  pseudonormal left ventricular filling pattern, with concomitant abnormal relaxation and increased filling pressure (grade 2 diastolic dysfunction). - Aortic valve: There was critical stenosis. Valve area (VTI): 0.44 cm^2. Valve area (Vmax): 0.4 cm^2. Valve area (Vmean): 0.42 cm^2. - Mitral valve: Calcified annulus. Mildly thickened leaflets . - Right atrium: The atrium was mildly dilated.  Impressions:  - Critical aortic stenosis.  Transthoracic echocardiography. M-mode, complete 2D, spectral Doppler, and color Doppler. Birthdate: Patient birthdate: [birth year]. Age: Patient is 79 yr old. Sex: Gender: male. BMI: 25.1 kg/m^2. Blood pressure:   130/94 Patient status: Inpatient. Study date: Study date: 06/21/2014. Study time: 09:24 AM. Location: Echo laboratory.  -------------------------------------------------------------------  ------------------------------------------------------------------- Left ventricle: The cavity size was normal. Wall thickness was increased in a pattern of moderate LVH. Systolic function was normal. The estimated ejection fraction was in the range of 50% to 55%. Wall motion was normal; there were no regional wall motion abnormalities. Features are consistent with a pseudonormal left ventricular filling pattern, with concomitant abnormal relaxation and increased filling pressure (grade 2 diastolic dysfunction).  ------------------------------------------------------------------- Aortic valve:  Severely thickened, severely calcified leaflets. Doppler:  There was critical stenosis.   VTI ratio of LVOT to aortic valve: 0.14. Valve area (VTI): 0.44 cm^2. Indexed valve area (VTI): 0.21 cm^2/m^2. Valve area (Vmax): 0.4 cm^2. Indexed valve area (Vmax): 0.19 cm^2/m^2. Mean velocity ratio of LVOT to aortic valve: 0.13. Valve area (Vmean): 0.42 cm^2. Indexed valve area (Vmean): 0.2 cm^2/m^2.  Mean gradient (S): 58 mm Hg.  Peak gradient (S): 119 mm Hg.  ------------------------------------------------------------------- Aorta: The aorta was normal, not dilated, and non-diseased.  ------------------------------------------------------------------- Mitral valve:  Calcified annulus. Mildly thickened leaflets . Doppler: There was trivial regurgitation.  Peak gradient (D): 4 mm Hg.  ------------------------------------------------------------------- Left atrium: The atrium was normal in size.  ------------------------------------------------------------------- Right ventricle: The cavity size was normal. Wall thickness was normal. Systolic function was normal.  ------------------------------------------------------------------- Pulmonic valve:  Poorly visualized. Doppler: There was no significant regurgitation.  ------------------------------------------------------------------- Tricuspid valve:  Structurally normal valve.  Leaflet separation was normal. Doppler: Transvalvular velocity was within the normal range. There was no regurgitation.  ------------------------------------------------------------------- Right atrium: The atrium was mildly dilated.  ------------------------------------------------------------------- Pericardium: There was no pericardial effusion.  ------------------------------------------------------------------- Systemic veins: Inferior vena cava: The vessel was normal in size. The respirophasic diameter changes were  in the normal range (>= 50%), consistent with normal central venous pressure.  ------------------------------------------------------------------- Post procedure conclusions Ascending Aorta:  - The aorta was normal, not dilated, and non-diseased.  ------------------------------------------------------------------- Measurements  Left ventricle              Value     Reference LV ID, ED, PLAX chordal           47.1 mm    43 - 52 LV ID, ES, PLAX chordal          34.1 mm    23 - 38 LV fx shortening, PLAX chordal  (L)   28  %    >=29 LV PW thickness, ED            11  mm    --------- IVS/LV PW ratio, ED            1.26      <=1.3 Stroke volume, 2D             54  ml    --------- Stroke volume/bsa, 2D           26  ml/m^2  --------- LV e&', lateral              6.57 cm/s   --------- LV E/e&', lateral             15.98     --------- LV e&', medial               4.38 cm/s   --------- LV E/e&', medial              23.97     --------- LV e&', average              5.48 cm/s   --------- LV E/e&', average             19.18     ---------  Ventricular septum            Value     Reference IVS thickness, ED             13.9 mm    ---------  LVOT                   Value     Reference LVOT ID, S                20  mm    --------- LVOT area                 3.14 cm^2   --------- LVOT peak velocity, S           68.7 cm/s   --------- LVOT mean velocity, S           45.4 cm/s   --------- LVOT VTI, S                17.1 cm    --------- LVOT peak gradient, S           2   mm Hg  ---------  Aortic valve               Value     Reference Aortic valve mean velocity, S       337  cm/s   --------- Aortic valve VTI, S            122  cm    --------- Aortic mean gradient, S  58  mm Hg  --------- Aortic peak gradient, S          119  mm Hg  --------- VTI ratio, LVOT/AV            0.14      --------- Aortic valve area, VTI          0.44 cm^2    --------- Aortic valve area/bsa, VTI        0.21 cm^2/m^2 --------- Aortic valve area, peak velocity     0.4  cm^2   --------- Aortic valve area/bsa, peak        0.19 cm^2/m^2 --------- velocity Velocity ratio, mean, LVOT/AV       0.13      --------- Aortic valve area, mean velocity     0.42 cm^2   --------- Aortic valve area/bsa, mean        0.2  cm^2/m^2 --------- velocity  Aorta                   Value     Reference Aortic root ID, ED            30  mm    ---------  Left atrium                Value     Reference LA ID, A-P, ES              36  mm    --------- LA ID/bsa, A-P              1.7  cm/m^2  <=2.2 LA volume, ES, 1-p A4C          79  ml    --------- LA volume/bsa, ES, 1-p A4C        37.4 ml/m^2  --------- LA volume, ES, 1-p A2C          39.2 ml    --------- LA volume/bsa, ES, 1-p A2C        18.5 ml/m^2  ---------  Mitral valve               Value     Reference Mitral E-wave peak velocity        105  cm/s   --------- Mitral A-wave peak velocity        34.4 cm/s   --------- Mitral deceleration time         180  ms    150 - 230 Mitral peak gradient, D          4   mm Hg  --------- Mitral E/A ratio, peak          3.1      ---------  Systemic veins              Value     Reference Estimated CVP               3   mm Hg  ---------  Right ventricle              Value     Reference RV s&', lateral, S             12.3 cm/s   ---------  Legend: (L) and (H) mark values outside specified reference range.  ------------------------------------------------------------------- Prepared and Electronically  Authenticated by  Darlin Coco 2016-04-06T13:11:14   RIGHT AND LEFT HEART CARDIAC CATHETERIZATION   HISTORY:  Erik [name] is a 79 y.o. male recently experiencing a brief episode of chest discomfort  associated with presyncope with transient visual blurring. Physical examination suggested a murmur of severe aortic stenosis. An echo Doppler study revealed critical aortic stenosis. He is now referred for right and left heart catheterization.   PROCEDURE: Right and left heart catheterization: Swan-Ganz catheterization, cardiac Determination by the thermodilution and Fick methods, coronary angiography, left ventriculography, supravalvular aortography.  The patient was brought to the second floor [facility] cath lab in the postabsorptive state. Versed 2 mg and fentanyl 50 mcg were administered for conscious sedation. The right groin was prepped and draped in sterile fashion and a 5 [place] arterial sheath and 7 French venous sheath were inserted without difficulty. A Swan-Ganz catheter was advanced into the venous sheath and pressures were obtained in the right atrium, right ventricle, pulmonary artery, and pulmonary capillary wedge position. Cardiac outputs were obtained by the thermodilution and assumed Fick methods. Oxygen saturation was obtained in the pulmonary artery and aorta. A pigtail catheter was inserted and simultaneous AO/PA pressures were recorded. Supravalvular aortography was performed. The pigtail catheter was then removed and diagnostic catheterization to delineate the coronary anatomy was performed utilizing 5 French Judkins 4 left and right diagnostic catheters. With the RCA catheter and with mobility to one leaflet clearly visible. One brief attempt was made to cross the valve and this was able to be done fairly promptly without difficulty utilizing a straight wire. The RCA catheter was then exchanged for a pigtail catheter. Simultaneous FA and LV pressures and  left ventricular and PCW pressures were recorded. Left ventriculography was performed using 25 cc of contrast at a flow rate of 12 cc/s. A careful LV to AO pullback was performed. Simultaneous FA and AO pressures were recorded, which revealed a 10 mm difference between the higher FA pressure in the central aorta. All catheters were removed and the patient. Hemostasis was obtained by direct manual pressure. The patient tolerated the procedure well and returned to his room in satisfactory condition.   HEMODYNAMICS:   RA: a 4 Mean 3 RV: 30/6 PA: 29/12 PC: 18, V 19; mean 15  AO: 105/56 PA: 33/12   LV: 178/29 AO: 109/58    Oxygen saturation in the aorta 92% and the pulmonary artery 63%  Cardiac output: 4 l/min (Thermo); 5.5 (Fick)  Cardiac index: 1.9 l/m/m2 2.6  AV: peak to peak 66-69 mm HG; Mean 44.2 AVA 0.58 cm2  ANGIOGRAPHY:   Fluoroscopy revealed significant calcification of the aortic valve. There was significantly reduced excursion, but visible more pronounced motion of one leaflet.  Left main: Angiographically normal vessel which bifurcated into the LAD and left circumflex coronary artery.  LAD: Large normal vessel which gave rise to 2 diagonal vessels and several septal perforating arteries. A portion of the LAD seemed to dip intramyocardially. There was no evidence for stenosis or muscle bridging.  Left circumflex: Angiographically normal vessel which gave rise to 2 marginal branches.   Right coronary artery: Large caliber vessel that had smooth 20% proximal focal narrowing. The vessel supplied the PDA noted in the posterior lateral branch.  Left ventriculography revealed moderately reduced global LV function with ejection fraction of 35-40%.  Supravalvular aortography revealed an upper normal aortic root, without poststenotic dilatation. There did not appear to be any significant aortic insufficiency. Aortic valve excursion was reduced, but  one leaflet moved more prominently.   IMPRESSION:  Critical aortic valvular stenosis.  No significant coronary obstructive disease with smooth 20% luminal narrowing of the proximal RCA and otherwise normal appearing coronary arteries.  Moderate global LV dysfunction with an ejection fraction of 35-40%.  Upper normal right heart pressures.  RECOMMENDATION:  Aortic valve replacement surgery. Cardiothoracic surgery consultation was requested.   Erik Sine, MD, [facility] 06/22/2014 5:00 PM            CT ANGIOGRAPHY CHEST WITH CONTRAST  TECHNIQUE: Multidetector CT imaging of the chest was performed using the standard protocol during bolus administration of intravenous contrast. Multiplanar CT image reconstructions and MIPs were obtained to evaluate the vascular anatomy.  CONTRAST: 133mL OMNIPAQUE IOHEXOL 350 MG/ML SOLN  COMPARISON: CT scan of chest of June 04, 2009.  FINDINGS: No pneumothorax or pleural effusion is noted. No acute pulmonary disease is noted. There is no evidence of pulmonary embolus. No mediastinal mass or adenopathy is noted. Visualized portion of upper abdomen appears normal. No significant osseous abnormality is noted.  Review of the MIP images confirms the above findings.  IMPRESSION: No evidence of pulmonary embolus.   Electronically Signed  By: Marijo Conception, M.D.  On: 06/19/2014 20:12    Impression:  Patient has stage D severe symptomatic aortic stenosis. He presents with recent onset symptoms of exertional shortness of breath that occur only with relatively strenuous activity. He suffered a near syncopal episode several days ago that occured while the patient had been working strenuously in his yard. I have personally reviewed the patient's transthoracic echocardiogram, diagnostic cardiac catheterization, and CT angiogram of the chest. The patient has critical aortic stenosis with severe calcification, thickening,  and leaflet restriction involving all 3 leaflets of the aortic valve. The aortic valve may be functionally bicuspid. There is severe aortic stenosis with peak velocity across the aortic valve measured well above 5 m/s. Left ventricular systolic function appears mildly reduced. The patient does not have significant artery artery disease and right sided pressures were normal. Risks associated with conventional surgical aortic valve replacement should be relatively low. However, CT angiogram of the chest and aortic root injection during catheterization both demonstrate a fairly significant amount of calcification involving the lateral wall of the ascending thoracic aorta. This calcification is not circumferential and does not appear to be necessarily prohibitive to conventional surgery, but it certainly could increase the risks of stroke, bleeding, or aortic dissection.    Plan:  The patient and his wife were counseled at length regarding treatment alternatives for management of severe symptomatic aortic stenosis. Alternative approaches such as conventional aortic valve replacement, transcatheter aortic valve replacement, and palliative medical therapy were compared and contrasted at length. The natural history of severe symptomatic aortic stenosis was discussed, as was the poor prognosis associated with medical therapy. The risks associated with conventional surgical aortic valve replacement were been discussed in detail, as were expectations for post-operative convalescence. The implications regarding the presence of calcification in the ascending aorta was discussed. The relative risks and benefits of transcatheter aortic valve replacement as an alternative were discussed. This discussion was placed in the context of the patient's own specific clinical presentation, past medical history and life expectancy. Discussion was held comparing the relative risks of mechanical valve replacement with need for  lifelong anticoagulation versus use of a bioprosthetic tissue valve and the associated potential for late structural valve deterioration and failure. This discussion was placed in the context of the patient's particular circumstances, and as a result the patient specifically requests that their valve be replaced using a bioprosthetic tissue valve using conventional open surgical techniques. All of their questions been addressed.   The patient understands and  accepts all potential associated risks of surgery including but not limited to risk of death, stroke, myocardial infarction, congestive heart failure, respiratory failure, renal failure, pneumonia, bleeding requiring blood transfusion and or reexploration, arrhythmia, heart block or bradycardia requiring permanent pacemaker, aortic dissection or other major vascular complication, pleural effusions or other delayed complications related to continued congestive heart failure, and other late complications related to valve replacement including structural valve deterioration and failure, thrombosis, endocarditis, or paravalvular leak. We tentatively plan to proceed with surgery on Friday, 06/30/2014. I think it is reasonable to let the patient go home during the interim period of time. I have instructed the patient to avoid any kind of strenuous activity between now and the time of surgery. He should not drive an automobile.   I spent in excess of 120 minutes during the conduct of this hospital consultation and >50% of this time involved direct face-to-face encounter for counseling and/or coordination of the patient's care.    Valentina Gu. Roxy Manns, MD 06/23/2014 11:10 AM

## 2014-06-29 NOTE — Pre-Procedure Instructions (Signed)
Lamichael Youkhana  06/29/2014   Your procedure is scheduled on:  Friday, April 15  Report to [facility] Admitting at 0530 AM.  Call this number if you have problems the morning of surgery: [phone]   Remember:   Do not eat food or drink liquids after midnight.   Take these medicines the morning of surgery with A SIP OF WATER: none   Do not wear jewelry.  Do not wear lotions, powders, or perfumes. Do not wear deodorant.  Do not shave 48 hours prior to surgery. Men may shave face and neck.  Do not bring valuables to the hospital.  [facility] is not responsible    for any belongings or valuables.               Contacts, dentures or bridgework may not be worn into surgery.  Leave suitcase in the car. After surgery it may be brought to your room.  For patients admitted to the hospital, discharge time is determined by your   treatment team.                 Special Instructions: Follow instructions for showers in " Preparing for Surgery " fact sheet   Please read over the following fact sheets that you were given: Pain Booklet, Coughing and Deep Breathing, Blood Transfusion Information, Open Heart Packet and Surgical Site Infection Prevention

## 2014-06-29 NOTE — Progress Notes (Signed)
Pre-op Cardiac Surgery  Carotid Findings:  1-39% ICA stenosis.  Vertebral artery flow is antegrade.   Upper Extremity Right Left  Brachial Pressures 113T 117T  Radial Waveforms T T  Ulnar Waveforms T T  Palmar Arch (Allen's Test) Doppler signal remains normal with radial compression but diminishes >50% with ulnar compression Doppler signal remains normal with radial compression but diminishes >50% with ulnar compression   Findings:      Lower  Extremity Right Left  Dorsalis Pedis    Anterior Tibial    Posterior Tibial    Ankle/Brachial Indices      Findings:

## 2014-06-30 ENCOUNTER — Inpatient Hospital Stay (HOSPITAL_COMMUNITY): Payer: Medicare Other | Admitting: Anesthesiology

## 2014-06-30 ENCOUNTER — Encounter (HOSPITAL_COMMUNITY)
Admission: RE | Disposition: A | Discharge status: Home Health | Payer: Medicare Other | Source: Ambulatory Visit | Attending: Thoracic Surgery (Cardiothoracic Vascular Surgery)

## 2014-06-30 ENCOUNTER — Encounter (HOSPITAL_COMMUNITY): Payer: Self-pay | Admitting: *Deleted

## 2014-06-30 ENCOUNTER — Inpatient Hospital Stay (HOSPITAL_COMMUNITY): Payer: Medicare Other

## 2014-06-30 DIAGNOSIS — I350 Nonrheumatic aortic (valve) stenosis: Secondary | ICD-10-CM

## 2014-06-30 DIAGNOSIS — Z953 Presence of xenogenic heart valve: Secondary | ICD-10-CM

## 2014-06-30 HISTORY — PX: AORTIC VALVE REPLACEMENT: SHX41

## 2014-06-30 HISTORY — DX: Presence of xenogenic heart valve: Z95.3

## 2014-06-30 HISTORY — PX: INTRAOPERATIVE TRANSESOPHAGEAL ECHOCARDIOGRAM: SHX5062

## 2014-06-30 LAB — POCT I-STAT, CHEM 8
BUN: 8 mg/dL (ref 6–23)
BUN: 8 mg/dL (ref 6–23)
BUN: 6 mg/dL (ref 6–23)
BUN: 7 mg/dL (ref 6–23)
BUN: 8 mg/dL (ref 6–23)
BUN: 9 mg/dL (ref 6–23)
CALCIUM ION: 1.15 mmol/L (ref 1.13–1.30)
CALCIUM ION: 1.13 mmol/L (ref 1.13–1.30)
CREATININE: 0.8 mg/dL (ref 0.50–1.35)
CREATININE: 0.9 mg/dL (ref 0.50–1.35)
CREATININE: 1 mg/dL (ref 0.50–1.35)
Calcium, Ion: 1.25 mmol/L (ref 1.13–1.30)
Calcium, Ion: 1.21 mmol/L (ref 1.13–1.30)
Calcium, Ion: 1.02 mmol/L — ABNORMAL LOW (ref 1.13–1.30)
Calcium, Ion: 1.16 mmol/L (ref 1.13–1.30)
Chloride: 103 mmol/L (ref 96–112)
Chloride: 104 mmol/L (ref 96–112)
Chloride: 102 mmol/L (ref 96–112)
Chloride: 99 mmol/L (ref 96–112)
Chloride: 103 mmol/L (ref 96–112)
Chloride: 107 mmol/L (ref 96–112)
Creatinine, Ser: 1 mg/dL (ref 0.50–1.35)
Creatinine, Ser: 0.8 mg/dL (ref 0.50–1.35)
Creatinine, Ser: 0.7 mg/dL (ref 0.50–1.35)
GLUCOSE: 124 mg/dL — AB (ref 70–99)
GLUCOSE: 130 mg/dL — AB (ref 70–99)
GLUCOSE: 148 mg/dL — AB (ref 70–99)
Glucose, Bld: 105 mg/dL — ABNORMAL HIGH (ref 70–99)
Glucose, Bld: 119 mg/dL — ABNORMAL HIGH (ref 70–99)
Glucose, Bld: 104 mg/dL — ABNORMAL HIGH (ref 70–99)
HCT: 31 % — ABNORMAL LOW (ref 39.0–52.0)
HEMATOCRIT: 34 % — AB (ref 39.0–52.0)
HEMATOCRIT: 26 % — AB (ref 39.0–52.0)
HEMATOCRIT: 26 % — AB (ref 39.0–52.0)
HEMATOCRIT: 26 % — AB (ref 39.0–52.0)
HEMATOCRIT: 31 % — AB (ref 39.0–52.0)
HEMOGLOBIN: 10.5 g/dL — AB (ref 13.0–17.0)
HEMOGLOBIN: 8.8 g/dL — AB (ref 13.0–17.0)
HEMOGLOBIN: 8.8 g/dL — AB (ref 13.0–17.0)
HEMOGLOBIN: 10.5 g/dL — AB (ref 13.0–17.0)
Hemoglobin: 11.6 g/dL — ABNORMAL LOW (ref 13.0–17.0)
Hemoglobin: 8.8 g/dL — ABNORMAL LOW (ref 13.0–17.0)
POTASSIUM: 4.2 mmol/L (ref 3.5–5.1)
POTASSIUM: 4.2 mmol/L (ref 3.5–5.1)
POTASSIUM: 4.1 mmol/L (ref 3.5–5.1)
Potassium: 4.9 mmol/L (ref 3.5–5.1)
Potassium: 4.3 mmol/L (ref 3.5–5.1)
Potassium: 4.6 mmol/L (ref 3.5–5.1)
SODIUM: 139 mmol/L (ref 135–145)
SODIUM: 138 mmol/L (ref 135–145)
Sodium: 140 mmol/L (ref 135–145)
Sodium: 138 mmol/L (ref 135–145)
Sodium: 138 mmol/L (ref 135–145)
Sodium: 140 mmol/L (ref 135–145)
TCO2: 22 mmol/L (ref 0–100)
TCO2: 22 mmol/L (ref 0–100)
TCO2: 20 mmol/L (ref 0–100)
TCO2: 21 mmol/L (ref 0–100)
TCO2: 21 mmol/L (ref 0–100)
TCO2: 19 mmol/L (ref 0–100)

## 2014-06-30 LAB — POCT I-STAT 3, ART BLOOD GAS (G3+)
ACID-BASE DEFICIT: 6 mmol/L — AB (ref 0.0–2.0)
Acid-base deficit: 2 mmol/L (ref 0.0–2.0)
Acid-base deficit: 3 mmol/L — ABNORMAL HIGH (ref 0.0–2.0)
Acid-base deficit: 3 mmol/L — ABNORMAL HIGH (ref 0.0–2.0)
BICARBONATE: 22.4 meq/L (ref 20.0–24.0)
BICARBONATE: 21.3 meq/L (ref 20.0–24.0)
BICARBONATE: 19.2 meq/L — AB (ref 20.0–24.0)
Bicarbonate: 19.5 mEq/L — ABNORMAL LOW (ref 20.0–24.0)
O2 SAT: 98 %
O2 SAT: 100 %
O2 Saturation: 100 %
O2 Saturation: 93 %
PCO2 ART: 32.3 mmHg — AB (ref 35.0–45.0)
PH ART: 7.429 (ref 7.350–7.450)
PO2 ART: 97 mmHg (ref 80.0–100.0)
PO2 ART: 75 mmHg — AB (ref 80.0–100.0)
Patient temperature: 36.1
Patient temperature: 37.2
TCO2: 23 mmol/L (ref 0–100)
TCO2: 22 mmol/L (ref 0–100)
TCO2: 20 mmol/L (ref 0–100)
TCO2: 21 mmol/L (ref 0–100)
pCO2 arterial: 33.8 mmHg — ABNORMAL LOW (ref 35.0–45.0)
pCO2 arterial: 24.2 mmHg — ABNORMAL LOW (ref 35.0–45.0)
pCO2 arterial: 39.8 mmHg (ref 35.0–45.0)
pH, Arterial: 7.423 (ref 7.350–7.450)
pH, Arterial: 7.506 — ABNORMAL HIGH (ref 7.350–7.450)
pH, Arterial: 7.299 — ABNORMAL LOW (ref 7.350–7.450)
pO2, Arterial: 374 mmHg — ABNORMAL HIGH (ref 80.0–100.0)
pO2, Arterial: 149 mmHg — ABNORMAL HIGH (ref 80.0–100.0)

## 2014-06-30 LAB — CBC
HCT: 31.2 % — ABNORMAL LOW (ref 39.0–52.0)
HEMATOCRIT: 30.4 % — AB (ref 39.0–52.0)
Hemoglobin: 11 g/dL — ABNORMAL LOW (ref 13.0–17.0)
Hemoglobin: 10.9 g/dL — ABNORMAL LOW (ref 13.0–17.0)
MCH: 35.6 pg — AB (ref 26.0–34.0)
MCH: 36.8 pg — ABNORMAL HIGH (ref 26.0–34.0)
MCHC: 35.3 g/dL (ref 30.0–36.0)
MCHC: 35.9 g/dL (ref 30.0–36.0)
MCV: 101 fL — ABNORMAL HIGH (ref 78.0–100.0)
MCV: 102.7 fL — ABNORMAL HIGH (ref 78.0–100.0)
PLATELETS: 114 10*3/uL — AB (ref 150–400)
PLATELETS: 155 10*3/uL (ref 150–400)
RBC: 3.09 MIL/uL — ABNORMAL LOW (ref 4.22–5.81)
RBC: 2.96 MIL/uL — ABNORMAL LOW (ref 4.22–5.81)
RDW: 12.3 % (ref 11.5–15.5)
RDW: 12.4 % (ref 11.5–15.5)
WBC: 7.4 10*3/uL (ref 4.0–10.5)
WBC: 8.4 10*3/uL (ref 4.0–10.5)

## 2014-06-30 LAB — GLUCOSE, CAPILLARY
GLUCOSE-CAPILLARY: 110 mg/dL — AB (ref 70–99)
Glucose-Capillary: 99 mg/dL (ref 70–99)
Glucose-Capillary: 143 mg/dL — ABNORMAL HIGH (ref 70–99)

## 2014-06-30 LAB — CREATININE, SERUM
CREATININE: 1.12 mg/dL (ref 0.50–1.35)
GFR calc Af Amer: 72 mL/min — ABNORMAL LOW (ref >90)
GFR, EST NON AFRICAN AMERICAN: 62 mL/min — AB (ref >90)

## 2014-06-30 LAB — HEMOGLOBIN A1C
HEMOGLOBIN A1C: 5.1 % (ref 4.8–5.6)
MEAN PLASMA GLUCOSE: 100 mg/dL

## 2014-06-30 LAB — APTT: aPTT: 39 seconds — ABNORMAL HIGH (ref 24–37)

## 2014-06-30 LAB — MAGNESIUM: Magnesium: 3.1 mg/dL — ABNORMAL HIGH (ref 1.5–2.5)

## 2014-06-30 LAB — PROTIME-INR
INR: 1.5 — ABNORMAL HIGH (ref 0.00–1.49)
Prothrombin Time: 18.2 seconds — ABNORMAL HIGH (ref 11.6–15.2)

## 2014-06-30 LAB — HEMOGLOBIN AND HEMATOCRIT, BLOOD
HCT: 28.2 % — ABNORMAL LOW (ref 39.0–52.0)
HEMOGLOBIN: 9.9 g/dL — AB (ref 13.0–17.0)

## 2014-06-30 LAB — POCT I-STAT 4, (NA,K, GLUC, HGB,HCT)
Glucose, Bld: 111 mg/dL — ABNORMAL HIGH (ref 70–99)
HCT: 31 % — ABNORMAL LOW (ref 39.0–52.0)
Hemoglobin: 10.5 g/dL — ABNORMAL LOW (ref 13.0–17.0)
Potassium: 4 mmol/L (ref 3.5–5.1)
Sodium: 142 mmol/L (ref 135–145)

## 2014-06-30 LAB — PLATELET COUNT: PLATELETS: 156 10*3/uL (ref 150–400)

## 2014-06-30 SURGERY — REPLACEMENT, AORTIC VALVE, OPEN
Anesthesia: General | Site: Chest

## 2014-06-30 MED ORDER — SODIUM CHLORIDE 0.9 % IV SOLN: INTRAVENOUS | Status: DC

## 2014-06-30 MED ORDER — PROPOFOL 10 MG/ML IV BOLUS
INTRAVENOUS | Status: AC
  Filled 2014-06-30: qty 20

## 2014-06-30 MED ORDER — ACETAMINOPHEN 500 MG PO TABS
1000.0000 mg | ORAL_TABLET | Freq: Four times a day (QID) | ORAL | Status: DC
Start: 2014-07-01 — End: 2014-07-05
  Administered 2014-06-30 – 2014-07-05 (×17): 1000 mg via ORAL
  Filled 2014-06-30 (×21): qty 2

## 2014-06-30 MED ORDER — HEPARIN SODIUM (PORCINE) 1000 UNIT/ML IJ SOLN
INTRAMUSCULAR | Status: AC
  Filled 2014-06-30: qty 3

## 2014-06-30 MED ORDER — OXYCODONE HCL 5 MG PO TABS
5.0000 mg | ORAL_TABLET | ORAL | Status: DC | PRN
  Administered 2014-07-01 – 2014-07-05 (×17): 10 mg via ORAL
  Administered 2014-07-05 (×3): 5 mg via ORAL
  Filled 2014-06-30 (×2): qty 2
  Filled 2014-06-30: qty 1
  Filled 2014-06-30 (×17): qty 2

## 2014-06-30 MED ORDER — PHENYLEPHRINE HCL 10 MG/ML IJ SOLN
0.0000 ug/min | INTRAVENOUS | Status: DC
  Administered 2014-07-01: 35 ug/min via INTRAVENOUS
  Filled 2014-06-30 (×2): qty 2

## 2014-06-30 MED ORDER — FENTANYL CITRATE (PF) 250 MCG/5ML IJ SOLN
INTRAMUSCULAR | Status: AC
  Filled 2014-06-30: qty 5

## 2014-06-30 MED ORDER — MORPHINE SULFATE 2 MG/ML IJ SOLN
1.0000 mg | INTRAMUSCULAR | Status: AC | PRN
  Administered 2014-06-30: 2 mg via INTRAVENOUS
  Administered 2014-06-30: 4 mg via INTRAVENOUS
  Administered 2014-06-30: 2 mg via INTRAVENOUS
  Filled 2014-06-30: qty 1

## 2014-06-30 MED ORDER — BISACODYL 5 MG PO TBEC
10.0000 mg | DELAYED_RELEASE_TABLET | Freq: Every day | ORAL | Status: DC
  Administered 2014-07-01 – 2014-07-04 (×4): 10 mg via ORAL
  Filled 2014-06-30 (×5): qty 2

## 2014-06-30 MED ORDER — EPHEDRINE SULFATE 50 MG/ML IJ SOLN
INTRAMUSCULAR | Status: AC
  Filled 2014-06-30: qty 1

## 2014-06-30 MED ORDER — INSULIN ASPART 100 UNIT/ML ~~LOC~~ SOLN
0.0000 [IU] | SUBCUTANEOUS | Status: DC
Start: 2014-06-30 — End: 2014-07-02
  Administered 2014-06-30 – 2014-07-01 (×3): 2 [IU] via SUBCUTANEOUS

## 2014-06-30 MED ORDER — PANTOPRAZOLE SODIUM 40 MG PO TBEC
40.0000 mg | DELAYED_RELEASE_TABLET | Freq: Every day | ORAL | Status: DC
  Administered 2014-07-02 – 2014-07-05 (×4): 40 mg via ORAL
  Filled 2014-06-30 (×4): qty 1

## 2014-06-30 MED ORDER — FENTANYL CITRATE (PF) 250 MCG/5ML IJ SOLN
INTRAMUSCULAR | Status: DC | PRN
  Administered 2014-06-30: 100 ug via INTRAVENOUS
  Administered 2014-06-30 (×3): 50 ug via INTRAVENOUS
  Administered 2014-06-30: 250 ug via INTRAVENOUS
  Administered 2014-06-30 (×2): 50 ug via INTRAVENOUS
  Administered 2014-06-30: 250 ug via INTRAVENOUS
  Administered 2014-06-30: 100 ug via INTRAVENOUS
  Administered 2014-06-30: 50 ug via INTRAVENOUS
  Administered 2014-06-30: 100 ug via INTRAVENOUS
  Administered 2014-06-30: 150 ug via INTRAVENOUS
  Administered 2014-06-30: 250 ug via INTRAVENOUS

## 2014-06-30 MED ORDER — SODIUM CHLORIDE 0.45 % IV SOLN
INTRAVENOUS | Status: DC | PRN
  Administered 2014-06-30: 13:00:00 via INTRAVENOUS

## 2014-06-30 MED ORDER — ACETAMINOPHEN 160 MG/5ML PO SOLN
1000.0000 mg | Freq: Four times a day (QID) | ORAL | Status: DC
  Filled 2014-06-30: qty 40

## 2014-06-30 MED ORDER — INSULIN REGULAR BOLUS VIA INFUSION
0.0000 [IU] | Freq: Three times a day (TID) | INTRAVENOUS | Status: DC
  Filled 2014-06-30: qty 10

## 2014-06-30 MED ORDER — ACETAMINOPHEN 650 MG RE SUPP
650.0000 mg | Freq: Once | RECTAL | Status: AC
  Administered 2014-06-30: 650 mg via RECTAL

## 2014-06-30 MED ORDER — ALBUMIN HUMAN 5 % IV SOLN
250.0000 mL | INTRAVENOUS | Status: AC | PRN
  Administered 2014-06-30 – 2014-07-01 (×4): 250 mL via INTRAVENOUS
  Filled 2014-06-30: qty 250

## 2014-06-30 MED ORDER — LACTATED RINGERS IV SOLN: INTRAVENOUS | Status: DC

## 2014-06-30 MED ORDER — PROTAMINE SULFATE 10 MG/ML IV SOLN
INTRAVENOUS | Status: AC
  Filled 2014-06-30: qty 50

## 2014-06-30 MED ORDER — SODIUM CHLORIDE 0.9 % IV SOLN
10.0000 g | INTRAVENOUS | Status: DC | PRN
  Administered 2014-06-30: 5 g/h via INTRAVENOUS

## 2014-06-30 MED ORDER — ROCURONIUM BROMIDE 100 MG/10ML IV SOLN
INTRAVENOUS | Status: DC | PRN
  Administered 2014-06-30 (×2): 50 mg via INTRAVENOUS

## 2014-06-30 MED ORDER — SODIUM CHLORIDE 0.9 % IV SOLN
INTRAVENOUS | Status: DC
  Filled 2014-06-30: qty 40

## 2014-06-30 MED ORDER — DEXTROSE 5 % IV SOLN
1.5000 g | Freq: Two times a day (BID) | INTRAVENOUS | Status: AC
  Administered 2014-06-30 – 2014-07-02 (×4): 1.5 g via INTRAVENOUS
  Filled 2014-06-30 (×4): qty 1.5

## 2014-06-30 MED ORDER — LIDOCAINE HCL (CARDIAC) 20 MG/ML IV SOLN
INTRAVENOUS | Status: AC
  Filled 2014-06-30: qty 5

## 2014-06-30 MED ORDER — VANCOMYCIN HCL IN DEXTROSE 1-5 GM/200ML-% IV SOLN
1000.0000 mg | Freq: Once | INTRAVENOUS | Status: AC
  Administered 2014-06-30: 1000 mg via INTRAVENOUS
  Filled 2014-06-30: qty 200

## 2014-06-30 MED ORDER — ACETAMINOPHEN 160 MG/5ML PO SOLN: 650.0000 mg | Freq: Once | ORAL | Status: AC

## 2014-06-30 MED ORDER — SUCCINYLCHOLINE CHLORIDE 20 MG/ML IJ SOLN
INTRAMUSCULAR | Status: AC
  Filled 2014-06-30: qty 1

## 2014-06-30 MED ORDER — LACTATED RINGERS IV SOLN
INTRAVENOUS | Status: DC | PRN
  Administered 2014-06-30 (×3): via INTRAVENOUS

## 2014-06-30 MED ORDER — LACTATED RINGERS IV SOLN
INTRAVENOUS | Status: DC | PRN
  Administered 2014-06-30 (×2): via INTRAVENOUS

## 2014-06-30 MED ORDER — PHENYLEPHRINE 40 MCG/ML (10ML) SYRINGE FOR IV PUSH (FOR BLOOD PRESSURE SUPPORT)
PREFILLED_SYRINGE | INTRAVENOUS | Status: AC
  Filled 2014-06-30: qty 10

## 2014-06-30 MED ORDER — METOPROLOL TARTRATE 25 MG/10 ML ORAL SUSPENSION
12.5000 mg | Freq: Two times a day (BID) | ORAL | Status: DC
Start: 2014-06-30 — End: 2014-07-01
  Filled 2014-06-30 (×3): qty 5

## 2014-06-30 MED ORDER — INSULIN REGULAR HUMAN 100 UNIT/ML IJ SOLN
250.0000 [IU] | INTRAMUSCULAR | Status: DC | PRN
  Administered 2014-06-30: 1 [IU]/h via INTRAVENOUS

## 2014-06-30 MED ORDER — HEPARIN SODIUM (PORCINE) 1000 UNIT/ML IJ SOLN
INTRAMUSCULAR | Status: AC
  Filled 2014-06-30: qty 1

## 2014-06-30 MED ORDER — LACTATED RINGERS IV SOLN
INTRAVENOUS | Status: DC
  Administered 2014-06-30: 18:00:00 via INTRAVENOUS

## 2014-06-30 MED ORDER — HEPARIN SODIUM (PORCINE) 1000 UNIT/ML IJ SOLN
INTRAMUSCULAR | Status: DC | PRN
  Administered 2014-06-30: 27000 [IU] via INTRAVENOUS

## 2014-06-30 MED ORDER — POTASSIUM CHLORIDE 10 MEQ/50ML IV SOLN: 10.0000 meq | INTRAVENOUS | Status: AC

## 2014-06-30 MED ORDER — ROCURONIUM BROMIDE 50 MG/5ML IV SOLN
INTRAVENOUS | Status: AC
  Filled 2014-06-30: qty 4

## 2014-06-30 MED ORDER — DEXTROSE 5 % IV SOLN
10.0000 mg | INTRAVENOUS | Status: DC | PRN
  Administered 2014-06-30: 10 ug/min via INTRAVENOUS

## 2014-06-30 MED ORDER — SODIUM CHLORIDE 0.9 % IJ SOLN
3.0000 mL | INTRAMUSCULAR | Status: DC | PRN
  Administered 2014-07-02: 3 mL via INTRAVENOUS
  Filled 2014-06-30: qty 3

## 2014-06-30 MED ORDER — PROTAMINE SULFATE 10 MG/ML IV SOLN
INTRAVENOUS | Status: DC | PRN
  Administered 2014-06-30: 240 mg via INTRAVENOUS
  Administered 2014-06-30: 10 mg via INTRAVENOUS

## 2014-06-30 MED ORDER — LACTATED RINGERS IV SOLN
INTRAVENOUS | Status: DC | PRN
  Administered 2014-06-30: 07:00:00 via INTRAVENOUS

## 2014-06-30 MED ORDER — PHENYLEPHRINE HCL 10 MG/ML IJ SOLN
INTRAMUSCULAR | Status: AC
  Filled 2014-06-30: qty 1

## 2014-06-30 MED ORDER — MIDAZOLAM HCL 10 MG/2ML IJ SOLN
INTRAMUSCULAR | Status: AC
  Filled 2014-06-30: qty 2

## 2014-06-30 MED ORDER — MIDAZOLAM HCL 2 MG/2ML IJ SOLN: 2.0000 mg | INTRAMUSCULAR | Status: DC | PRN

## 2014-06-30 MED ORDER — ARTIFICIAL TEARS OP OINT
TOPICAL_OINTMENT | OPHTHALMIC | Status: AC
  Filled 2014-06-30: qty 3.5

## 2014-06-30 MED ORDER — PROPOFOL 10 MG/ML IV BOLUS
INTRAVENOUS | Status: DC | PRN
  Administered 2014-06-30: 40 mg via INTRAVENOUS

## 2014-06-30 MED ORDER — METOPROLOL TARTRATE 1 MG/ML IV SOLN: 2.5000 mg | INTRAVENOUS | Status: DC | PRN

## 2014-06-30 MED ORDER — TRAMADOL HCL 50 MG PO TABS
50.0000 mg | ORAL_TABLET | ORAL | Status: DC | PRN
  Administered 2014-07-02: 100 mg via ORAL
  Filled 2014-06-30: qty 2

## 2014-06-30 MED ORDER — METOPROLOL TARTRATE 12.5 MG HALF TABLET
12.5000 mg | ORAL_TABLET | Freq: Two times a day (BID) | ORAL | Status: DC
  Filled 2014-06-30 (×3): qty 1

## 2014-06-30 MED ORDER — ASPIRIN EC 325 MG PO TBEC
325.0000 mg | DELAYED_RELEASE_TABLET | Freq: Every day | ORAL | Status: DC
  Administered 2014-07-01 – 2014-07-03 (×3): 325 mg via ORAL
  Filled 2014-06-30 (×4): qty 1

## 2014-06-30 MED ORDER — SODIUM CHLORIDE 0.9 % IJ SOLN
3.0000 mL | Freq: Two times a day (BID) | INTRAMUSCULAR | Status: DC
  Administered 2014-07-01 – 2014-07-02 (×2): 3 mL via INTRAVENOUS

## 2014-06-30 MED ORDER — FAMOTIDINE IN NACL 20-0.9 MG/50ML-% IV SOLN
20.0000 mg | Freq: Two times a day (BID) | INTRAVENOUS | Status: DC
  Administered 2014-06-30: 20 mg via INTRAVENOUS

## 2014-06-30 MED ORDER — BISACODYL 10 MG RE SUPP
10.0000 mg | Freq: Every day | RECTAL | Status: DC
  Filled 2014-06-30: qty 1

## 2014-06-30 MED ORDER — MIDAZOLAM HCL 5 MG/5ML IJ SOLN
INTRAMUSCULAR | Status: DC | PRN
  Administered 2014-06-30 (×5): 2 mg via INTRAVENOUS

## 2014-06-30 MED ORDER — SODIUM CHLORIDE 0.9 % IR SOLN
Status: DC | PRN
  Administered 2014-06-30: 1000 mL

## 2014-06-30 MED ORDER — SODIUM CHLORIDE 0.9 % IV SOLN: 250.0000 mL | INTRAVENOUS | Status: DC

## 2014-06-30 MED ORDER — CHLORHEXIDINE GLUCONATE 0.12 % MT SOLN
15.0000 mL | Freq: Two times a day (BID) | OROMUCOSAL | Status: DC
  Administered 2014-06-30: 15 mL via OROMUCOSAL
  Filled 2014-06-30: qty 15

## 2014-06-30 MED ORDER — ASPIRIN 81 MG PO CHEW: 324.0000 mg | CHEWABLE_TABLET | Freq: Every day | ORAL | Status: DC

## 2014-06-30 MED ORDER — ONDANSETRON HCL 4 MG/2ML IJ SOLN
INTRAMUSCULAR | Status: AC
  Filled 2014-06-30: qty 2

## 2014-06-30 MED ORDER — ONDANSETRON HCL 4 MG/2ML IJ SOLN
4.0000 mg | Freq: Four times a day (QID) | INTRAMUSCULAR | Status: DC | PRN
  Administered 2014-07-02: 4 mg via INTRAVENOUS
  Filled 2014-06-30 (×2): qty 2

## 2014-06-30 MED ORDER — ALBUMIN HUMAN 5 % IV SOLN
INTRAVENOUS | Status: DC | PRN
  Administered 2014-06-30 (×2): via INTRAVENOUS

## 2014-06-30 MED ORDER — ROCURONIUM BROMIDE 50 MG/5ML IV SOLN
INTRAVENOUS | Status: AC
  Filled 2014-06-30: qty 2

## 2014-06-30 MED ORDER — ROCURONIUM BROMIDE 50 MG/5ML IV SOLN
INTRAVENOUS | Status: AC
  Filled 2014-06-30: qty 1

## 2014-06-30 MED ORDER — LACTATED RINGERS IV SOLN: 500.0000 mL | Freq: Once | INTRAVENOUS | Status: AC | PRN

## 2014-06-30 MED ORDER — MAGNESIUM SULFATE 4 GM/100ML IV SOLN
4.0000 g | Freq: Once | INTRAVENOUS | Status: AC
  Administered 2014-06-30: 4 g via INTRAVENOUS
  Filled 2014-06-30: qty 100

## 2014-06-30 MED ORDER — DEXMEDETOMIDINE HCL IN NACL 400 MCG/100ML IV SOLN
INTRAVENOUS | Status: DC | PRN
  Administered 2014-06-30: 0.2 ug/kg/h via INTRAVENOUS

## 2014-06-30 MED ORDER — DEXMEDETOMIDINE HCL IN NACL 200 MCG/50ML IV SOLN
0.0000 ug/kg/h | INTRAVENOUS | Status: DC
  Administered 2014-06-30: 0.1 ug/kg/h via INTRAVENOUS
  Filled 2014-06-30: qty 50

## 2014-06-30 MED ORDER — SODIUM CHLORIDE 0.9 % IV SOLN
INTRAVENOUS | Status: AC
  Administered 2014-06-30: 13:00:00 via INTRAVENOUS

## 2014-06-30 MED ORDER — DOCUSATE SODIUM 100 MG PO CAPS
200.0000 mg | ORAL_CAPSULE | Freq: Every day | ORAL | Status: DC
  Administered 2014-07-01 – 2014-07-05 (×5): 200 mg via ORAL
  Filled 2014-06-30 (×6): qty 2

## 2014-06-30 MED ORDER — CETYLPYRIDINIUM CHLORIDE 0.05 % MT LIQD
7.0000 mL | Freq: Four times a day (QID) | OROMUCOSAL | Status: DC
  Administered 2014-06-30 – 2014-07-01 (×3): 7 mL via OROMUCOSAL

## 2014-06-30 MED ORDER — MORPHINE SULFATE 2 MG/ML IJ SOLN
2.0000 mg | INTRAMUSCULAR | Status: DC | PRN
  Administered 2014-07-01 (×4): 2 mg via INTRAVENOUS
  Filled 2014-06-30: qty 2
  Filled 2014-06-30 (×5): qty 1

## 2014-06-30 MED ORDER — HEMOSTATIC AGENTS (NO CHARGE) OPTIME
TOPICAL | Status: DC | PRN
  Administered 2014-06-30 (×2): 1 via TOPICAL
  Administered 2014-06-30: 3 via TOPICAL

## 2014-06-30 MED ORDER — NITROGLYCERIN IN D5W 200-5 MCG/ML-% IV SOLN
INTRAVENOUS | Status: DC | PRN
  Administered 2014-06-30: 5 ug/min via INTRAVENOUS

## 2014-06-30 MED ORDER — SODIUM CHLORIDE 0.9 % IJ SOLN
INTRAMUSCULAR | Status: AC
  Filled 2014-06-30: qty 20

## 2014-06-30 MED ORDER — NITROGLYCERIN IN D5W 200-5 MCG/ML-% IV SOLN: 0.0000 ug/min | INTRAVENOUS | Status: DC

## 2014-06-30 SURGICAL SUPPLY — 86 items
ADAPTER CARDIO PERF ANTE/RETRO (ADAPTER) ×2 IMPLANT
ADPR PRFSN 84XANTGRD RTRGD (ADAPTER) ×1
BLADE STERNUM SYSTEM 6 (BLADE) ×2 IMPLANT
BLADE SURG 11 STRL SS (BLADE) ×3 IMPLANT
CANISTER SUCTION 2500CC (MISCELLANEOUS) ×2 IMPLANT
CANNULA EZ GLIDE AORTIC 21FR (CANNULA) ×2 IMPLANT
CANNULA GUNDRY RCSP 15FR (MISCELLANEOUS) ×2 IMPLANT
CANNULA SOFTFLOW AORTIC 7M21FR (CANNULA) ×2 IMPLANT
CATH CPB KIT OWEN (MISCELLANEOUS) ×2 IMPLANT
CATH HEART VENT LEFT (CATHETERS) ×1 IMPLANT
CATH THORACIC 36FR RT ANG (CATHETERS) ×2 IMPLANT
COVER SURGICAL LIGHT HANDLE (MISCELLANEOUS) ×2 IMPLANT
CRADLE DONUT ADULT HEAD (MISCELLANEOUS) ×2 IMPLANT
DEVICE SUT CK QUICK LOAD INDV (Prosthesis & Implant Heart) ×2 IMPLANT
DEVICE SUT CK QUICK LOAD MINI (Prosthesis & Implant Heart) ×1 IMPLANT
DRAIN CHANNEL 32F RND 10.7 FF (WOUND CARE) ×2 IMPLANT
DRAPE BILATERAL SPLIT (DRAPES) IMPLANT
DRAPE CARDIOVASCULAR INCISE (DRAPES)
DRAPE CV SPLIT W-CLR ANES SCRN (DRAPES) IMPLANT
DRAPE INCISE IOBAN 66X45 STRL (DRAPES) ×4 IMPLANT
DRAPE SLUSH/WARMER DISC (DRAPES) ×2 IMPLANT
DRAPE SRG 135X102X78XABS (DRAPES) IMPLANT
DRSG AQUACEL AG ADV 3.5X14 (GAUZE/BANDAGES/DRESSINGS) ×1 IMPLANT
DRSG COVADERM 4X14 (GAUZE/BANDAGES/DRESSINGS) ×2 IMPLANT
ELECT REM PT RETURN 9FT ADLT (ELECTROSURGICAL) ×4
ELECTRODE REM PT RTRN 9FT ADLT (ELECTROSURGICAL) ×2 IMPLANT
GAUZE SPONGE 4X4 12PLY STRL (GAUZE/BANDAGES/DRESSINGS) ×4 IMPLANT
GLOVE BIO SURGEON STRL SZ 6 (GLOVE) IMPLANT
GLOVE BIO SURGEON STRL SZ 6.5 (GLOVE) ×2 IMPLANT
GLOVE BIO SURGEON STRL SZ7 (GLOVE) ×1 IMPLANT
GLOVE BIO SURGEON STRL SZ7.5 (GLOVE) IMPLANT
GLOVE ORTHO TXT STRL SZ7.5 (GLOVE) ×6 IMPLANT
GOWN STRL REUS W/ TWL LRG LVL3 (GOWN DISPOSABLE) ×4 IMPLANT
GOWN STRL REUS W/TWL LRG LVL3 (GOWN DISPOSABLE) ×8
HEMOSTAT POWDER SURGIFOAM 1G (HEMOSTASIS) ×8 IMPLANT
INSERT FOGARTY XLG (MISCELLANEOUS) ×2 IMPLANT
KIT BASIN OR (CUSTOM PROCEDURE TRAY) ×2 IMPLANT
KIT ROOM TURNOVER OR (KITS) ×2 IMPLANT
KIT SUCTION CATH 14FR (SUCTIONS) ×6 IMPLANT
KIT SUT CK MINI COMBO 4X17 (Prosthesis & Implant Heart) ×1 IMPLANT
LEAD PACING MYOCARDI (MISCELLANEOUS) ×2 IMPLANT
LINE VENT (MISCELLANEOUS) ×1 IMPLANT
NDL SUT 1 .5 CRC FRENCH EYE (NEEDLE) IMPLANT
NEEDLE FRENCH EYE (NEEDLE) ×2
NS IRRIG 1000ML POUR BTL (IV SOLUTION) ×10 IMPLANT
PACK OPEN HEART (CUSTOM PROCEDURE TRAY) ×2 IMPLANT
PAD ARMBOARD 7.5X6 YLW CONV (MISCELLANEOUS) ×4 IMPLANT
SET CARDIOPLEGIA MPS 5001102 (MISCELLANEOUS) ×1 IMPLANT
SET IRRIG TUBING LAPAROSCOPIC (IRRIGATION / IRRIGATOR) ×2 IMPLANT
SPONGE GAUZE 4X4 12PLY STER LF (GAUZE/BANDAGES/DRESSINGS) ×1 IMPLANT
SPONGE LAP 4X18 X RAY DECT (DISPOSABLE) ×1 IMPLANT
SUT BONE WAX W31G (SUTURE) ×2 IMPLANT
SUT ETHIBON 2 0 V 52N 30 (SUTURE) ×4 IMPLANT
SUT ETHIBON EXCEL 2-0 V-5 (SUTURE) IMPLANT
SUT ETHIBOND 2 0 SH (SUTURE)
SUT ETHIBOND 2 0 SH 36X2 (SUTURE) IMPLANT
SUT ETHIBOND 2 0 V4 (SUTURE) IMPLANT
SUT ETHIBOND 2 0V4 GREEN (SUTURE) IMPLANT
SUT ETHIBOND 4 0 RB 1 (SUTURE) IMPLANT
SUT ETHIBOND V-5 VALVE (SUTURE) IMPLANT
SUT ETHIBOND X763 2 0 SH 1 (SUTURE) ×6 IMPLANT
SUT MNCRL AB 3-0 PS2 18 (SUTURE) ×4 IMPLANT
SUT PDS AB 1 CTX 36 (SUTURE) ×4 IMPLANT
SUT PROLENE 3 0 SH DA (SUTURE) ×1 IMPLANT
SUT PROLENE 3 0 SH1 36 (SUTURE) ×8 IMPLANT
SUT PROLENE 4 0 RB 1 (SUTURE) ×12
SUT PROLENE 4 0 SH DA (SUTURE) ×2 IMPLANT
SUT PROLENE 4-0 RB1 .5 CRCL 36 (SUTURE) ×2 IMPLANT
SUT PROLENE 5 0 C 1 36 (SUTURE) IMPLANT
SUT PROLENE 6 0 C 1 30 (SUTURE) IMPLANT
SUT SILK  1 MH (SUTURE) ×1
SUT SILK 1 MH (SUTURE) ×1 IMPLANT
SUT SILK 2 0 SH CR/8 (SUTURE) IMPLANT
SUT SILK 3 0 SH CR/8 (SUTURE) IMPLANT
SUT STEEL 6MS V (SUTURE) IMPLANT
SUT VIC AB 2-0 CTX 27 (SUTURE) IMPLANT
SUTURE E-PAK OPEN HEART (SUTURE) ×2 IMPLANT
SYSTEM SAHARA CHEST DRAIN ATS (WOUND CARE) ×2 IMPLANT
TAPE CLOTH SURG 4X10 WHT LF (GAUZE/BANDAGES/DRESSINGS) ×1 IMPLANT
TOWEL OR 17X24 6PK STRL BLUE (TOWEL DISPOSABLE) ×4 IMPLANT
TOWEL OR 17X26 10 PK STRL BLUE (TOWEL DISPOSABLE) ×4 IMPLANT
TRAY FOLEY IC TEMP SENS 16FR (CATHETERS) ×2 IMPLANT
UNDERPAD 30X30 INCONTINENT (UNDERPADS AND DIAPERS) ×2 IMPLANT
VALVE MAGNA EASE AORTIC 25MM (Prosthesis & Implant Heart) ×1 IMPLANT
VENT LEFT HEART 12002 (CATHETERS) ×2
WATER STERILE IRR 1000ML POUR (IV SOLUTION) ×4 IMPLANT

## 2014-06-30 NOTE — Brief Op Note (Addendum)
06/30/2014  11:06 AM  PATIENT:  Erik [name]  79 y.o. male  PRE-OPERATIVE DIAGNOSIS:  Severe Aortic Stenosis  POST-OPERATIVE DIAGNOSIS:  Severe Aortic stenosis  PROCEDURE:   AORTIC VALVE REPLACEMENT (25 mm [facility] Ease pericardial tissue valve)  SURGEON:    Rexene Alberts, MD  ASSISTANTS:  Suzzanne Cloud, PA-C  ANESTHESIA:   Roberts Gaudy, MD  CROSSCLAMP TIME:   29'  CARDIOPULMONARY BYPASS TIME: 111'  FINDINGS:  Bicuspid native aortic valve with severe aortic stenosis  Normal LV systolic function  Moderate LV hypertrophy  Aortic Valve  Procedure Performed:  Replacement: Yes.  Bioprosthetic Valve. Implant Model Number:3300TFX, Size:25, Unique Device Identifier:[id number]  Repair/Reconstruction: No.   Aortic Annular Enlargement: No.     Aortic Valve Etiology   Aortic Insufficiency:  Trivial/Trace  Aortic Valve Disease:  Yes.  Aortic Stenosis:  Yes. Smallest Aortic Valve Area: 044cm2; Highest Mean Gradient: 19mmHg.  Etiology (Choose at least one and up to  5 etiologies):  Bicuspid valve disease and Degenerative - Calcified  COMPLICATIONS: None  BASELINE WEIGHT: 84 kg  PATIENT DISPOSITION:   TO SICU IN STABLE CONDITION  Rexene Alberts 06/30/2014 12:22 PM

## 2014-06-30 NOTE — Progress Notes (Signed)
Vent changes made per protocol

## 2014-06-30 NOTE — Progress Notes (Signed)
      [address]       [place] [address]             [phone]    S/p AVR  Just extubated  BP 110/57 mmHg  Pulse 81  Temp(Src) 98.6 F (37 C) (Core (Comment))  Resp 16  Ht 6\' 1"  (1.854 m)  Wt 187 lb (84.823 kg)  BMI 24.68 kg/m2  SpO2 100%  CI= 2.12   Intake/Output Summary (Last 24 hours) at 06/30/14 1841 Last data filed at 06/30/14 1800  Gross per 24 hour  Intake 5486.63 ml  Output   3635 ml  Net 1851.63 ml    Doing well early postop  Remo Lipps C. Roxan Hockey, MD [facility] [phone]

## 2014-06-30 NOTE — Progress Notes (Signed)
NIF -65, VC 900 with good effort.

## 2014-06-30 NOTE — Progress Notes (Signed)
  Echocardiogram Echocardiogram Transesophageal has been performed.  Erik [name] 06/30/2014, 8:33 AM

## 2014-06-30 NOTE — Anesthesia Preprocedure Evaluation (Addendum)
Anesthesia Evaluation  Patient identified by MRN, date of birth, ID band Patient awake    Reviewed: Allergy & Precautions, Patient's Chart, lab work & pertinent test results  Airway Mallampati: II  TM Distance: >3 FB Neck ROM: Full    Dental  (+) Teeth Intact, Dental Advisory Given   Pulmonary former smoker,  breath sounds clear to auscultation        Cardiovascular + Valvular Problems/Murmurs AS Rhythm:Irregular Rate:Normal + Systolic murmurs    Neuro/Psych    GI/Hepatic   Endo/Other    Renal/GU      Musculoskeletal   Abdominal   Peds  Hematology   Anesthesia Other Findings   Reproductive/Obstetrics                            Anesthesia Physical Anesthesia Plan  ASA: III  Anesthesia Plan: General   Post-op Pain Management:    Induction: Intravenous  Airway Management Planned: Oral ETT  Additional Equipment: PA Cath, CVP, 3D TEE and Arterial line  Intra-op Plan:   Post-operative Plan: Post-operative intubation/ventilation  Informed Consent: I have reviewed the patients History and Physical, chart, labs and discussed the procedure including the risks, benefits and alternatives for the proposed anesthesia with the patient or authorized representative who has indicated his/her understanding and acceptance.   Dental advisory given  Plan Discussed with: CRNA, Anesthesiologist and Surgeon  Anesthesia Plan Comments: (Severe symptomatic AS  Plan GA with oral ETT, TEE, PA cath, art line)       Anesthesia Quick Evaluation

## 2014-06-30 NOTE — Op Note (Signed)
CARDIOTHORACIC SURGERY OPERATIVE NOTE  Date of Procedure:  06/30/2014  Preoperative Diagnosis: Severe Aortic Stenosis   Postoperative Diagnosis: Same   Procedure:    Aortic Valve Replacement  [employer] Ease Pericardial Tissue Valve (size 25 mm, model # 3300TFX, serial # [id number])   Surgeon: Valentina Gu. Roxy Manns, MD  Assistant: Suzzanne Cloud, PA-C  Anesthesia: Roberts Gaudy, MD  Operative Findings:  Bicuspid native aortic valve with severe aortic stenosis  Normal LV systolic function  Moderate LV hypertrophy           BRIEF CLINICAL NOTE AND INDICATIONS FOR SURGERY  Patient is a 79 year old male who has been remarkably healthy for all of his life although many years ago he was told that he had a heart murmur on physical exam. He denies any previous knowledge of the diagnosis of aortic stenosis, although he thinks he may have had an echocardiogram done in the remote past. He has remained physically active and completely functionally independent without any significant limitations until recently. He states that over the last 6 months or so he has developed some mild exertional shortness of breath and fatigue. This would only develop with relatively strenuous exertion. He otherwise felt well until 06/19/2014 when he developed sudden onset of sharp substernal chest tightness and dizziness. At the time he had been working in his yard doing some fairly strenuous activity. He had to sit down for fear of passing out. His symptoms resolved within 5-10 minutes. He presented to the emergency department where troponin levels were minimally positive at 0.05 and BNP level was elevated to 1280. Head CT scan was normal. CT angiogram of the chest was negative for pulmonary embolus. Cardiology consultation was requested and the patient was evaluated by Dr. Georgina Peer. He was found to have a harsh, late peaking systolic murmur suspicious for severe aortic stenosis. Transthoracic echocardiogram confirmed  the presence of critical aortic stenosis with normal left ventricular systolic function. Peak velocity across the aortic valve measured in excess of 5.4 m/s corresponding to peak and mean transvalvular gradient estimated 119 and 58 mmHg, respectively. Left ventricular ejection fraction was estimated at 50-55%. Left and right heart catheterization was performed by Dr. Claiborne Billings on 06/22/2014. This confirmed the presence of severe aortic stenosis with peak to peak and mean transvalvular gradients measured 66 and 44 mmHg, respectively. Aortic valve area was calculated 0.58 cm. There was normal coronary artery anatomy with no significant coronary artery disease. Pulmonary artery pressures were normal. Cardiothoracic surgical consultation was requested. The patient has been seen in consultation and counseled at length regarding the indications, risks and potential benefits of surgery.  All questions have been answered, and the patient provides full informed consent for the operation as described.    DETAILS OF THE OPERATIVE PROCEDURE  Preparation:  The patient is brought to the operating room on the above mentioned date and central monitoring was established by the anesthesia team including placement of Swan-Ganz catheter and radial arterial line. The patient is placed in the supine position on the operating table.  Intravenous antibiotics are administered. General endotracheal anesthesia is induced uneventfully. A Foley catheter is placed.  Baseline transesophageal echocardiogram was performed.  Findings were notable for bicuspid native aortic valve with fusion of the left and right cusps of the aortic valve.  There was critical aortic stenosis.  There was normal LV systolic function with mild to moderate LV hypertrophy.  The patient's chest, abdomen, both groins, and both lower extremities are prepared and draped in a sterile manner.  A time out procedure is performed.   Surgical Approach:  A median  sternotomy incision was performed and the pericardium is opened. The ascending aorta is mildly dilated and thin walled in appearance.  There was severe calcification along the right lateral wall of the ascending aorta, but there was normal area for cannulation and cross clamp application.   Extracorporeal Cardiopulmonary Bypass and Myocardial Protection:  The ascending aorta and the right atrium are cannulated for cardiopulmonary bypass.  Adequate heparinization is verified.   A retrograde cardioplegia cannula is placed through the right atrium into the coronary sinus.  The operative field was continuously flooded with carbon dioxide gas.  The entire pre-bypass portion of the operation was notable for stable hemodynamics.  Cardiopulmonary bypass was begun and the surface of the heart is inspected.  A cardioplegia cannula is placed in the ascending aorta.  A temperature probe was placed in the interventricular septum.  The patient is cooled to 32C systemic temperature.  The aortic cross clamp is applied and cold blood cardioplegia is delivered initially in an antegrade fashion through the aortic root.   Supplemental cardioplegia is given retrograde through the coronary sinus catheter.  Iced saline slush is applied for topical hypothermia.  The initial cardioplegic arrest is rapid with early diastolic arrest.  Repeat doses of cardioplegia are administered intermittently throughout the entire cross clamp portion of the operation through the coronary sinus catheter in order to maintain completely flat electrocardiogram and septal myocardial temperature below 15C.  Myocardial protection was felt to be excellent.   Aortic Valve Replacement:  An oblique transverse aortotomy incision was performed.  The aortic valve was inspected and notable for bicuspid valve with fusion of the left and right cusps and severe aortic stenosis.  The aortic valve leaflets were excised sharply and the aortic annulus  decalcified.  Decalcification was notably tedious and extensive.  The aortic annulus was sized to accept a 25 mm prosthesis.  The aortic root and left ventricle were irrigated with copious cold saline solution.  Aortic valve replacement was performed using interrupted horizontal mattress 2-0 Ethibond pledgeted sutures with pledgets in the subannular position.  An [facility] Ease pericardial tissue valve (size 25 mm, model # 3300TFX, serial # [id number]) was implanted uneventfully. The valve seated appropriately with adequate space beneath the left main and right coronary artery.   Procedure Completion:  The aortotomy was closed using a 2-layer closure of running 4-0 Prolene suture with Teflon felt strips to buttress the suture line.  The felt strips were placed circumferentially around the aorta to prevent late dilitation.  One final dose of warm retrograde "hot shot" cardioplegia was administered retrograde through the coronary sinus catheter while all air was evacuated through the aortic root.  The aortic cross clamp was removed after a total cross clamp time of 78 minutes.  Epicardial pacing wires are fixed to the right ventricular outflow tract and to the right atrial appendage. The patient is rewarmed to 37C temperature. The aortic and left ventricular vents are removed.  The patient is weaned and disconnected from cardiopulmonary bypass.  The patient's rhythm at separation from bypass was AV paced.  The patient was weaned from cardioplegic bypass without any inotropic support. Total cardiopulmonary bypass time for the operation was 111 minutes.  Followup transesophageal echocardiogram performed after separation from bypass revealed a well-seated aortic valve prosthesis that was functioning normally and without any sign of perivalvular leak.  Left ventricular function was unchanged from preoperatively.  The aortic and  venous cannula were removed uneventfully. Protamine was administered to reverse  the anticoagulation. The mediastinum and pleural space were inspected for hemostasis and irrigated with saline solution. The mediastinum was drained using 2 chest tubes placed through separate stab incisions inferiorly.  The soft tissues anterior to the aorta were reapproximated loosely. The sternum is closed with double strength sternal wire. The soft tissues anterior to the sternum were closed in multiple layers and the skin is closed with a running subcuticular skin closure.  The post-bypass portion of the operation was notable for stable rhythm and hemodynamics.  No blood products were administered during the operation.   Disposition:  The patient tolerated the procedure well and is transported to the surgical intensive care in stable condition. There are no intraoperative complications. All sponge instrument and needle counts are verified correct at completion of the operation.    Valentina Gu. Roxy Manns MD 06/30/2014 12:26 PM

## 2014-06-30 NOTE — Interval H&P Note (Signed)
History and Physical Interval Note:  06/30/2014 7:17 AM  Erik [name]  has presented today for surgery, with the diagnosis of Aortic Stenosis  The various methods of treatment have been discussed with the patient and family. After consideration of risks, benefits and other options for treatment, the patient has consented to  Procedure(s): AORTIC VALVE REPLACEMENT (AVR) (N/A) INTRAOPERATIVE TRANSESOPHAGEAL ECHOCARDIOGRAM (N/A) as a surgical intervention .  The patient's history has been reviewed, patient examined, no change in status, stable for surgery.  I have reviewed the patient's chart and labs.  Questions were answered to the patient's satisfaction.     Rexene Alberts

## 2014-06-30 NOTE — Transfer of Care (Signed)
Immediate Anesthesia Transfer of Care Note  Patient: Erik [name]  Procedure(s) Performed: Procedure(s): AORTIC VALVE REPLACEMENT (AVR) (N/A) INTRAOPERATIVE TRANSESOPHAGEAL ECHOCARDIOGRAM (N/A)  Patient Location: SICU  Anesthesia Type:General  Level of Consciousness: sedated  Airway & Oxygen Therapy: Patient remains intubated per anesthesia plan and Patient placed on Ventilator (see vital sign flow sheet for setting)  Post-op Assessment: Report given to RN and Post -op Vital signs reviewed and stable  Post vital signs: Reviewed and stable  Last Vitals:  Filed Vitals:   06/30/14 0605  BP: 120/56  Pulse: 31  Temp: 36.7 C  Resp: 18    Complications: No apparent anesthesia complications

## 2014-06-30 NOTE — Addendum Note (Signed)
Addendum  created 06/30/14 1816 by Roberts Gaudy, MD   Modules edited: Notes Section   Notes Section:  File: [id number]; Pend: [id number]; Pend: [id number]; Raelyn Number: [id number]; Raelyn Number: [id number]; Pend: [id number]

## 2014-06-30 NOTE — CV Procedure (Signed)
Intra-operative Transesoophageal Echocardiography Report:  Mr. Erik [name] is a 79 year old male who was previously in excellent health. Over the past 6 months, he developed mild exertional shortness of breath and fatigue. He had a near syncopal episode and was admitted to the hospital and found by Dr. Claiborne Billings to have critical aortic stenosis. Cardiac catheterization revealed normal coronary artery anatomy. He is now scheduled to undergo aortic valve replacement by Dr. Roxy Manns. Intra-operative transesophageal echocardiography was indicated to evaluate the aortic valve, to assist with the procedure, to assess for any other valvular pathology, and to serve as a monitor for intraoperative volume status and cardiac function.  The patient was brought to the operating room at [facility] and general anesthesia was induced without difficulty. Following endotracheal intubation and orogastric suctioning, the transesophageal echocardiography probe was inserted into the esophagus without difficulty.  Impression: Pre-bypass findings:  1. Aortic valve: There was severe aortic insufficiency. There was a bicuspid aortic valve with fusion of the right and left coronary cusps. There was severe calcification of the leaflets and severe restriction to opening. There was trivial aortic insufficiency. The aortic annulus measured 24 mm in diameter. Continuous-wave Doppler interrogation of the aortic outflow revealed a peak velocity of 5.13 m/s. Peak instantaneous gradient of 105 mmHg, mean gradient of 65 mmHg. The aortic valve area using the VTI method was 0.51 cm and 0.48 cm using the V-max method. The dimensionless ratio of the VTI of the LVOT to the aortic valve was 0.11.  2. Mitral valve: There was trace mitral insufficiency. There was mild mitral annular calcification. The leaflets coapted normally and there were no prolapsing or flail segments noted.  3. Left ventricle: The left ventricular cavity was within normal  limits of size and measured 4.56 cm at end diastole at the mid-papillary level in the transgastric short axis view. The LV wall thickness  was normal. The anterior wall measured 1.06 cm and the posterior wall measured 1.01 cm at end diastole at the mid-papillary level. There was mild global left ventricular hypokinesis and ejection fraction was estimated at 50%.  4. Right ventricle: The right ventricular cavity was normal in size. There was normal contractility of the right ventricular free wall and there was normal appearing right ventricle function.  5. Tricuspid valve: The tricuspid leaflets appeared normal and in structure and there was trace tricuspid insufficiency.  6. Interatrial septum: The interatrial septum was intact without evidence of patent foramen ovale or atrial septal defect by color Doppler and bubble study.  7. Left atrium: Left atrial cavity showed no evidence of thrombus. However there was smoke or spontaneous echo contrast within the left atrial appendage but on careful examination no thrombus could be appreciated.  8. Ascending aorta: The aortic root and proximal ascending aorta were not dilated and there was a well-defined aortic root aortic root and sinotubular ridge. However there was moderate to severe calcification noted within the walls of the ascending aorta and aortic root. There were no protruding atheromata noted. The aortic root at the sinuses of Valsalva measured 3.3 cm and the proximal ascending aorta measured 3.0 cm.  10. Descending aorta: There was scattered grade 1-2 atheromatous disease. The diameter of the ascending aorta was 2.9 cm.  Post-bypass Findings:  1. Aortic valve: There was a bioprosthetic valve in aortic position. The leaflets opened normally. There was no aortic insufficiency. The mean gradient across the prosthetic valve was 8 mmHg.  2. Mitral valve: The mitral valve appeared unchanged from the pre-bypass study.  There was trace mitral  insufficiency.  8. Left ventricle: There was some improvement in LV contractility  and the ejection fraction was estimated 50-55%. There were no regional wall motion abnormalities.  4. Right ventricle: The right ventricular function appeared normal. There was good contractility of the right ventricular free wall and there was normal right ventricular size.  5. Tricuspid valve: The tricuspid valve showed trace tricuspid insufficiency and the leaflets were normal-appearing.  Roberts Gaudy, M.D.

## 2014-06-30 NOTE — Procedures (Signed)
Extubation Procedure Note  Patient Details:   Name: Erik [name] DOB: 08/23/1935 MRN: [id number]   Airway Documentation:     Evaluation  O2 sats: 99  Complications: No apparent complications Patient did tolerate procedure well. Bilateral Breath Sounds: Clear, Diminished   Yes.  Deep oral sxn done.  Pt is awake and can follow commands.  Pt has positive cuff leak.  Per Dr Roxy Manns extubated pt to 3L [place].  Pt tol well. No stridor  Constance Holster 06/30/2014, 6:42 PM

## 2014-06-30 NOTE — Progress Notes (Signed)
UR Completed.  [phone]  

## 2014-06-30 NOTE — Anesthesia Postprocedure Evaluation (Signed)
  Anesthesia Post-op Note  Patient: Erik [name]  Procedure(s) Performed: Procedure(s): AORTIC VALVE REPLACEMENT (AVR) (N/A) INTRAOPERATIVE TRANSESOPHAGEAL ECHOCARDIOGRAM (N/A)  Patient Location: PACU  Anesthesia Type:General  Level of Consciousness: sedated, responds to stimulation and Patient remains intubated per anesthesia plan  Airway and Oxygen Therapy: Patient remains intubated per anesthesia plan and Patient placed on Ventilator (see vital sign flow sheet for setting)  Post-op Pain: none  Post-op Assessment: Post-op Vital signs reviewed, Patient's Cardiovascular Status Stable, Respiratory Function Stable and Patent Airway  Post-op Vital Signs: stable  Last Vitals:  Filed Vitals:   06/30/14 1643  BP: 118/59  Pulse: 80  Temp: 36.4 C  Resp:     Complications: No apparent anesthesia complications

## 2014-06-30 NOTE — Anesthesia Procedure Notes (Signed)
Procedure Name: Intubation Date/Time: 06/30/2014 8:16 AM Performed by: Maude Leriche D Pre-anesthesia Checklist: Patient identified, Suction available, Emergency Drugs available, Patient being monitored and Timeout performed Patient Re-evaluated:Patient Re-evaluated prior to inductionOxygen Delivery Method: Circle system utilized Preoxygenation: Pre-oxygenation with 100% oxygen Intubation Type: IV induction Ventilation: Mask ventilation without difficulty and Oral airway inserted - appropriate to patient size Laryngoscope Size: Miller and 2 Grade View: Grade I Tube type: Subglottic suction tube Tube size: 7.5 mm Number of attempts: 1 Airway Equipment and Method: Stylet Placement Confirmation: ETT inserted through vocal cords under direct vision,  positive ETCO2 and breath sounds checked- equal and bilateral Secured at: 23 cm Tube secured with: Tape Dental Injury: Teeth and Oropharynx as per pre-operative assessment

## 2014-07-01 ENCOUNTER — Inpatient Hospital Stay (HOSPITAL_COMMUNITY): Payer: Medicare Other

## 2014-07-01 LAB — BASIC METABOLIC PANEL
Anion gap: 6 (ref 5–15)
BUN: 8 mg/dL (ref 6–23)
CO2: 23 mmol/L (ref 19–32)
CREATININE: 1.09 mg/dL (ref 0.50–1.35)
Calcium: 8 mg/dL — ABNORMAL LOW (ref 8.4–10.5)
Chloride: 108 mmol/L (ref 96–112)
GFR calc Af Amer: 74 mL/min — ABNORMAL LOW (ref >90)
GFR calc non Af Amer: 64 mL/min — ABNORMAL LOW (ref >90)
Glucose, Bld: 141 mg/dL — ABNORMAL HIGH (ref 70–99)
POTASSIUM: 4.5 mmol/L (ref 3.5–5.1)
SODIUM: 137 mmol/L (ref 135–145)

## 2014-07-01 LAB — CBC
HCT: 30 % — ABNORMAL LOW (ref 39.0–52.0)
HEMATOCRIT: 30.2 % — AB (ref 39.0–52.0)
HEMOGLOBIN: 10.3 g/dL — AB (ref 13.0–17.0)
Hemoglobin: 10.2 g/dL — ABNORMAL LOW (ref 13.0–17.0)
MCH: 34.3 pg — ABNORMAL HIGH (ref 26.0–34.0)
MCH: 35 pg — AB (ref 26.0–34.0)
MCHC: 33.8 g/dL (ref 30.0–36.0)
MCHC: 34.3 g/dL (ref 30.0–36.0)
MCV: 101.7 fL — ABNORMAL HIGH (ref 78.0–100.0)
MCV: 102 fL — AB (ref 78.0–100.0)
PLATELETS: 174 10*3/uL (ref 150–400)
Platelets: 174 10*3/uL (ref 150–400)
RBC: 2.97 MIL/uL — AB (ref 4.22–5.81)
RBC: 2.94 MIL/uL — AB (ref 4.22–5.81)
RDW: 12.5 % (ref 11.5–15.5)
RDW: 12.6 % (ref 11.5–15.5)
WBC: 11.7 10*3/uL — ABNORMAL HIGH (ref 4.0–10.5)
WBC: 12 10*3/uL — ABNORMAL HIGH (ref 4.0–10.5)

## 2014-07-01 LAB — POCT I-STAT, CHEM 8
BUN: 10 mg/dL (ref 6–23)
CHLORIDE: 103 mmol/L (ref 96–112)
Calcium, Ion: 1.15 mmol/L (ref 1.13–1.30)
Creatinine, Ser: 1.1 mg/dL (ref 0.50–1.35)
Glucose, Bld: 144 mg/dL — ABNORMAL HIGH (ref 70–99)
HCT: 28 % — ABNORMAL LOW (ref 39.0–52.0)
HEMOGLOBIN: 9.5 g/dL — AB (ref 13.0–17.0)
Potassium: 4 mmol/L (ref 3.5–5.1)
Sodium: 137 mmol/L (ref 135–145)
TCO2: 19 mmol/L (ref 0–100)

## 2014-07-01 LAB — GLUCOSE, CAPILLARY
GLUCOSE-CAPILLARY: 138 mg/dL — AB (ref 70–99)
GLUCOSE-CAPILLARY: 139 mg/dL — AB (ref 70–99)
Glucose-Capillary: 144 mg/dL — ABNORMAL HIGH (ref 70–99)
Glucose-Capillary: 110 mg/dL — ABNORMAL HIGH (ref 70–99)
Glucose-Capillary: 134 mg/dL — ABNORMAL HIGH (ref 70–99)
Glucose-Capillary: 137 mg/dL — ABNORMAL HIGH (ref 70–99)

## 2014-07-01 LAB — POCT I-STAT 3, ART BLOOD GAS (G3+)
Acid-base deficit: 4 mmol/L — ABNORMAL HIGH (ref 0.0–2.0)
Bicarbonate: 22 mEq/L (ref 20.0–24.0)
O2 SAT: 98 %
Patient temperature: 37.1
TCO2: 23 mmol/L (ref 0–100)
pCO2 arterial: 41.9 mmHg (ref 35.0–45.0)
pH, Arterial: 7.328 — ABNORMAL LOW (ref 7.350–7.450)
pO2, Arterial: 111 mmHg — ABNORMAL HIGH (ref 80.0–100.0)

## 2014-07-01 LAB — MAGNESIUM
MAGNESIUM: 2.6 mg/dL — AB (ref 1.5–2.5)
Magnesium: 2.7 mg/dL — ABNORMAL HIGH (ref 1.5–2.5)

## 2014-07-01 LAB — CREATININE, SERUM
CREATININE: 1.1 mg/dL (ref 0.50–1.35)
GFR calc Af Amer: 73 mL/min — ABNORMAL LOW (ref >90)
GFR calc non Af Amer: 63 mL/min — ABNORMAL LOW (ref >90)

## 2014-07-01 MED ORDER — INSULIN ASPART 100 UNIT/ML ~~LOC~~ SOLN
0.0000 [IU] | SUBCUTANEOUS | Status: DC
  Administered 2014-07-01: 2 [IU] via SUBCUTANEOUS

## 2014-07-01 MED ORDER — FUROSEMIDE 10 MG/ML IJ SOLN
20.0000 mg | Freq: Two times a day (BID) | INTRAMUSCULAR | Status: AC
  Administered 2014-07-01 (×2): 20 mg via INTRAVENOUS
  Filled 2014-07-01 (×2): qty 2

## 2014-07-01 MED ORDER — CETYLPYRIDINIUM CHLORIDE 0.05 % MT LIQD
7.0000 mL | Freq: Two times a day (BID) | OROMUCOSAL | Status: DC
  Administered 2014-07-01 – 2014-07-02 (×3): 7 mL via OROMUCOSAL

## 2014-07-01 MED ORDER — ENOXAPARIN SODIUM 40 MG/0.4ML ~~LOC~~ SOLN
40.0000 mg | Freq: Every day | SUBCUTANEOUS | Status: DC
  Filled 2014-07-01 (×2): qty 0.4

## 2014-07-01 NOTE — Progress Notes (Signed)
      [address]       [place] [address]             [phone]      Resting quietly  BP 104/58 mmHg  Pulse 90  Temp(Src) 98.3 F (36.8 C) (Oral)  Resp 14  Ht 6\' 1"  (1.854 m)  Wt 199 lb 9.6 oz (90.538 kg)  BMI 26.34 kg/m2  SpO2 96%   Intake/Output Summary (Last 24 hours) at 07/01/14 1909 Last data filed at 07/01/14 1900  Gross per 24 hour  Intake 2864.29 ml  Output   2400 ml  Net 464.29 ml   K= 4.0, Hct= 28  CBG OK  Doing well POD #1  Remo Lipps C. Roxan Hockey, MD [facility] [phone]

## 2014-07-01 NOTE — Progress Notes (Signed)
1 Day Post-Op Procedure(s) (LRB): AORTIC VALVE REPLACEMENT (AVR) (N/A) INTRAOPERATIVE TRANSESOPHAGEAL ECHOCARDIOGRAM (N/A) Subjective: Some incisional pain. Denies nausea  Objective: Vital signs in last 24 hours: Temp:  [95.9 F (35.5 C)-99.1 F (37.3 C)] 98.8 F (37.1 C) (04/16 0730) Pulse Rate:  [78-91] 90 (04/16 0730) Cardiac Rhythm:  [-] Atrial paced (04/16 0400) Resp:  [4-24] 15 (04/16 0730) BP: (83-141)/(49-77) 87/59 mmHg (04/16 0700) SpO2:  [95 %-100 %] 99 % (04/16 0730) Arterial Line BP: (68-139)/(31-72) 99/48 mmHg (04/16 0730) FiO2 (%):  [40 %-50 %] 40 % (04/15 1643) Weight:  [199 lb 9.6 oz (90.538 kg)] 199 lb 9.6 oz (90.538 kg) (04/16 0500)  Hemodynamic parameters for last 24 hours: PAP: (15-37)/(4-22) 30/16 mmHg CO:  [3.4 L/min-6.3 L/min] 4.1 L/min CI:  [1.6 L/min/m2-3 L/min/m2] 1.9 L/min/m2  Intake/Output from previous day: 04/15 0701 - 04/16 0700 In: 7255.5 [I.V.:5064.5; Blood:511; NG/GT:30; IV Piggyback:1650] Out: 4520 [Urine:3160; Blood:900; Chest Tube:460] Intake/Output this shift:    General appearance: alert, cooperative and no distress Neurologic: intact Heart: regular rate and rhythm and + rub Lungs: diminished breath sounds bibasilar Abdomen: normal findings: soft, non-tender  Lab Results:  Recent Labs  06/30/14 1845 06/30/14 1851 07/01/14 0500  WBC 8.4  --  11.7*  HGB 10.9* 10.5* 10.2*  HCT 30.4* 31.0* 30.2*  PLT 155  --  174   BMET:  Recent Labs  06/29/14 1437  06/30/14 1851 07/01/14 0500  NA 135  < > 140 137  K 4.5  < > 4.6 4.5  CL 107  < > 107 108  CO2 18*  --   --  23  GLUCOSE 98  < > 148* 141*  BUN 10  < > 9 8  CREATININE 1.17  < > 1.00 1.09  CALCIUM 8.8  --   --  8.0*  < > = values in this interval not displayed.  PT/INR:  Recent Labs  06/30/14 1300  LABPROT 18.2*  INR 1.50*   ABG    Component Value Date/Time   PHART 7.328* 07/01/2014 0506   HCO3 22.0 07/01/2014 0506   TCO2 23 07/01/2014 0506   ACIDBASEDEF 4.0*  07/01/2014 0506   O2SAT 98.0 07/01/2014 0506   CBG (last 3)   Recent Labs  07/01/14 0007 07/01/14 0415 07/01/14 0504  GLUCAP 138* 139* 144*    Assessment/Plan: S/P Procedure(s) (LRB): AORTIC VALVE REPLACEMENT (AVR) (N/A) INTRAOPERATIVE TRANSESOPHAGEAL ECHOCARDIOGRAM (N/A) -  POD # 1  Doing well  CV- appears to be in slow junctional rhythm under pacer- continue AP for now, hold lopressor  Tissue valve- no coumadin unless there is another indication  RESP_ IS for atelectasis  RENAL- lytes, creatinine OK, diurese  ENDO_ CBG OK off insulin drip  Anemia secondary to ABL- mild, follow  Enoxaparin + SCD for DVT prophylaxis   LOS: 1 day    Melrose Nakayama 07/01/2014

## 2014-07-02 ENCOUNTER — Inpatient Hospital Stay (HOSPITAL_COMMUNITY): Payer: Medicare Other

## 2014-07-02 LAB — BASIC METABOLIC PANEL
ANION GAP: 7 (ref 5–15)
BUN: 9 mg/dL (ref 6–23)
CALCIUM: 8 mg/dL — AB (ref 8.4–10.5)
CHLORIDE: 101 mmol/L (ref 96–112)
CO2: 24 mmol/L (ref 19–32)
CREATININE: 1.05 mg/dL (ref 0.50–1.35)
GFR calc Af Amer: 78 mL/min — ABNORMAL LOW (ref >90)
GFR calc non Af Amer: 67 mL/min — ABNORMAL LOW (ref >90)
GLUCOSE: 118 mg/dL — AB (ref 70–99)
Potassium: 3.9 mmol/L (ref 3.5–5.1)
SODIUM: 132 mmol/L — AB (ref 135–145)

## 2014-07-02 LAB — GLUCOSE, CAPILLARY
GLUCOSE-CAPILLARY: 115 mg/dL — AB (ref 70–99)
GLUCOSE-CAPILLARY: 108 mg/dL — AB (ref 70–99)
GLUCOSE-CAPILLARY: 101 mg/dL — AB (ref 70–99)
Glucose-Capillary: 112 mg/dL — ABNORMAL HIGH (ref 70–99)
Glucose-Capillary: 118 mg/dL — ABNORMAL HIGH (ref 70–99)
Glucose-Capillary: 122 mg/dL — ABNORMAL HIGH (ref 70–99)
Glucose-Capillary: 130 mg/dL — ABNORMAL HIGH (ref 70–99)

## 2014-07-02 LAB — CBC
HCT: 27.8 % — ABNORMAL LOW (ref 39.0–52.0)
Hemoglobin: 9.5 g/dL — ABNORMAL LOW (ref 13.0–17.0)
MCH: 34.8 pg — ABNORMAL HIGH (ref 26.0–34.0)
MCHC: 34.2 g/dL (ref 30.0–36.0)
MCV: 101.8 fL — ABNORMAL HIGH (ref 78.0–100.0)
Platelets: 132 10*3/uL — ABNORMAL LOW (ref 150–400)
RBC: 2.73 MIL/uL — AB (ref 4.22–5.81)
RDW: 12.6 % (ref 11.5–15.5)
WBC: 10.4 10*3/uL (ref 4.0–10.5)

## 2014-07-02 MED ORDER — ALUM & MAG HYDROXIDE-SIMETH 200-200-20 MG/5ML PO SUSP: 15.0000 mL | ORAL | Status: DC | PRN

## 2014-07-02 MED ORDER — SODIUM CHLORIDE 0.9 % IJ SOLN
3.0000 mL | Freq: Two times a day (BID) | INTRAMUSCULAR | Status: DC
  Administered 2014-07-03 – 2014-07-05 (×5): 3 mL via INTRAVENOUS

## 2014-07-02 MED ORDER — FUROSEMIDE 40 MG PO TABS
40.0000 mg | ORAL_TABLET | Freq: Every day | ORAL | Status: DC
  Administered 2014-07-02 – 2014-07-05 (×4): 40 mg via ORAL
  Filled 2014-07-02 (×4): qty 1

## 2014-07-02 MED ORDER — POTASSIUM CHLORIDE CRYS ER 20 MEQ PO TBCR
20.0000 meq | EXTENDED_RELEASE_TABLET | Freq: Two times a day (BID) | ORAL | Status: DC
  Administered 2014-07-02 – 2014-07-05 (×7): 20 meq via ORAL
  Filled 2014-07-02 (×9): qty 1

## 2014-07-02 MED ORDER — MOVING RIGHT ALONG BOOK
Freq: Once | Status: AC
  Administered 2014-07-02: 18:00:00
  Filled 2014-07-02: qty 1

## 2014-07-02 MED ORDER — MAGNESIUM HYDROXIDE 400 MG/5ML PO SUSP
30.0000 mL | Freq: Every day | ORAL | Status: DC | PRN
  Administered 2014-07-05: 30 mL via ORAL
  Filled 2014-07-02: qty 30

## 2014-07-02 MED ORDER — INSULIN ASPART 100 UNIT/ML ~~LOC~~ SOLN: 0.0000 [IU] | Freq: Three times a day (TID) | SUBCUTANEOUS | Status: DC

## 2014-07-02 MED ORDER — SODIUM CHLORIDE 0.9 % IV SOLN: 250.0000 mL | INTRAVENOUS | Status: DC | PRN

## 2014-07-02 MED ORDER — SODIUM CHLORIDE 0.9 % IJ SOLN: 3.0000 mL | INTRAMUSCULAR | Status: DC | PRN

## 2014-07-02 NOTE — Progress Notes (Signed)
Pt c/o nausea; + emesis; pt given IV Zofran at this time; will cont. To monitor.

## 2014-07-02 NOTE — Progress Notes (Signed)
Pt resting at this time; nausea resolved; will cont. To monitor.

## 2014-07-02 NOTE — Progress Notes (Signed)
2 Days Post-Op Procedure(s) (LRB): AORTIC VALVE REPLACEMENT (AVR) (N/A) INTRAOPERATIVE TRANSESOPHAGEAL ECHOCARDIOGRAM (N/A) Subjective: No complaints this morning  Objective: Vital signs in last 24 hours: Temp:  [97.8 F (36.6 C)-100.1 F (37.8 C)] 97.8 F (36.6 C) (04/17 0400) Pulse Rate:  [86-94] 90 (04/17 0700) Cardiac Rhythm:  [-] Atrial paced (04/17 0800) Resp:  [11-23] 17 (04/17 0700) BP: (92-116)/(52-68) 109/61 mmHg (04/17 0700) SpO2:  [91 %-100 %] 93 % (04/17 0700) Arterial Line BP: (104-144)/(45-65) 118/52 mmHg (04/17 0700) Weight:  [198 lb 6.6 oz (90 kg)] 198 lb 6.6 oz (90 kg) (04/17 0600)  Hemodynamic parameters for last 24 hours: PAP: (30-35)/(18-20) 35/20 mmHg  Intake/Output from previous day: 04/16 0701 - 04/17 0700 In: 2205.8 [P.O.:600; I.V.:1555.8; IV Piggyback:50] Out: 2370 [Urine:2290; Chest Tube:80] Intake/Output this shift: Total I/O In: 70 [I.V.:20; IV Piggyback:50] Out: -   General appearance: alert and no distress Neurologic: intact Heart: regular rate and rhythm Lungs: diminished breath sounds bibasilar Abdomen: normal findings: soft, non-tender  Lab Results:  Recent Labs  07/01/14 1020 07/01/14 1841 07/02/14 0418  WBC 12.0*  --  10.4  HGB 10.3* 9.5* 9.5*  HCT 30.0* 28.0* 27.8*  PLT 174  --  132*   BMET:  Recent Labs  07/01/14 0500  07/01/14 1841 07/02/14 0418  NA 137  --  137 132*  K 4.5  --  4.0 3.9  CL 108  --  103 101  CO2 23  --   --  24  GLUCOSE 141*  --  144* 118*  BUN 8  --  10 9  CREATININE 1.09  < > 1.10 1.05  CALCIUM 8.0*  --   --  8.0*  < > = values in this interval not displayed.  PT/INR:  Recent Labs  06/30/14 1300  LABPROT 18.2*  INR 1.50*   ABG    Component Value Date/Time   PHART 7.328* 07/01/2014 0506   HCO3 22.0 07/01/2014 0506   TCO2 19 07/01/2014 1841   ACIDBASEDEF 4.0* 07/01/2014 0506   O2SAT 98.0 07/01/2014 0506   CBG (last 3)   Recent Labs  07/01/14 1955 07/02/14 0015 07/02/14 0410   GLUCAP 137* 118* 115*    Assessment/Plan: S/P Procedure(s) (LRB): AORTIC VALVE REPLACEMENT (AVR) (N/A) INTRAOPERATIVE TRANSESOPHAGEAL ECHOCARDIOGRAM (N/A) Plan for transfer to step-down: see transfer orders   CV- in atrial bigeminy at 60 under pacer, paces with relatively long PR  Hold lopressor  ASA for tissue valve  RESP- continue IS  RENAL- creatinine OK, hyponatremia- mild, follow  ENDO- CBG well controlled, change to AC/HS  Refused lovenox- SCD for DVT prophylaxis   LOS: 2 days    Melrose Nakayama 07/02/2014

## 2014-07-02 NOTE — Progress Notes (Signed)
Pt transferred to 2w31; pt walked from 2S to 2w31; wife at bedside; pt denies pain; pt assisted into bed; ext pacer attached an AAI @ 90; pt due to void at 1700 today; will cont. To monitor.

## 2014-07-03 ENCOUNTER — Encounter (HOSPITAL_COMMUNITY): Payer: Self-pay | Admitting: Thoracic Surgery (Cardiothoracic Vascular Surgery)

## 2014-07-03 ENCOUNTER — Inpatient Hospital Stay (HOSPITAL_COMMUNITY): Payer: Medicare Other

## 2014-07-03 LAB — CBC
HCT: 25.9 % — ABNORMAL LOW (ref 39.0–52.0)
Hemoglobin: 9 g/dL — ABNORMAL LOW (ref 13.0–17.0)
MCH: 36 pg — AB (ref 26.0–34.0)
MCHC: 34.7 g/dL (ref 30.0–36.0)
MCV: 103.6 fL — AB (ref 78.0–100.0)
Platelets: 122 10*3/uL — ABNORMAL LOW (ref 150–400)
RBC: 2.5 MIL/uL — AB (ref 4.22–5.81)
RDW: 12.5 % (ref 11.5–15.5)
WBC: 6.9 10*3/uL (ref 4.0–10.5)

## 2014-07-03 LAB — BASIC METABOLIC PANEL
ANION GAP: 8 (ref 5–15)
BUN: 11 mg/dL (ref 6–23)
CALCIUM: 8.3 mg/dL — AB (ref 8.4–10.5)
CHLORIDE: 102 mmol/L (ref 96–112)
CO2: 25 mmol/L (ref 19–32)
Creatinine, Ser: 1.13 mg/dL (ref 0.50–1.35)
GFR calc Af Amer: 71 mL/min — ABNORMAL LOW (ref >90)
GFR calc non Af Amer: 61 mL/min — ABNORMAL LOW (ref >90)
Glucose, Bld: 96 mg/dL (ref 70–99)
Potassium: 4.1 mmol/L (ref 3.5–5.1)
Sodium: 135 mmol/L (ref 135–145)

## 2014-07-03 LAB — GLUCOSE, CAPILLARY
GLUCOSE-CAPILLARY: 107 mg/dL — AB (ref 70–99)
GLUCOSE-CAPILLARY: 114 mg/dL — AB (ref 70–99)
Glucose-Capillary: 88 mg/dL (ref 70–99)

## 2014-07-03 MED FILL — Mannitol IV Soln 20%: INTRAVENOUS | Qty: 500 | Status: AC

## 2014-07-03 MED FILL — Lidocaine HCl IV Inj 20 MG/ML: INTRAVENOUS | Qty: 5 | Status: AC

## 2014-07-03 MED FILL — Electrolyte-R (PH 7.4) Solution: INTRAVENOUS | Qty: 3000 | Status: AC

## 2014-07-03 MED FILL — Sodium Chloride IV Soln 0.9%: INTRAVENOUS | Qty: 2000 | Status: AC

## 2014-07-03 MED FILL — Sodium Bicarbonate IV Soln 8.4%: INTRAVENOUS | Qty: 50 | Status: AC

## 2014-07-03 MED FILL — Heparin Sodium (Porcine) Inj 1000 Unit/ML: INTRAMUSCULAR | Qty: 10 | Status: AC

## 2014-07-03 NOTE — Progress Notes (Signed)
CARDIAC REHAB PHASE I   PRE:  Rate/Rhythm: 60 SR atrial bigeminy    BP: sitting 116/70    SaO2: 98 RA  MODE:  Ambulation: 79 ft   POST:  Rate/Rhythm: 90 SR with much less freq PACS    BP: sitting 134/66     SaO2: 98 RA  Pt able to stand independently and use RW to walk. No assist needed. Long walk without c/o. HR more regular, less PACs with increased rate. Return to recliner. Encouraged more walks, pt needs motivation. [phone]  Josephina Shih [place] CES, ACSM 07/03/2014 11:15 AM

## 2014-07-03 NOTE — Progress Notes (Signed)
Pt/wife re-educated on activity restrictions, pain control options, and general safety concerns. Pt/wife understand restrictions and when to call for assistance. Pt resting with call bell within reach.  Will continue to monitor.

## 2014-07-03 NOTE — Progress Notes (Addendum)
      [address]       ,[place] [address]             [phone]      3 Days Post-Op Procedure(s) (LRB): AORTIC VALVE REPLACEMENT (AVR) (N/A) INTRAOPERATIVE TRANSESOPHAGEAL ECHOCARDIOGRAM (N/A)   Subjective:  Erik [name] has no complaints this morning.  He states he is doing pretty good.  He is ambulating with assistance.  + BM  Objective: Vital signs in last 24 hours: Temp:  [97.8 F (36.6 C)-98.3 F (36.8 C)] 98.2 F (36.8 C) (04/18 0522) Pulse Rate:  [88-91] 88 (04/18 0522) Cardiac Rhythm:  [-] Atrial paced;Bundle branch block (04/17 2002) Resp:  [11-16] 16 (04/18 0522) BP: (97-115)/(57-64) 115/59 mmHg (04/18 0522) SpO2:  [94 %-100 %] 97 % (04/18 0522) Arterial Line BP: (113)/(64) 113/64 mmHg (04/17 0900) Weight:  [198 lb (89.812 kg)] 198 lb (89.812 kg) (04/18 0522)  Intake/Output from previous day: 04/17 0701 - 04/18 0700 In: 500 [P.O.:370; I.V.:80; IV Piggyback:50] Out: 750 [Urine:750]  General appearance: alert, cooperative and no distress Heart: regular rate and rhythm and paced Lungs: clear to auscultation bilaterally Abdomen: soft, non-tender; bowel sounds normal; no masses,  no organomegaly Extremities: edema trace Wound: clean and dyr  Lab Results:  Recent Labs  07/02/14 0418 07/03/14 0405  WBC 10.4 6.9  HGB 9.5* 9.0*  HCT 27.8* 25.9*  PLT 132* 122*   BMET:  Recent Labs  07/02/14 0418 07/03/14 0405  NA 132* 135  K 3.9 4.1  CL 101 102  CO2 24 25  GLUCOSE 118* 96  BUN 9 11  CREATININE 1.05 1.13  CALCIUM 8.0* 8.3*    PT/INR:  Recent Labs  06/30/14 1300  LABPROT 18.2*  INR 1.50*   ABG    Component Value Date/Time   PHART 7.328* 07/01/2014 0506   HCO3 22.0 07/01/2014 0506   TCO2 19 07/01/2014 1841   ACIDBASEDEF 4.0* 07/01/2014 0506   O2SAT 98.0 07/01/2014 0506   CBG (last 3)   Recent Labs  07/02/14 1715 07/02/14 2234 07/03/14 0647  GLUCAP 112* 101* 107*    Assessment/Plan: S/P Procedure(s)  (LRB): AORTIC VALVE REPLACEMENT (AVR) (N/A) INTRAOPERATIVE TRANSESOPHAGEAL ECHOCARDIOGRAM (N/A)  1. CV- Paced at 90, currently NSR on monitor at 91- continue to hold Lopressor 2. Pulm- no acute issues, continue IS 3. Renal- creatinine mildly elevated at 1.13, remains hypervolemic continue Lasix 4. CBGs- controlled, not a diabetic will d/c SSIP 5. Dispo- patient stable, will get EKG to determine underlying rhythm, continue current care    LOS: 3 days    BARRETT, ERIN 07/03/2014  I have seen and examined the patient and agree with the assessment and plan as outlined.  Overall doing well POD3.  Expected post op acute blood loss anemia, stable.  Expected post op volume excess, mild, diuresing some.  Post op thrombocytopenia, mild.  Mobilize.  Diuresis.  Routine care.  Erik [name] 07/03/2014 9:53 AM

## 2014-07-03 NOTE — Clinical Documentation Improvement (Signed)
Platelets as below.  Please identify any clinical conditions associated with the abnormal platelet values, if any, and document in your progress note / carry over to the discharge summary.  Component      Platelets  Latest Ref Rng      150 - 400 K/uL  06/29/2014     2:37 PM 222  06/30/2014     10:18 AM 156  06/30/2014     1:00 PM 114 (L)  06/30/2014     6:45 PM 155  07/01/2014     5:00 AM 174  07/01/2014     10:20 AM 174  07/02/2014     4:18 AM 132 (L)  07/03/2014      122 (L)   Possible Clinical Conditions: -thrombocytopenia -other condition (please specify) -unable to determine at present  Thank you, Mateo Flow, RN [phone] Clinical Documentation Specialist

## 2014-07-03 NOTE — Progress Notes (Signed)
Medicare Important Message given? YES  Date Medicare IM given:  07/03/2014  Medicare IM given by: Marquee Fuchs  

## 2014-07-04 LAB — GLUCOSE, CAPILLARY
Glucose-Capillary: 105 mg/dL — ABNORMAL HIGH (ref 70–99)
Glucose-Capillary: 125 mg/dL — ABNORMAL HIGH (ref 70–99)

## 2014-07-04 MED ORDER — ASPIRIN EC 325 MG PO TBEC
325.0000 mg | DELAYED_RELEASE_TABLET | Freq: Every day | ORAL | Status: DC
  Administered 2014-07-04 – 2014-07-05 (×2): 325 mg via ORAL
  Filled 2014-07-04 (×2): qty 1

## 2014-07-04 MED ORDER — LISINOPRIL 5 MG PO TABS
5.0000 mg | ORAL_TABLET | Freq: Every day | ORAL | Status: DC
  Administered 2014-07-04 – 2014-07-05 (×2): 5 mg via ORAL
  Filled 2014-07-04 (×2): qty 1

## 2014-07-04 MED FILL — Magnesium Sulfate Inj 50%: INTRAMUSCULAR | Qty: 10 | Status: AC

## 2014-07-04 MED FILL — Heparin Sodium (Porcine) Inj 1000 Unit/ML: INTRAMUSCULAR | Qty: 30 | Status: AC

## 2014-07-04 MED FILL — Potassium Chloride Inj 2 mEq/ML: INTRAVENOUS | Qty: 40 | Status: AC

## 2014-07-04 NOTE — Progress Notes (Signed)
CARDIAC REHAB PHASE I    PRE:  Rate/Rhythm: 89, SR c/ PACs, very irregular ? A. fib  BP:  Sitting: 18/62        SaO2: 96 RA  MODE:  Ambulation: 750 ft   POST:  Rate/Rhythm: 98 SR c/ PACs, irregular  BP:  Sitting: 113/70         SaO2: 98 RA  Pt sitting in char this morning, stood independently. Pt ambulated 750 ft on RA, RW, hand held assist, standing rest x2. Slow but steady gait, tolerated fair.  Pt did c/o of feeling "more tired today than yesterday," mild SOB, pt states SOB is improving with activity. Pt c/o mild tingling around incision site during ambulation, resolved with rest. VSS. Reinforced IS, sternal precautions, pt verbalized understanding. Pt returned to chair, legs elevated, call bell within reach.   [phone]  Lenna Sciara, RN, BSN 07/04/2014 9:34 AM

## 2014-07-04 NOTE — Progress Notes (Signed)
Utilization review completed.  

## 2014-07-04 NOTE — Discharge Summary (Signed)
Physician Discharge Summary  Patient ID: Arvin Abello MRN: [id number] DOB/AGE: Aug 20, 1935 79 y.o.  Admit date: 06/30/2014 Discharge date: 07/05/2014  Admission Diagnoses:  Patient Active Problem List   Diagnosis Date Noted  . CHF (congestive heart failure) 06/23/2014  . Chronic diastolic congestive heart failure   . Elevated troponin 06/21/2014  . Pre-syncope 06/21/2014  . Aortic stenosis, severe   . Chest pain 06/19/2014   Discharge Diagnoses:   Patient Active Problem List   Diagnosis Date Noted  . S/P aortic valve replacement with bioprosthetic valve 06/30/2014  . CHF (congestive heart failure) 06/23/2014  . Chronic diastolic congestive heart failure   . Elevated troponin 06/21/2014  . Pre-syncope 06/21/2014  . Aortic stenosis, severe   . Chest pain 06/19/2014   Discharged Condition: good  History of Present Illness:  Mr. Franklin is a 79 yo white male who has been remarkably healthy for all of his life although many years ago he was told that he had a heart murmur on physical exam. He denies any previous knowledge of the diagnosis of aortic stenosis, although he thinks he may have had an echocardiogram done in the remote past. He has remained physically active and completely functionally independent without any significant limitations until recently. He states that over the last 6 months or so he has developed some mild exertional shortness of breath and fatigue. This would only develop with relatively strenuous exertion. He otherwise felt well until 06/19/2014 when he developed sudden onset of sharp substernal chest tightness and dizziness. At the time he had been working in his yard doing some fairly strenuous activity. He had to sit down for fear of passing out. His symptoms resolved within 5-10 minutes. He presented to the emergency department where troponin levels were minimally positive at 0.05 and BNP level was elevated to 1280. Head CT scan was normal. CT angiogram of the  chest was negative for pulmonary embolus. Cardiology consultation was requested and the patient was evaluated by Dr. Georgina Peer. He was found to have a harsh, late peaking systolic murmur suspicious for severe aortic stenosis. Transthoracic echocardiogram confirmed the presence of critical aortic stenosis with normal left ventricular systolic function. Peak velocity across the aortic valve measured in excess of 5.4 m/s corresponding to peak and mean transvalvular gradient estimated 119 and 58 mmHg, respectively. Left ventricular ejection fraction was estimated at 50-55%. Left and right heart catheterization was performed by Dr. Claiborne Billings on 06/22/2014. This confirmed the presence of severe aortic stenosis with peak to peak and mean transvalvular gradients measured 66 and 44 mmHg, respectively. Aortic valve area was calculated 0.58 cm. There was normal coronary artery anatomy with no significant coronary artery disease. Pulmonary artery pressures were normal. Cardiothoracic surgical consultation was requested.  He was evaluated by Dr. Roxy Manns on 4/8/2016n at which time he felt the patient would require Aortic Valve Replacement.  The risks and benefits of the procedure were explained to the patient and she was agreeable to proceed with surgery.  This was scheduled for 06/30/2014 and he was discharged home prior to the procedure.  Hospital Course:   Mr. Aragones presented to [facility] on 06/30/2014.  He was taken to the operating room and underwent Aortic Valve Replacement.  This was done utilizing a 25 mm [facility] Ease Pericardial Tissue Valve.  He tolerated the procedure well and was taken to the SICU in stable condition.  The patient was extubated the evening of surgery.  During his stay in the SICU he was found  to be in slow junctional rhythm under his pace maker.  His chest tubes and arterial lines were removed without difficulty.  The patients chest tube and arterial lines were removed without difficulty.   He was ambulating without difficulty and felt medically stable.    The patient continues to progress.  He continued to have some underlying arrythmia and is requiring back up pacing. This later resolved to NSR with 1st degree block. His rate was Bradycardic and he will continue to remain off all Beta Blockade.  His pacing wires were removed without difficulty.  He is ambulating independently.  He is tolerating a heart healthy diet.  He is felt medically stable for discharge today 07/05/2014.           Significant Diagnostic Studies: cardiac graphics:   Echocardiogram:   - Left ventricle: The cavity size was normal. Wall thickness was increased in a pattern of moderate LVH. Systolic function was normal. The estimated ejection fraction was in the range of 50% to 55%. Wall motion was normal; there were no regional wall motion abnormalities. Features are consistent with a pseudonormal left ventricular filling pattern, with concomitant abnormal relaxation and increased filling pressure (grade 2 diastolic dysfunction). - Aortic valve: There was critical stenosis. Valve area (VTI): 0.44 cm^2. Valve area (Vmax): 0.4 cm^2. Valve area (Vmean): 0.42 cm^2. - Mitral valve: Calcified annulus. Mildly thickened leaflets . - Right atrium: The atrium was mildly dilated.  Treatments: surgery:    Aortic Valve Replacement [employer] Magna Ease Pericardial Tissue Valve (size 25 mm, model # 3300TFX, serial # [id number])  Disposition: 01-Home or Self Care   Discharge Medications:  The patient has been discharged on:   1.Beta Blocker:  Yes [   ]                              No   [ x  ]                              If No, reason: bradycardia  2.Ace Inhibitor/ARB: Yes [ x  ]                                     No  [    ]                                     If No, reason:  3.Statin:   Yes [   ]                  No  [ x  ]                  If No, reason: No CAD  4.Shela [place]:  Yes  [  x  ]                  No   [   ]                  If No, reason:        Medication List    TAKE these medications        aspirin 325 MG tablet  Take 325 mg by mouth daily.  furosemide 40 MG tablet  Commonly known as:  LASIX  Take 1 tablet (40 mg total) by mouth daily. For 7 Days     lisinopril 5 MG tablet  Commonly known as:  PRINIVIL,ZESTRIL  Take 1 tablet (5 mg total) by mouth daily.     oxyCODONE 5 MG immediate release tablet  Commonly known as:  Oxy IR/ROXICODONE  Take 1-2 tablets (5-10 mg total) by mouth every 3 (three) hours as needed for severe pain.     potassium chloride SA 20 MEQ tablet  Commonly known as:  K-DUR,KLOR-CON  Take 1 tablet (20 mEq total) by mouth 2 (two) times daily. For 7 Days       Follow-up Information    Follow up with Rexene Alberts, MD On 07/31/2014.   Specialty:  Cardiothoracic Surgery   Why:  Appointment is at 11:30   Contact information:   [address] [place] [address] [phone]       Follow up with [facility] On 07/31/2014.   Why:  Please get CXR at 11:00   Contact information:   [facility]       Signed: Ellwood Handler 07/05/2014, 11:36 AM

## 2014-07-04 NOTE — Progress Notes (Signed)
DC EPW per MD orders and protocol; wires intact; no bleeding; patient is on bedrest for one hour; Q15 vitals for one hour; call bell within reach; will continue to monitor.  Rowe Pavy, RN

## 2014-07-04 NOTE — Progress Notes (Addendum)
      [address]       [place] [address]             [phone]        4 Days Post-Op Procedure(s) (LRB): AORTIC VALVE REPLACEMENT (AVR) (N/A) INTRAOPERATIVE TRANSESOPHAGEAL ECHOCARDIOGRAM (N/A)  Subjective: Patient passing flatus but no bowel movement yet.  Objective: Vital signs in last 24 hours: Temp:  [98.2 F (36.8 C)-98.9 F (37.2 C)] 98.2 F (36.8 C) (04/19 0542) Pulse Rate:  [72-93] 78 (04/19 0542) Cardiac Rhythm:  [-] Junctional rhythm (04/18 2358) Resp:  [17-18] 18 (04/19 0542) BP: (110-127)/(54-70) 127/70 mmHg (04/19 0542) SpO2:  [95 %-98 %] 97 % (04/19 0542) Weight:  [199 lb (90.266 kg)] 199 lb (90.266 kg) (04/19 0500)  Pre op weight 84 kg Current Weight  07/04/14 199 lb (90.266 kg)      Intake/Output from previous day: 04/18 0701 - 04/19 0700 In: 483 [P.O.:480; I.V.:3] Out: 1650 [Urine:1650]   Physical Exam:  Cardiovascular: IRRR Pulmonary: Clear to auscultation bilaterally; no rales, wheezes, or rhonchi. Abdomen: Soft, non tender, bowel sounds present. Extremities: Mild bilateral lower extremity edema. Wounds: Clean and dry.  No erythema or signs of infection.  Lab Results: CBC: Recent Labs  07/02/14 0418 07/03/14 0405  WBC 10.4 6.9  HGB 9.5* 9.0*  HCT 27.8* 25.9*  PLT 132* 122*   BMET:  Recent Labs  07/02/14 0418 07/03/14 0405  NA 132* 135  K 3.9 4.1  CL 101 102  CO2 24 25  GLUCOSE 118* 96  BUN 9 11  CREATININE 1.05 1.13  CALCIUM 8.0* 8.3*    PT/INR:  Lab Results  Component Value Date   INR 1.50* 06/30/2014   INR 1.11 06/29/2014   INR 1.13 06/22/2014   ABG:  INR: Will add last result for INR, ABG once components are confirmed Will add last 4 CBG results once components are confirmed  Assessment/Plan:  1. CV - Previously A paced at 90. SR/PVCs in the 70's this am. NOT on BB. Likely disconnect pacer. 2.  Pulmonary - Encourage incentive spirometer 3. Volume Overload - On Lasix 40 mg daily 4.   Acute blood loss anemia - H and H yesterday 9 and 25.9 5. Mild thrombocytopenia-platelets 122,000   ZIMMERMAN,DONIELLE MPA-C 07/04/2014,7:45 AM  I have seen and examined the patient and agree with the assessment and plan as outlined.  Doing well.  Rhythm sinus w/ long 1st degree AV block, possibly having occasional dropped beats related to 2nd degree block.  Continue to hold beta blockers and watch rhythm.  Start low dose ACE-I.  Possible d/c home 1-2 days.  Rexene Alberts 07/04/2014 8:31 AM

## 2014-07-04 NOTE — Progress Notes (Signed)
Pace wire d/c at 1130 . Pt tolerated well  Resting in bed with c/o at present pt also lay flat in bed post wire removal for 1 hr with no problem. Will continue to monitor remain Afib on the monitor HR 80.

## 2014-07-04 NOTE — Progress Notes (Signed)
Anesthesiology Follow-up:  Awake and alert, sitting in chair, in good spirits, breathing easier since surgery.  VS: T-36.8 BP-127/70 HR-78 (SR with long 1st degree AV block) RR-18 O2 Sat 97% on RA  K-4.2 BUN/Cr 11/1.13 glucose 100 H/H 9.0/25 Platelets 122,000  Doing well 4 days S/P AVR for severe AS. Extubated 5 1/2 hours post-op. Doing well, should be ready for discharge after underlying rhythm issues resolved.  Roberts Gaudy

## 2014-07-04 NOTE — Addendum Note (Signed)
Addendum  created 07/04/14 0908 by Roberts Gaudy, MD   Modules edited: Clinical Notes   Clinical Notes:  File: [id number]; Pend: [id number]; Pend: [id number]; Pend: [id number]

## 2014-07-04 NOTE — Discharge Instructions (Signed)
Aortic Valve Replacement, Care After °Refer to this sheet in the next few weeks. These instructions provide you with information on caring for yourself after your procedure. Your health care provider may also give you specific instructions. Your treatment has been planned according to current medical practices, but problems sometimes occur. Call your health care provider if you have any problems or questions after your procedure. °HOME CARE INSTRUCTIONS  °· Take medicines only as directed by your health care provider. °· If your health care provider has prescribed elastic stockings, wear them as directed. °· Take frequent naps or rest often throughout the day. °· Avoid lifting over 10 lbs (4.5 kg) or pushing or pulling things with your arms for 6-8 weeks or as directed by your health care provider. °· Avoid driving or airplane travel for 4-6 weeks after surgery or as directed by your health care provider. If you are riding in a car for an extended period, stop every 1-2 hours to stretch your legs. Keep a record of your medicines and medical history with you when traveling. °· Do not drive or operate heavy machinery while taking pain medicine. (narcotics). °· Do not cross your legs. °· Do not use any tobacco products including cigarettes, chewing tobacco, or electronic cigarettes. If you need help quitting, ask your health care provider. °· Do not take baths, swim, or use a hot tub until your health care provider approves. Take showers once your health care provider approves. Pat incisions dry. Do not rub incisions with a washcloth or towel. °· Avoid climbing stairs and using the handrail to pull yourself up for the first 2-3 weeks after surgery. °· Return to work as directed by your health care provider. °· Drink enough fluid to keep your urine clear or pale yellow. °· Do not strain to have a bowel movement. Eat high-fiber foods if you become constipated. You may also take a medicine to help you have a bowel  movement (laxative) as directed by your health care provider. °· Resume sexual activity as directed by your health care provider. Men should not use medicines for erectile dysfunction until their doctor says it is okay. °· If you had a certain type of heart condition in the past, you may need to take antibiotic medicine before having dental work or surgery. Let your dentist and health care providers know if you had one or more of the following: °¨ Previous endocarditis. °¨ An artificial (prosthetic) heart valve. °¨ Congenital heart disease. °SEEK MEDICAL CARE IF: °· You develop a skin rash.   °· You experience sudden changes in your weight. °· You have a fever. °SEEK IMMEDIATE MEDICAL CARE IF:  °· You develop chest pain that is not coming from your incision. °· You have drainage (pus), redness, swelling, or pain at your incision site.   °· You develop shortness of breath or have difficulty breathing.   °· You have increased bleeding from your incision site.   °· You develop light-headedness.   °MAKE SURE YOU:  °· Understand these directions. °· Will watch your condition. °· Will get help right away if you are not doing well or get worse. °Document Released: 09/19/2004 Document Revised: 07/18/2013 Document Reviewed: 12/16/2011 °ExitCare® Patient Information ©2015 [employer]. This information is not intended to replace advice given to you by your health care provider. Make sure you discuss any questions you have with your health care provider. ° °

## 2014-07-05 LAB — CBC
HEMATOCRIT: 25.2 % — AB (ref 39.0–52.0)
Hemoglobin: 8.8 g/dL — ABNORMAL LOW (ref 13.0–17.0)
MCH: 35.6 pg — AB (ref 26.0–34.0)
MCHC: 34.9 g/dL (ref 30.0–36.0)
MCV: 102 fL — AB (ref 78.0–100.0)
PLATELETS: 206 10*3/uL (ref 150–400)
RBC: 2.47 MIL/uL — ABNORMAL LOW (ref 4.22–5.81)
RDW: 12.3 % (ref 11.5–15.5)
WBC: 5.9 10*3/uL (ref 4.0–10.5)

## 2014-07-05 LAB — BASIC METABOLIC PANEL
ANION GAP: 9 (ref 5–15)
BUN: 16 mg/dL (ref 6–23)
CALCIUM: 8.7 mg/dL (ref 8.4–10.5)
CHLORIDE: 99 mmol/L (ref 96–112)
CO2: 26 mmol/L (ref 19–32)
CREATININE: 1.22 mg/dL (ref 0.50–1.35)
GFR calc Af Amer: 65 mL/min — ABNORMAL LOW (ref >90)
GFR, EST NON AFRICAN AMERICAN: 56 mL/min — AB (ref >90)
GLUCOSE: 105 mg/dL — AB (ref 70–99)
Potassium: 3.8 mmol/L (ref 3.5–5.1)
Sodium: 134 mmol/L — ABNORMAL LOW (ref 135–145)

## 2014-07-05 LAB — GLUCOSE, CAPILLARY
GLUCOSE-CAPILLARY: 116 mg/dL — AB (ref 70–99)
Glucose-Capillary: 94 mg/dL (ref 70–99)

## 2014-07-05 MED ORDER — BISACODYL 10 MG RE SUPP
10.0000 mg | Freq: Once | RECTAL | Status: AC
  Administered 2014-07-05: 10 mg via RECTAL
  Filled 2014-07-05: qty 1

## 2014-07-05 MED ORDER — OXYCODONE HCL 5 MG PO TABS: 5.0000 mg | ORAL_TABLET | ORAL | Status: DC | PRN

## 2014-07-05 MED ORDER — POTASSIUM CHLORIDE CRYS ER 20 MEQ PO TBCR: 20.0000 meq | EXTENDED_RELEASE_TABLET | Freq: Two times a day (BID) | ORAL | Status: DC

## 2014-07-05 MED ORDER — LACTULOSE 10 GM/15ML PO SOLN
20.0000 g | Freq: Every day | ORAL | Status: DC | PRN
Start: 2014-07-05 — End: 2014-07-05
  Administered 2014-07-05: 20 g via ORAL
  Filled 2014-07-05 (×2): qty 30

## 2014-07-05 MED ORDER — FUROSEMIDE 40 MG PO TABS: 40.0000 mg | ORAL_TABLET | Freq: Every day | ORAL | Status: DC

## 2014-07-05 MED ORDER — LISINOPRIL 5 MG PO TABS: 5.0000 mg | ORAL_TABLET | Freq: Every day | ORAL | Status: DC

## 2014-07-05 NOTE — Progress Notes (Signed)
Pt. Given 10 mg dulcolax suppository at 11:45 no relief of constipation. Notified Ellwood Handler, PA. Order placed for 20 g of lactulose solution to be given to help induce bowel movement. Discharge anticipated following bowel movement. Wardell Honour, RN

## 2014-07-05 NOTE — Progress Notes (Signed)
[id number] Observed pt up in hall with wife walking earlier. Education completed with pt and wife. Understanding voiced. Notified case manager that pt would like rolling walker for home use. Has been walking with walker here. Discussed CRP 2 but pt not interested in referral as he stated does not want scheduled exercise. Left brochure in case he changes his mind. Put on discharge video for them to view. Encouraged watching sodium and cutting down on carbs. Graylon Good RN BSN 07/05/2014 11:25 AM

## 2014-07-05 NOTE — Progress Notes (Signed)
TCTS BRIEF PROGRESS NOTE   Doing well but hasn't had a bowel movement yet Tentatively plan d/c home today Try dulcolax supp this morning  Rexene Alberts 07/05/2014 9:00 AM

## 2014-07-05 NOTE — Care Management Note (Signed)
    Page 1 of 1   07/05/2014     4:52:14 PM CARE MANAGEMENT NOTE 07/05/2014  Patient:  Erik [name], Erik [name]   Account Number:  [id number]  Date Initiated:  06/30/2014  Documentation initiated by:  Luz Lex  Subjective/Objective Assessment:   Post op AVR.     Action/Plan:   Anticipated DC Date:  07/05/2014   Anticipated DC Plan:  [place]  CM consult      Choice offered to / List presented to:     DME arranged  Vassie Moselle      DME agency  [facility].        Status of service:  Completed, signed off Medicare Important Message given?  YES (If response is "NO", the following Medicare IM given date fields will be blank) Date Medicare IM given:  07/03/2014 Medicare IM given by:  [facility] Date Additional Medicare IM given:   Additional Medicare IM given by:    Discharge Disposition:  HOME/SELF CARE  Per UR Regulation:  Reviewed for med. necessity/level of care/duration of stay  If discussed at Long Length of Stay Meetings, dates discussed:    Comments:  [employer], [place] [phone]  07/05/14- 66- Marvetta Gibbons RN, BSN [phone] Pt for home today, needs RW- order placed- call made to [facility] for DME need spoke with Brenton Grills- RW to be delivered to pt's room prior to discharge

## 2014-07-10 ENCOUNTER — Telehealth: Payer: Self-pay

## 2014-07-10 DIAGNOSIS — G8918 Other acute postprocedural pain: Secondary | ICD-10-CM

## 2014-07-10 MED ORDER — OXYCODONE HCL 5 MG PO TABS: 5.0000 mg | ORAL_TABLET | ORAL | Status: DC | PRN

## 2014-07-10 NOTE — Telephone Encounter (Signed)
RX refill for oxycodone printed out and signed. Pt's wife will pick up at front desk today

## 2014-07-14 ENCOUNTER — Ambulatory Visit
Admission: RE | Admit: 2014-07-14 | Discharge: 2014-07-14 | Disposition: A | Discharge status: Home / Self Care | Payer: Medicare Other | Source: Ambulatory Visit | Attending: Thoracic Surgery (Cardiothoracic Vascular Surgery) | Admitting: Thoracic Surgery (Cardiothoracic Vascular Surgery)

## 2014-07-14 ENCOUNTER — Ambulatory Visit (INDEPENDENT_AMBULATORY_CARE_PROVIDER_SITE_OTHER): Payer: Self-pay | Admitting: Thoracic Surgery (Cardiothoracic Vascular Surgery)

## 2014-07-14 ENCOUNTER — Other Ambulatory Visit: Payer: Self-pay

## 2014-07-14 ENCOUNTER — Encounter: Payer: Self-pay | Admitting: Thoracic Surgery (Cardiothoracic Vascular Surgery)

## 2014-07-14 VITALS — BP 133/80 | HR 67 | Resp 20 | Ht 73.0 in | Wt 186.0 lb

## 2014-07-14 DIAGNOSIS — Z952 Presence of prosthetic heart valve: Secondary | ICD-10-CM

## 2014-07-14 DIAGNOSIS — Z954 Presence of other heart-valve replacement: Secondary | ICD-10-CM

## 2014-07-14 DIAGNOSIS — R0602 Shortness of breath: Secondary | ICD-10-CM | POA: Diagnosis not present

## 2014-07-14 DIAGNOSIS — Z953 Presence of xenogenic heart valve: Secondary | ICD-10-CM

## 2014-07-14 MED ORDER — TRAMADOL HCL 50 MG PO TABS: 50.0000 mg | ORAL_TABLET | Freq: Four times a day (QID) | ORAL | Status: DC | PRN

## 2014-07-14 NOTE — Progress Notes (Signed)
[address]       [place] [address]             [place] OFFICE NOTE  Referring Provider is Troy Sine, MD PCP is Jani Gravel, MD   HPI:  Patient returns to the office today as an unscheduled visit status post aortic valve replacement using a bioprosthetic tissue valve on 06/30/2014 for severe symptomatic aortic stenosis.  His postoperative recovery in the hospital was uncomplicated although notable for sinus bradycardia for which beta blocker therapy was held. He was discharged from the hospital on the fifth postoperative day. The patient is brought to the office today by his wife with several complaints. Oxycodone has been associated with hallucinations and disorientation. The patient has felt tired.  His appetite remains marginal although he is eating some. His bowels are functioning normally. He denies any shortness of breath. He has minimal pain and states that really his chest only hurts when he coughs. He has been coughing up a small amount of clear thick phlegm. He denies any purulent sputum production, fevers, chills, or diaphoresis.  His weight has continued to fall since hospital discharge and the patient is reportedly now below his preoperative baseline weight. He completed his short course of oral diuretic therapy.   Current Outpatient Prescriptions  Medication Sig Dispense Refill  . aspirin 325 MG tablet Take 325 mg by mouth daily.    Marland Kitchen lisinopril (PRINIVIL,ZESTRIL) 5 MG tablet Take 1 tablet (5 mg total) by mouth daily. 30 tablet 3  . traMADol (ULTRAM) 50 MG tablet Take 1 tablet (50 mg total) by mouth every 6 (six) hours as needed for moderate pain or severe pain. 40 tablet 0   No current facility-administered medications for this visit.      Physical Exam:   BP 133/80 mmHg  Pulse 67  Resp 20  Ht 6\' 1"  (1.854 m)  Wt 186 lb (84.369 kg)  BMI 24.55 kg/m2  SpO2 97%  General:  Well-appearing  Chest:   Slightly  diminished breath sounds left lung base with inspiratory crackles, no wheezing  CV:   Regular rate and rhythm with soft systolic murmur best left lower sternal border  Incisions:  Healing nicely, sternum is stable  Abdomen:  Soft and nontender  Extremities:  Warm and well-perfused, no lower extremity edema.  Diagnostic Tests:  CHEST 2 VIEW  COMPARISON: 07/03/2014 .  FINDINGS: Cardiomegaly. Prior cardiac valve replacement. Pulmonary vascularity is normal. Left lower lobe subsegmental atelectasis and or infiltrate noted. Small left pleural effusion. No pneumothorax. No acute bony abnormality.  IMPRESSION: 1. Cardiomegaly. Prior cardiac valve replacement. No pulmonary venous congestion. 2. Left lower lobe atelectasis and/or infiltrate with small left pleural effusion. Left lower lobe pneumonia cannot be excluded .   Electronically Signed  By: Marcello Moores Register  On: 07/14/2014 10:05   Impression:  Patient is progressing acceptably well just 2 weeks following elective aortic valve replacement. He has been intolerant of oxycodone therapy with associated hallucinations and disorientation. He has a slight cough and left basilar atelectasis with small left pleural effusion noted on chest x-ray today.    Plan:  I have instructed the patient to stop taking oxycodone and use Tylenol primarily for pain. I given him a prescription for tramadol to use for more severe pain. I've encouraged the patient to increase his activity and to specifically attempt to go for good walk at least twice a day. We have  talked about using his incentive spirometer more. I suggested that he might benefit from a short course of Mucinex to help breakup secretions. The patient will return for routine follow-up and repeat chest x-ray in 2 weeks. He has been instructed to call and return sooner should he develop purulent sputum production, fevers, chills, or increased shortness of breath.   Valentina Gu. Roxy Manns,  MD 07/14/2014 10:33 AM

## 2014-07-14 NOTE — Patient Instructions (Signed)
Continue to increase amount of walking, deep breathing and use of spirometer  Take mucinex twice daily x 5 days to help loosen secretions  Stop taking oxycodone and use tylenol primarily for pain.  Use tramadol for more severe pain.  Call if you develop fevers, chills, or more green/yellow sputum production

## 2014-07-25 ENCOUNTER — Other Ambulatory Visit: Payer: Self-pay | Admitting: Thoracic Surgery (Cardiothoracic Vascular Surgery)

## 2014-07-25 DIAGNOSIS — Z952 Presence of prosthetic heart valve: Secondary | ICD-10-CM

## 2014-07-26 NOTE — Progress Notes (Signed)
07/27/2014 Erik [name]   1935-12-17  [id number]  Primary Physician Jani Gravel, MD Primary Cardiologist: Dr. Claiborne Billings  Reason for visit/CC: Post hospital follow-up for CAD/status post AVR  HPI:  The patient is a 66 male who presents to clinic today for post hospital follow-up. He has a history of severe aortic stenosis. Recent left heart catheterization revealed normal coronary arteries and normal LV function with ejection fraction of 50%. He was admitted to [facility] on 06/30/2014 to undergo aortic valve replacement with a pericardial tissue valve. This was performed by Dr. Roxy Manns. The remainder of his hospital course was benign.   Today in follow-up, he reports that he has done well since discharge. He denies any chest pain, dyspnea, syncope/near-syncope.  His EKG today demonstrates atrial flutter with a CVR (new diagnosis). HR is 79 bpm. He is asymptomatic. Risk factors are age >77.    Current Outpatient Prescriptions  Medication Sig Dispense Refill  . aspirin 325 MG tablet Take 325 mg by mouth daily.    Marland Kitchen lisinopril (PRINIVIL,ZESTRIL) 5 MG tablet Take 1 tablet (5 mg total) by mouth daily. 30 tablet 3  . metoprolol tartrate (LOPRESSOR) 25 MG tablet Take 0.5 tablets (12.5 mg total) by mouth 2 (two) times daily. 90 tablet 3   No current facility-administered medications for this visit.    No Known Allergies  History   Social History  . Marital Status: Married    Spouse Name: N/A  . Number of Children: N/A  . Years of Education: N/A   Occupational History  . Not on file.   Social History Main Topics  . Smoking status: Former Research scientist (life sciences)  . Smokeless tobacco: Never Used     Comment: smoked in college  . Alcohol Use: Not on file     Comment: occ   . Drug Use: No  . Sexual Activity: Not on file   Other Topics Concern  . Not on file   Social History Narrative     Review of Systems: General: negative for chills, fever, night sweats or weight changes.    Cardiovascular: negative for chest pain, dyspnea on exertion, edema, orthopnea, palpitations, paroxysmal nocturnal dyspnea or shortness of breath Dermatological: negative for rash Respiratory: negative for cough or wheezing Urologic: negative for hematuria Abdominal: negative for nausea, vomiting, diarrhea, bright red blood per rectum, melena, or hematemesis Neurologic: negative for visual changes, syncope, or dizziness All other systems reviewed and are otherwise negative except as noted above.    Blood pressure 120/60, pulse 79, height 6\' 1"  (1.854 m), weight 175 lb 6.4 oz (79.561 kg).  General appearance: alert, cooperative and no distress Neck: no adenopathy and no carotid bruit Lungs: clear to auscultation bilaterally Heart:irreguarlly irregular rhythm  Extremities: no LEE Pulses: 2+ and symmetric Skin: warm and dy Neurologic: Grossly normal  EKG Atrial Flutter with a CVS of 79 bpm   ASSESSMENT AND PLAN:   1. Severe aortic stenosis: Status post successful aortic valve replacement by Dr. Roxy Manns. Pericardial tissue valve. Stable without angina, dyspnea, syncope/near-syncope. Normal LV function.  2. New Onset Atrial Flutter: Demonstrated on EKG. CVR in the 70s. Asymptomatic. May be secondary to recent open heart surgery, however will also check TSH, K and Mg to assess for reversible causes (thyroid dysfunction and electrolytes abnormalities). I've discussed case with Dr. Claiborne Billings, his primary cardiologist. He recommended that we start him on a low dose BB, 12.5 mg of metoprolol BID. He also recommended anticoagulation, at least short term. His CHA2DS2 VASc  score is 2 for Age >75. Not a candidate for a NOAC given valvular disease. Would have to take Coumadin. This was discussed with the patient and he remains very hesitant. He is in agreement to start BB but wishes to wait until he sees Dr. Roxy Manns next week to further discuss. I notified the patient of his risk for stoke without prophylaxis,  given his risk factors. He understands his risk but still wants to delay initiation until he gets a second opinion. For now he will continue high dose ASA, 325 daily.    PLAN  follow-up with Dr. Claiborne Billings in 2-3 months.  Monquie Fulgham, BRITTAINYPA-C 07/27/2014 10:18 AM

## 2014-07-27 ENCOUNTER — Encounter: Payer: Self-pay | Admitting: Cardiology

## 2014-07-27 ENCOUNTER — Telehealth: Payer: Self-pay | Admitting: Cardiovascular Disease

## 2014-07-27 ENCOUNTER — Ambulatory Visit (INDEPENDENT_AMBULATORY_CARE_PROVIDER_SITE_OTHER): Payer: Medicare Other | Admitting: Cardiology

## 2014-07-27 VITALS — BP 120/60 | HR 79 | Ht 73.0 in | Wt 175.4 lb

## 2014-07-27 DIAGNOSIS — I4892 Unspecified atrial flutter: Secondary | ICD-10-CM

## 2014-07-27 DIAGNOSIS — I25119 Atherosclerotic heart disease of native coronary artery with unspecified angina pectoris: Secondary | ICD-10-CM

## 2014-07-27 DIAGNOSIS — I509 Heart failure, unspecified: Secondary | ICD-10-CM | POA: Diagnosis not present

## 2014-07-27 DIAGNOSIS — I5021 Acute systolic (congestive) heart failure: Secondary | ICD-10-CM | POA: Diagnosis not present

## 2014-07-27 HISTORY — DX: Unspecified atrial flutter: I48.92

## 2014-07-27 LAB — TSH: TSH: 1.325 u[IU]/mL (ref 0.350–4.500)

## 2014-07-27 LAB — BASIC METABOLIC PANEL WITH GFR
BUN: 14 mg/dL (ref 6–23)
CO2: 23 meq/L (ref 19–32)
CREATININE: 1.16 mg/dL (ref 0.50–1.35)
Calcium: 9.8 mg/dL (ref 8.4–10.5)
Chloride: 102 mEq/L (ref 96–112)
GFR, Est African American: 70 mL/min
GFR, Est Non African American: 61 mL/min
Glucose, Bld: 97 mg/dL (ref 70–99)
Potassium: 5.1 mEq/L (ref 3.5–5.3)
Sodium: 136 mEq/L (ref 135–145)

## 2014-07-27 LAB — MAGNESIUM: Magnesium: 2.3 mg/dL (ref 1.5–2.5)

## 2014-07-27 MED ORDER — METOPROLOL TARTRATE 25 MG PO TABS: 12.5000 mg | ORAL_TABLET | Freq: Two times a day (BID) | ORAL | Status: DC

## 2014-07-27 NOTE — Patient Instructions (Addendum)
Your physician recommends that you schedule a follow-up appointment in: 4 WEEKS WITH DR Claiborne Billings  START METOPROLOL TARTRATE 12.5 MG (1/2 TABLET) TWICE DAILY  Your physician recommends that you HAVE LAB WORK TODAY

## 2014-07-27 NOTE — Telephone Encounter (Signed)
Refill submitted to patient's preferred pharmacy. Informed patient. Pt voiced understanding, no other stated concerns at this time.  

## 2014-07-27 NOTE — Telephone Encounter (Signed)
Pt's wife called in stating the his Metoprolol prescription was called into the wrong pharmacy. She would like it called in to the [facility] in [place]. Please call  Thanks

## 2014-07-31 ENCOUNTER — Encounter: Payer: Self-pay | Admitting: Thoracic Surgery (Cardiothoracic Vascular Surgery)

## 2014-07-31 ENCOUNTER — Ambulatory Visit
Admission: RE | Admit: 2014-07-31 | Discharge: 2014-07-31 | Disposition: A | Discharge status: Home / Self Care | Payer: Medicare Other | Source: Ambulatory Visit | Attending: Thoracic Surgery (Cardiothoracic Vascular Surgery) | Admitting: Thoracic Surgery (Cardiothoracic Vascular Surgery)

## 2014-07-31 ENCOUNTER — Ambulatory Visit (INDEPENDENT_AMBULATORY_CARE_PROVIDER_SITE_OTHER): Payer: Self-pay | Admitting: Thoracic Surgery (Cardiothoracic Vascular Surgery)

## 2014-07-31 VITALS — BP 119/67 | HR 64 | Resp 16 | Ht 73.0 in | Wt 172.0 lb

## 2014-07-31 DIAGNOSIS — J90 Pleural effusion, not elsewhere classified: Secondary | ICD-10-CM | POA: Diagnosis not present

## 2014-07-31 DIAGNOSIS — Z953 Presence of xenogenic heart valve: Secondary | ICD-10-CM

## 2014-07-31 DIAGNOSIS — Z952 Presence of prosthetic heart valve: Secondary | ICD-10-CM

## 2014-07-31 DIAGNOSIS — I350 Nonrheumatic aortic (valve) stenosis: Secondary | ICD-10-CM

## 2014-07-31 DIAGNOSIS — I4892 Unspecified atrial flutter: Secondary | ICD-10-CM

## 2014-07-31 DIAGNOSIS — Z954 Presence of other heart-valve replacement: Secondary | ICD-10-CM

## 2014-07-31 NOTE — Progress Notes (Signed)
[address]       [place] [address]             [place] OFFICE NOTE  Referring Provider is Troy Sine, MD PCP is Jani Gravel, MD   HPI:  Patient returns to the office today as an unscheduled visit status post aortic valve replacement using a bioprosthetic tissue valve on 06/30/2014 for severe symptomatic aortic stenosis. His postoperative recovery in the hospital was uncomplicated although notable for sinus bradycardia for which beta blocker therapy was held. He was discharged from the hospital on the fifth postoperative day.  He was last seen here in our office on 07/14/2014 at which time he was making satisfactory progress. Since then he has done very well clinically. He was seen in follow-up last week at Dr. Evette Georges office. Although the patient was feeling quite well, he was found to be in atrial flutter with controlled ventricular rate on routine follow-up EKG. The patient was prescribed low-dose beta blocker and Coumadin at that time, but the patient declined to fill the prescriptions. He presents to our office today for further follow-up.  He reports feeling quite well. He has no significant residual soreness in his chest and he has not been taking any sort of pain relievers. He has no shortness of breath and his energy level is quite good. He is to increase his physical activity. He is sleeping well at night. He has not had any dizzy spells or tachy palpitations.   Current Outpatient Prescriptions  Medication Sig Dispense Refill  . aspirin 325 MG tablet Take 325 mg by mouth daily.    Marland Kitchen lisinopril (PRINIVIL,ZESTRIL) 5 MG tablet Take 1 tablet (5 mg total) by mouth daily. 30 tablet 3  . metoprolol tartrate (LOPRESSOR) 25 MG tablet Take 0.5 tablets (12.5 mg total) by mouth 2 (two) times daily. 90 tablet 3   No current facility-administered medications for this visit.      Physical Exam:   BP 119/67 mmHg  Pulse 64  Resp  16  Ht 6\' 1"  (1.854 m)  Wt 172 lb (78.019 kg)  BMI 22.70 kg/m2  SpO2 98%  General:  Well-appearing  Chest:   clear  CV:   Irregular rhythm with regular rate  Incisions:  Healing nicely, sternum is stable  Abdomen:  Soft nontender  Extremities:  Warm and well-perfused  Diagnostic Tests:  CHEST 2 VIEW  COMPARISON: PA and lateral chest of July 14, 2014  FINDINGS: The right lung well-expanded and clear. On the left there is persistent mild volume loss with small pleural effusion unchanged since the previous exam. The heart is top normal in size. The pulmonary vascularity is normal. There are 8 intact sternal wires. The prosthetic aortic valve ring is visible.  IMPRESSION: Stable volume loss on the left consistent with pleural effusion and basilar atelectasis. There is no pulmonary edema.   Electronically Signed  By: David Martinique M.D.  On: 07/31/2014 11:04   Impression:  Patient is clinically doing very well approximately one month status post aortic valve replacement using a bioprosthetic tissue valve. However, the patient has gone into atrial flutter with a controlled ventricular rate.  Prior to hospital discharge he was taken off of all beta blockers because of bradycardia. Under the circumstances I agree with plans to proceed with short-term anticoagulation using warfarin because of the patient's persistent atrial flutter and recent aortic valve replacement using a bioprosthetic tissue valve. Because  the patient's heart rate remains well-controlled and he had a history of bradycardia, I might hold off on adding a beta blocker at this time. If the patient does not spontaneously convert back to sinus rhythm it might be reasonable to consider amiodarone.   Plan:  I have advised the patient it would be best to fill the prescription for Coumadin offered to him by Lyda Jester at Dr. Evette Georges office. I have explained the small but significant risk of thromboembolism  and stroke associated with atrial fibrillation and atrial flutter during the postoperative period following valve replacement surgery.  The patient asked about doubling up on aspirin dose as an alternative, and I have advised against this.  Otherwise the patient may continue to gradually increase his physical activity as tolerated. We have discussed his participation in outpatient cardiac rehabilitation program. Once his arrhythmias have been resolved the patient may resume driving an automobile.  All of his questions have been addressed.  The patient will return for further follow-up and rhythm check in 2 months. He will continue to follow up closely at Dr. Evette Georges office for management of his arrhythmia and anticoagulation therapy.   Erik Gu. Roxy Manns, MD 07/31/2014 12:22 PM

## 2014-07-31 NOTE — Patient Instructions (Signed)
I recommend beginning warfarin (Coumadin) therapy as prescribed at Dr Evette Georges office last week.  The patient should continue to avoid any heavy lifting or strenuous use of arms or shoulders for at least a total of three months from the time of surgery.  The patient is encouraged to enroll and participate in the outpatient cardiac rehab program beginning as soon as practical.  The patient may return to driving an automobile once he is cleared by Dr Claiborne Billings, as long as they are no longer requiring oral narcotic pain relievers during the daytime.  It would be wise to start driving only short distances during the daylight and gradually increase from there as they feel comfortable.

## 2014-08-03 ENCOUNTER — Ambulatory Visit: Payer: Medicare Other | Admitting: Pharmacist Clinician (PhC)/ Clinical Pharmacy Specialist

## 2014-08-03 MED ORDER — WARFARIN SODIUM 5 MG PO TABS: ORAL_TABLET | ORAL | Status: DC

## 2014-08-03 NOTE — Patient Instructions (Signed)
Pt did not yet start warfarin.  Wanted to talk about concerns/fears in regards to medication first.  Spent 20 minutes reviewing medication including monitoring, diet, safety information and dosing.  Pt now more comfortable with taking.  Will take for 2-3 months, post AVR with tissue valve on 06/30/14.  Also on aspirin daily.

## 2014-08-09 ENCOUNTER — Ambulatory Visit (INDEPENDENT_AMBULATORY_CARE_PROVIDER_SITE_OTHER): Payer: Medicare Other | Admitting: Pharmacist Clinician (PhC)/ Clinical Pharmacy Specialist

## 2014-08-09 DIAGNOSIS — Z954 Presence of other heart-valve replacement: Secondary | ICD-10-CM | POA: Diagnosis not present

## 2014-08-09 DIAGNOSIS — Z7901 Long term (current) use of anticoagulants: Secondary | ICD-10-CM | POA: Insufficient documentation

## 2014-08-09 DIAGNOSIS — Z953 Presence of xenogenic heart valve: Secondary | ICD-10-CM

## 2014-08-09 LAB — POCT INR: INR: 1.3

## 2014-08-16 ENCOUNTER — Ambulatory Visit (INDEPENDENT_AMBULATORY_CARE_PROVIDER_SITE_OTHER): Payer: Medicare Other | Admitting: Pharmacist Clinician (PhC)/ Clinical Pharmacy Specialist

## 2014-08-16 ENCOUNTER — Ambulatory Visit: Payer: Medicare Other | Admitting: Pharmacist Clinician (PhC)/ Clinical Pharmacy Specialist

## 2014-08-16 DIAGNOSIS — Z7901 Long term (current) use of anticoagulants: Secondary | ICD-10-CM

## 2014-08-16 DIAGNOSIS — Z953 Presence of xenogenic heart valve: Secondary | ICD-10-CM

## 2014-08-16 DIAGNOSIS — Z954 Presence of other heart-valve replacement: Secondary | ICD-10-CM

## 2014-08-16 LAB — POCT INR: INR: 1.9

## 2014-08-17 ENCOUNTER — Other Ambulatory Visit: Payer: Self-pay | Admitting: Pharmacist Clinician (PhC)/ Clinical Pharmacy Specialist

## 2014-08-17 MED ORDER — WARFARIN SODIUM 5 MG PO TABS: ORAL_TABLET | ORAL | Status: DC

## 2014-08-22 ENCOUNTER — Encounter: Payer: Self-pay | Admitting: Cardiovascular Disease

## 2014-08-22 ENCOUNTER — Ambulatory Visit (INDEPENDENT_AMBULATORY_CARE_PROVIDER_SITE_OTHER): Payer: Medicare Other | Admitting: Cardiovascular Disease

## 2014-08-22 ENCOUNTER — Ambulatory Visit (INDEPENDENT_AMBULATORY_CARE_PROVIDER_SITE_OTHER): Payer: Medicare Other | Admitting: *Deleted

## 2014-08-22 VITALS — BP 110/70 | HR 88 | Ht 73.0 in | Wt 174.4 lb

## 2014-08-22 DIAGNOSIS — Z7901 Long term (current) use of anticoagulants: Secondary | ICD-10-CM

## 2014-08-22 DIAGNOSIS — Z954 Presence of other heart-valve replacement: Secondary | ICD-10-CM | POA: Diagnosis not present

## 2014-08-22 DIAGNOSIS — Z79899 Other long term (current) drug therapy: Secondary | ICD-10-CM | POA: Diagnosis not present

## 2014-08-22 DIAGNOSIS — I4892 Unspecified atrial flutter: Secondary | ICD-10-CM | POA: Diagnosis not present

## 2014-08-22 DIAGNOSIS — Z953 Presence of xenogenic heart valve: Secondary | ICD-10-CM

## 2014-08-22 DIAGNOSIS — I25119 Atherosclerotic heart disease of native coronary artery with unspecified angina pectoris: Secondary | ICD-10-CM

## 2014-08-22 DIAGNOSIS — I5032 Chronic diastolic (congestive) heart failure: Secondary | ICD-10-CM

## 2014-08-22 LAB — COMPREHENSIVE METABOLIC PANEL
ALK PHOS: 79 U/L (ref 39–117)
ALT: 13 U/L (ref 0–53)
AST: 14 U/L (ref 0–37)
Albumin: 4.2 g/dL (ref 3.5–5.2)
BILIRUBIN TOTAL: 0.8 mg/dL (ref 0.2–1.2)
BUN: 16 mg/dL (ref 6–23)
CHLORIDE: 103 meq/L (ref 96–112)
CO2: 27 mEq/L (ref 19–32)
Calcium: 9.9 mg/dL (ref 8.4–10.5)
Creat: 1.1 mg/dL (ref 0.50–1.35)
Glucose, Bld: 86 mg/dL (ref 70–99)
POTASSIUM: 5 meq/L (ref 3.5–5.3)
Sodium: 138 mEq/L (ref 135–145)
TOTAL PROTEIN: 7.7 g/dL (ref 6.0–8.3)

## 2014-08-22 LAB — POCT INR: INR: 1.8

## 2014-08-22 LAB — MAGNESIUM: MAGNESIUM: 2.4 mg/dL (ref 1.5–2.5)

## 2014-08-22 LAB — TSH: TSH: 1.571 u[IU]/mL (ref 0.350–4.500)

## 2014-08-22 MED ORDER — AMIODARONE HCL 200 MG PO TABS: ORAL_TABLET | ORAL | Status: DC

## 2014-08-22 NOTE — Patient Instructions (Signed)
Your physician has recommended you make the following change in your medication: start new prescription for amiodarone as directed on the bottle. This has already been sent to your [facility] pharmacy.   Your physician recommends that you return for lab work today.  Your physician recommends that you schedule a follow-up appointment in: 4-6 weeks.

## 2014-08-23 ENCOUNTER — Telehealth: Payer: Self-pay | Admitting: Cardiovascular Disease

## 2014-08-23 NOTE — Telephone Encounter (Signed)
It was Dr. Roxy Manns who recommended the amiodarone. If they do not want to take it, then try lopressor 25 mg bid, but less likely to pharmacologically cardiovert

## 2014-08-23 NOTE — Telephone Encounter (Signed)
Called and spoke to wife - she explains that at yesterday's appt, possibility of metoprolol vs amiodarone was discussed for control of A Flutter.  They felt a bit overwhelmed w/ all information at appt. Once they got home and discussed, wanted to see if possible to do the metoprolol - concerned for possible pulm SE's w/ amiodarone.  Informed her I would be happy to route to Dr. Claiborne Billings to determine best advice.

## 2014-08-23 NOTE — Telephone Encounter (Signed)
Extensive discussion w/ wife of patient - ultimately she decided she will call back to inform us of whether pt wants to be on amiodarone or lopressor. Benefits of prompt initiation of medication use discussed in detail.  She will call back.

## 2014-08-23 NOTE — Telephone Encounter (Signed)
Pt's wife called in stating that she would like to speak with Dr. Claiborne Billings about the new medication he prescribed for him; Amiodarone. She says that it has some pulmonary side effects and she is concerned about that considering he has had some pulmonary issues in the past. She would like to discuss other medication options. Please call  Thanks

## 2014-08-24 ENCOUNTER — Encounter: Payer: Self-pay | Admitting: Cardiovascular Disease

## 2014-08-24 ENCOUNTER — Telehealth: Payer: Self-pay | Admitting: *Deleted

## 2014-08-24 NOTE — Progress Notes (Signed)
Patient ID: Erik [name], male   DOB: 11-12-1935, 80 y.o.   MRN: [id number]     HPI: Erik [name] is a 79 y.o. male who presents to the office today for a  follow up cardiology evaluation following his recent aortic valve replacement surgery.  Erik [name] is a 79 year old gentleman who I had seen in cardiology consultation in April while in the hospital after he experienced a brief episode of chest discomfort associated with presyncope and transient visual blurring.  I physical examination suggested a murmur of severe aortic stenosis.  This was verified.  An echo Doppler data, which suggested critical left ear.  I performed a right and left heart catheterization 06/22/2014.  He was found to have critical aortic valve stenosis without significant coronary obstructive disease with only a smooth 20% luminal narrowing of the proximal RCA.  Moderate global LV dysfunction with an EF of 35-40%.  He had upper normal right heart pressures.  He underwent aortic valve replacement surgery by Dr. Roxy Manns.  Using a bioprosthetic tissue valve on 06/30/2014.  Of note, during his hospitalization, he was taken off all beta blockers because of bradycardia.  Prior to seeing Dr. Roxy Manns in the office.  He had a postoperative visit and saw [place] in our office at which time he was noted to be in atrial flutter.  Anticoagulation was recommended.  He was started on warfarin.  Of note, with the patient saw Dr. Roxy Manns .  His heart rate was well controlled and with his history of bradycardia, Dr. Roxy Manns suggested holding off institution of beta blocker therapy, but he did not spontaneously cardiovert to consider initiation of amiodarone.  He presents for follow-up evaluation with me today.  He denies chest pain.  He denies PND, orthopnea.  He denies presyncope or syncope.  Past Medical History  Diagnosis Date  . Heart murmur   . Chest pain 06/19/2014  . Aortic stenosis, severe   . Chronic diastolic congestive heart failure   .  Aortic valve stenosis, severe   . S/P aortic valve replacement with bioprosthetic valve 06/30/2014    25 mm [facility] Ease bovine pericardial tissue valve  . Atrial flutter, post-operative 07/27/2014    Past Surgical History  Procedure Laterality Date  . Appendectomy    . Hand surgery    . Cataract extraction Bilateral ? 2013    & 2014  . Left and right heart catheterization with coronary angiogram N/A 06/22/2014    Procedure: LEFT AND RIGHT HEART CATHETERIZATION WITH CORONARY ANGIOGRAM;  Surgeon: Troy Sine, MD;  Location: [facility] CATH LAB;  Service: Cardiovascular;  Laterality: N/A;  . Aortic valve replacement N/A 06/30/2014    Procedure: AORTIC VALVE REPLACEMENT (AVR);  Surgeon: Rexene Alberts, MD;  Location: [place];  Service: Open Heart Surgery;  Laterality: N/A;  . Intraoperative transesophageal echocardiogram N/A 06/30/2014    Procedure: INTRAOPERATIVE TRANSESOPHAGEAL ECHOCARDIOGRAM;  Surgeon: Rexene Alberts, MD;  Location: [place];  Service: Open Heart Surgery;  Laterality: N/A;    No Known Allergies  Current Outpatient Prescriptions  Medication Sig Dispense Refill  . aspirin 325 MG tablet Take 325 mg by mouth daily.    Marland Kitchen lisinopril (PRINIVIL,ZESTRIL) 5 MG tablet Take 1 tablet (5 mg total) by mouth daily. 30 tablet 3  . warfarin (COUMADIN) 5 MG tablet Take 1 to 1.5 tablets by mouth daily or as directed by coumadin clinic 40 tablet 5  . amiodarone (PACERONE) 200 MG tablet Take 1 tablet daily for 2 weeks,  then increase to 1 tablet twice a day 60 tablet 6   No current facility-administered medications for this visit.    History   Social History  . Marital Status: Married    Spouse Name: N/A  . Number of Children: N/A  . Years of Education: N/A   Occupational History  . Not on file.   Social History Main Topics  . Smoking status: Former Research scientist (life sciences)  . Smokeless tobacco: Never Used     Comment: smoked in college  . Alcohol Use: Not on file     Comment: occ   . Drug Use: No    . Sexual Activity: Not on file   Other Topics Concern  . Not on file   Social History Narrative    Family History  Problem Relation Age of Onset  . Stroke Mother   . Hypertension Mother   . Cancer Father     ROS General: Negative; No fevers, chills, or night sweats HEENT: Negative; No changes in vision or hearing, sinus congestion, difficulty swallowing Pulmonary: Negative; No cough, wheezing, shortness of breath, hemoptysis Cardiovascular: See HPI: No chest pain, presyncope, syncope, palpatations GI: Negative; No nausea, vomiting, diarrhea, or abdominal pain GU: Negative; No dysuria, hematuria, or difficulty voiding Musculoskeletal: Negative; no myalgias, joint pain, or weakness Hematologic: Negative; no easy bruising, bleeding Endocrine: Negative; no heat/cold intolerance; no diabetes, Neuro: Negative; no changes in balance, headaches Skin: Negative; No rashes or skin lesions Psychiatric: Negative; No behavioral problems, depression Sleep: Negative; No snoring,  daytime sleepiness, hypersomnolence, bruxism, restless legs, hypnogognic hallucinations. Other comprehensive 14 point system review is negative   Physical Exam BP 110/70 mmHg  Pulse 88  Ht $R'6\' 1"'Ad$  (1.854 m)  Wt 174 lb 6.4 oz (79.107 kg)  BMI 23.01 kg/m2 Wt Readings from Last 3 Encounters:  08/22/14 174 lb 6.4 oz (79.107 kg)  07/31/14 172 lb (78.019 kg)  07/27/14 175 lb 6.4 oz (79.561 kg)   General: Alert, oriented, no distress.  Skin: normal turgor, no rashes, warm and dry HEENT: Normocephalic, atraumatic. Pupils equal round and reactive to light; sclera anicteric; extraocular muscles intact, No lid lag; Nose without nasal septal hypertrophy; Mouth/Parynx benign; Mallinpatti scale 2 Neck: No JVD, no carotid bruits; normal carotid upstroke Lungs: clear to ausculatation and percussion bilaterally; no wheezing or rales, normal inspiratory and expiratory effort Chest wall: without tenderness to  palpitation Heart: PMI not displaced, RRR, s1 s2 normal, 1/6 systolic murmur, No diastolic murmur, no rubs, gallops, thrills, or heaves Abdomen: soft, nontender; no hepatosplenomehaly, BS+; abdominal aorta nontender and not dilated by palpation. Back: no CVA tenderness Pulses: 2+  Musculoskeletal: full range of motion, normal strength, no joint deformities Extremities: Pulses 2+, no clubbing cyanosis or edema, Homan's sign negative  Neurologic: grossly nonfocal; Cranial nerves grossly wnl Psychologic: Normal mood and affect   ECG (independently read by me): Underlying atrial flutter with a ventricular rate at 88 with frequent PVCs in a bigeminal pattern transiently.  LABS:  BMP Latest Ref Rng 08/22/2014 07/27/2014 07/05/2014  Glucose 70 - 99 mg/dL 86 97 105(H)  BUN 6 - 23 mg/dL $Remove'16 14 16  'YkdUDqc$ Creatinine 0.50 - 1.35 mg/dL 1.10 1.16 1.22  Sodium 135 - 145 mEq/L 138 136 134(L)  Potassium 3.5 - 5.3 mEq/L 5.0 5.1 3.8  Chloride 96 - 112 mEq/L 103 102 99  CO2 19 - 32 mEq/L $Remove'27 23 26  'hEMDebl$ Calcium 8.4 - 10.5 mg/dL 9.9 9.8 8.7     Hepatic Function Latest Ref Rng 08/22/2014 06/29/2014  06/23/2014  Total Protein 6.0 - 8.3 g/dL 7.7 7.1 6.3  Albumin 3.5 - 5.2 g/dL 4.2 3.9 3.2(L)  AST 0 - 37 U/L $Remo'14 16 15  'HvYhk$ ALT 0 - 53 U/L $Remo'13 12 14  'qpaca$ Alk Phosphatase 39 - 117 U/L 79 58 57  Total Bilirubin 0.2 - 1.2 mg/dL 0.8 1.0 1.7(H)    CBC Latest Ref Rng 07/05/2014 07/03/2014 07/02/2014  WBC 4.0 - 10.5 K/uL 5.9 6.9 10.4  Hemoglobin 13.0 - 17.0 g/dL 8.8(L) 9.0(L) 9.5(L)  Hematocrit 39.0 - 52.0 % 25.2(L) 25.9(L) 27.8(L)  Platelets 150 - 400 K/uL 206 122(L) 132(L)   Lab Results  Component Value Date   MCV 102.0* 07/05/2014   MCV 103.6* 07/03/2014   MCV 101.8* 07/02/2014    Lab Results  Component Value Date   TSH 1.571 08/22/2014    BNP    Component Value Date/Time   BNP 1282.9* 06/19/2014 1630    ProBNP No results found for: PROBNP   Lipid Panel     Component Value Date/Time   CHOL 135 06/19/2014 2134   TRIG  60 06/19/2014 2134   HDL 35* 06/19/2014 2134   CHOLHDL 3.9 06/19/2014 2134   VLDL 12 06/19/2014 2134   LDLCALC 88 06/19/2014 2134     RADIOLOGY: Dg Chest 2 View  07/31/2014   CLINICAL DATA:  Status post aortic valve replacement on June 30, 2014, currently asymptomatic; history of previous tobacco use.  EXAM: CHEST  2 VIEW  COMPARISON:  PA and lateral chest of July 14, 2014  FINDINGS: The right lung well-expanded and clear. On the left there is persistent mild volume loss with small pleural effusion unchanged since the previous exam. The heart is top normal in size. The pulmonary vascularity is normal. There are 8 intact sternal wires. The prosthetic aortic valve ring is visible.  IMPRESSION: Stable volume loss on the left consistent with pleural effusion and basilar atelectasis. There is no pulmonary edema.   Electronically Signed   By: David  Martinique M.D.   On: 07/31/2014 11:04      ASSESSMENT AND PLAN: Erik [name] is a 79 year old gentleman who was recently found to have severe symptomatic critical aortic valve stenosis after he presented to the hospital following a presyncopal spell.  He successfully underwent aortic valve replacement surgery on 06/30/2014.  He is now on Coumadin therapy.  His INR today was subtherapeutic at 1.8 and adjustments were made to his regimen.  I long discussion with both he and his wife concerning his atrial flutter.  I discussed options including beta blocker therapy versus initiation of amiodarone.  Per Dr. Guy Sandifer recent evaluation, I have recommended initiation of amiodarone 20 mg daily for 2 weeks and if he tolerates this to then increase this to 200 mg twice a day.  Hopefully this will successfully pharmacologically cardiovert him back to sinus rhythm.  He will require weekly pro times particular due to the amiodarone-Coumadin interaction.  He has lost approximately 30 pound since his hospitalization.  I am also recommending he decrease his aspirin to 81 mg.   Follow-up blood work will be obtained consisting of a CMP, TSH and magnesium level.  I will see him in 4 weeks for reevaluation.  Time spent: 30 minutes  Troy Sine, MD, [facility]  08/24/2014 7:00 PM

## 2014-08-24 NOTE — Telephone Encounter (Signed)
Mr. Erik [name] had been ordered Metoprolol on a subsequent visit after his valve surgery for his atrial flutter by Dr. Claiborne Billings but never started the medication.  Dr. Roxy Manns had expressed his agreement with the initiation of this med and even to consider Amiodarone if he did not convert. Mrs. Erik [name] called to say they are very concerned with the side effects of the amiodarone and should he start the metoprolol due to his slow heart rate and low blood pressure at times. I discussed this with Dr. Roxy Manns. He highly encouraged Mr. Erik [name] to begin the metoprolol as instructed before and she agreed.  She should communicate with Dr. Evette Georges office for continued concerns regarding these meds.

## 2014-08-25 NOTE — Telephone Encounter (Signed)
Pt is returning Nathan's phone call

## 2014-08-25 NOTE — Telephone Encounter (Signed)
Pt updated me, taking the metoprolol rx written by Dr. Claiborne Billings.  Med list updated.

## 2014-08-30 ENCOUNTER — Ambulatory Visit (INDEPENDENT_AMBULATORY_CARE_PROVIDER_SITE_OTHER): Payer: Medicare Other | Admitting: Pharmacist Clinician (PhC)/ Clinical Pharmacy Specialist

## 2014-08-30 VITALS — BP 110/68 | HR 42

## 2014-08-30 DIAGNOSIS — Z954 Presence of other heart-valve replacement: Secondary | ICD-10-CM

## 2014-08-30 DIAGNOSIS — Z953 Presence of xenogenic heart valve: Secondary | ICD-10-CM

## 2014-08-30 DIAGNOSIS — Z7901 Long term (current) use of anticoagulants: Secondary | ICD-10-CM | POA: Diagnosis not present

## 2014-08-30 LAB — POCT INR: INR: 2.4

## 2014-09-06 ENCOUNTER — Ambulatory Visit (INDEPENDENT_AMBULATORY_CARE_PROVIDER_SITE_OTHER): Payer: Medicare Other | Admitting: Pharmacist

## 2014-09-06 ENCOUNTER — Telehealth: Payer: Self-pay | Admitting: *Deleted

## 2014-09-06 DIAGNOSIS — Z954 Presence of other heart-valve replacement: Secondary | ICD-10-CM

## 2014-09-06 DIAGNOSIS — Z953 Presence of xenogenic heart valve: Secondary | ICD-10-CM

## 2014-09-06 DIAGNOSIS — Z7901 Long term (current) use of anticoagulants: Secondary | ICD-10-CM | POA: Diagnosis not present

## 2014-09-06 LAB — POCT INR: INR: 2.3

## 2014-09-06 NOTE — Telephone Encounter (Signed)
Returned cardiac rehab order to [facility]. 

## 2014-09-11 ENCOUNTER — Encounter: Payer: Self-pay | Admitting: Cardiovascular Disease

## 2014-09-11 ENCOUNTER — Ambulatory Visit (INDEPENDENT_AMBULATORY_CARE_PROVIDER_SITE_OTHER): Payer: Medicare Other | Admitting: Cardiovascular Disease

## 2014-09-11 ENCOUNTER — Other Ambulatory Visit: Payer: Self-pay

## 2014-09-11 VITALS — BP 132/64 | HR 67 | Ht 73.0 in | Wt 177.0 lb

## 2014-09-11 DIAGNOSIS — Z954 Presence of other heart-valve replacement: Secondary | ICD-10-CM

## 2014-09-11 DIAGNOSIS — Z953 Presence of xenogenic heart valve: Secondary | ICD-10-CM

## 2014-09-11 DIAGNOSIS — I4892 Unspecified atrial flutter: Secondary | ICD-10-CM | POA: Diagnosis not present

## 2014-09-11 DIAGNOSIS — Z7901 Long term (current) use of anticoagulants: Secondary | ICD-10-CM | POA: Diagnosis not present

## 2014-09-11 DIAGNOSIS — I25119 Atherosclerotic heart disease of native coronary artery with unspecified angina pectoris: Secondary | ICD-10-CM

## 2014-09-11 DIAGNOSIS — D689 Coagulation defect, unspecified: Secondary | ICD-10-CM | POA: Diagnosis not present

## 2014-09-11 NOTE — Progress Notes (Signed)
Patient ID: Erik [name], male   DOB: 10/03/1935, 78 y.o.   MRN: [id number]     HPI: Erik [name] is a 78 y.o. male who presents to the office today for a  follow up cardiology evaluation following his recent aortic valve replacement surgery and development of atrial flutter.  I had seen Erik [name] in cardiology consultation in April 2016 while in the hospital after he experienced a brief episode of chest discomfort associated with presyncope and transient visual blurring.  Physical examination suggested a murmur of severe aortic stenosis.  This was verified by echo Doppler data, which suggested critical AS.  I performed a right and left heart catheterization 06/22/2014.  He was found to have critical aortic valve stenosis without significant coronary obstructive disease with only a smooth 20% luminal narrowing of the proximal RCA and moderate global LV dysfunction with an EF of 35-40%.  He had upper normal right heart pressures.  He underwent aortic valve replacement surgery by Dr. Owen using a bioprosthetic tissue valve on 06/30/2014.  Of note, during his hospitalization, he was taken off all beta blockers because of bradycardia.  Prior to seeing Dr. Owen in the office he had a postoperative visit and saw Brittany Simmonds in our office at which time he was noted to be in atrial flutter.  Anticoagulation was recommended.  He was started on warfarin.  Of note, with the patient saw Dr. Owen . bradycardia, Dr. Owen suggested holding off institution of beta blocker therapy, but if he did not spontaneously cardiovert to consider initiation of amiodarone.  I saw for initial evaluation on 08/22/2014.  At that time, he was still in atrial flutter and I recommended institution of low-dose amiodarone therapy.  The patient went home and after further consideration, particularly with his 40 year history of chemical exposure to his lungs with work and concern of lung disease.  He did not want to institute amiodarone and  preferred beta blocker therapy.  I started him on metoprolol 12.5 mg twice a day.  He has been on Coumadin anticoagulation for slightly over 1 month presents for follow up evaluation today.  He denies any episodes of chest pain.  He denies bleeding.  His INR last week was 2.3.  He denies PND, orthopnea.  He is unaware of any heart rhythm irregularity.   Past Medical History  Diagnosis Date  . Heart murmur   . Chest pain 06/19/2014  . Aortic stenosis, severe   . Chronic diastolic congestive heart failure   . Aortic valve stenosis, severe   . S/P aortic valve replacement with bioprosthetic valve 06/30/2014    25 mm [facility] Ease bovine pericardial tissue valve  . Atrial flutter, post-operative 07/27/2014    Past Surgical History  Procedure Laterality Date  . Appendectomy    . Hand surgery    . Cataract extraction Bilateral ? 2013    & 2014  . Left and right heart catheterization with coronary angiogram N/A 06/22/2014    Procedure: LEFT AND RIGHT HEART CATHETERIZATION WITH CORONARY ANGIOGRAM;  Surgeon: Thomas A Kelly, MD;  Location: [facility] CATH LAB;  Service: Cardiovascular;  Laterality: N/A;  . Aortic valve replacement N/A 06/30/2014    Procedure: AORTIC VALVE REPLACEMENT (AVR);  Surgeon: Clarence H Owen, MD;  Location: [facility] OR;  Service: Open Heart Surgery;  Laterality: N/A;  . Intraoperative transesophageal echocardiogram N/A 06/30/2014    Procedure: INTRAOPERATIVE TRANSESOPHAGEAL ECHOCARDIOGRAM;  Surgeon: Clarence H Owen, MD;  Location: [facility] OR;  Service: Open Heart   Surgery;  Laterality: N/A;    No Known Allergies  Current Outpatient Prescriptions  Medication Sig Dispense Refill  . aspirin 325 MG tablet Take 325 mg by mouth daily.    . lisinopril (PRINIVIL,ZESTRIL) 5 MG tablet Take 1 tablet (5 mg total) by mouth daily. 30 tablet 3  . metoprolol tartrate (LOPRESSOR) 25 MG tablet Take by mouth 2 (two) times daily. Take 1 tablet in the morning and 1/2 tablet in the evening    . warfarin  (COUMADIN) 5 MG tablet Take 1 to 1.5 tablets by mouth daily or as directed by coumadin clinic 40 tablet 5   No current facility-administered medications for this visit.    History   Social History  . Marital Status: Married    Spouse Name: N/A  . Number of Children: N/A  . Years of Education: N/A   Occupational History  . Not on file.   Social History Main Topics  . Smoking status: Former Smoker  . Smokeless tobacco: Never Used     Comment: smoked in college  . Alcohol Use: Not on file     Comment: occ   . Drug Use: No  . Sexual Activity: Not on file   Other Topics Concern  . Not on file   Social History Narrative    Family History  Problem Relation Age of Onset  . Stroke Mother   . Hypertension Mother   . Cancer Father     ROS General: Negative; No fevers, chills, or night sweats HEENT: Negative; No changes in vision or hearing, sinus congestion, difficulty swallowing Pulmonary: Negative; No cough, wheezing, shortness of breath, hemoptysis Cardiovascular: See HPI:  GI: Negative; No nausea, vomiting, diarrhea, or abdominal pain GU: Negative; No dysuria, hematuria, or difficulty voiding Musculoskeletal: Negative; no myalgias, joint pain, or weakness Hematologic: Negative; no easy bruising, bleeding Endocrine: Negative; no heat/cold intolerance; no diabetes, Neuro: Negative; no changes in balance, headaches Skin: Negative; No rashes or skin lesions Psychiatric: Negative; No behavioral problems, depression Sleep: Negative; No snoring,  daytime sleepiness, hypersomnolence, bruxism, restless legs, hypnogognic hallucinations. Other comprehensive 14 point system review is negative   Physical Exam BP 132/64 mmHg  Pulse 67  Ht 6' 1" (1.854 m)  Wt 177 lb (80.287 kg)  BMI 23.36 kg/m2 Wt Readings from Last 3 Encounters:  09/11/14 177 lb (80.287 kg)  08/22/14 174 lb 6.4 oz (79.107 kg)  07/31/14 172 lb (78.019 kg)   General: Alert, oriented, no distress.  Skin:  normal turgor, no rashes, warm and dry HEENT: Normocephalic, atraumatic. Pupils equal round and reactive to light; sclera anicteric; extraocular muscles intact, No lid lag; Nose without nasal septal hypertrophy; Mouth/Parynx benign; Mallinpatti scale 2 Neck: No JVD, no carotid bruits; normal carotid upstroke Lungs: clear to ausculatation and percussion bilaterally; no wheezing or rales, normal inspiratory and expiratory effort Chest wall: without tenderness to palpitation Heart: PMI not displaced, RRR, s1 s2 normal, 1/6 systolic murmur, No diastolic murmur, no rubs, gallops, thrills, or heaves Abdomen: soft, nontender; no hepatosplenomehaly, BS+; abdominal aorta nontender and not dilated by palpation. Back: no CVA tenderness Pulses: 2+  Musculoskeletal: full range of motion, normal strength, no joint deformities Extremities: Pulses 2+, no clubbing cyanosis or edema, Homan's sign negative  Neurologic: grossly nonfocal; Cranial nerves grossly wnl Psychologic: Normal mood and affect  ECG (independently read by me): Atrial flutter with 41 block.  One isolated PVC.  Ventricular rate 67 bpm.  Nonspecific ST-T change   ECG (independently read by me): Underlying atrial   flutter with a ventricular rate at 88 with frequent PVCs in a bigeminal pattern transiently.  LABS:  BMP Latest Ref Rng 08/22/2014 07/27/2014 07/05/2014  Glucose 70 - 99 mg/dL 86 97 105(H)  BUN 6 - 23 mg/dL 16 14 16  Creatinine 0.50 - 1.35 mg/dL 1.10 1.16 1.22  Sodium 135 - 145 mEq/L 138 136 134(L)  Potassium 3.5 - 5.3 mEq/L 5.0 5.1 3.8  Chloride 96 - 112 mEq/L 103 102 99  CO2 19 - 32 mEq/L 27 23 26  Calcium 8.4 - 10.5 mg/dL 9.9 9.8 8.7     Hepatic Function Latest Ref Rng 08/22/2014 06/29/2014 06/23/2014  Total Protein 6.0 - 8.3 g/dL 7.7 7.1 6.3  Albumin 3.5 - 5.2 g/dL 4.2 3.9 3.2(L)  AST 0 - 37 U/L 14 16 15  ALT 0 - 53 U/L 13 12 14  Alk Phosphatase 39 - 117 U/L 79 58 57  Total Bilirubin 0.2 - 1.2 mg/dL 0.8 1.0 1.7(H)    CBC  Latest Ref Rng 07/05/2014 07/03/2014 07/02/2014  WBC 4.0 - 10.5 K/uL 5.9 6.9 10.4  Hemoglobin 13.0 - 17.0 g/dL 8.8(L) 9.0(L) 9.5(L)  Hematocrit 39.0 - 52.0 % 25.2(L) 25.9(L) 27.8(L)  Platelets 150 - 400 K/uL 206 122(L) 132(L)   Lab Results  Component Value Date   MCV 102.0* 07/05/2014   MCV 103.6* 07/03/2014   MCV 101.8* 07/02/2014    Lab Results  Component Value Date   TSH 1.571 08/22/2014    BNP    Component Value Date/Time   BNP 1282.9* 06/19/2014 1630    ProBNP No results found for: PROBNP   Lipid Panel     Component Value Date/Time   CHOL 135 06/19/2014 2134   TRIG 60 06/19/2014 2134   HDL 35* 06/19/2014 2134   CHOLHDL 3.9 06/19/2014 2134   VLDL 12 06/19/2014 2134   LDLCALC 88 06/19/2014 2134     RADIOLOGY: No results found.    ASSESSMENT AND PLAN: Erik [name] is a 78-year-old gentleman who was found to have severe symptomatic critical aortic valve stenosis after he presented to the hospital following a presyncopal spell.  He successfully underwent aortic valve replacement surgery on 06/30/2014.  He is now on Coumadin therapy.  His INR last week was therapeutic and he has been on Coumadin for slightly over a month.  At his last office visit.  I discussed initiation of low-dose amiodarone, but ultimately he did not want to pursue this.  3.  Concerns for lung toxicity with his prior forty-year chemical exposure from work.  I instituted very low-dose beta blocker therapy.  He seems to have tolerated this without significant bradycardia arrhythmia.  Just sitting now try increasing this to 25 mm in the morning but to continue to take 12.5 Milgram's at night.  If his pulse gets below 50.  He will resume to 12.5 twice a day dosing.  I discussed attempt at cardioversion for restoration of sinus rhythm.  We will try to schedule this to be done the week after the Fourth of July and hopefully he will be successfully cardioverted and will not need antiarrhythmics treatment.   If recurrent atrial flutter develops he may be a candidate for atrial flutter ablation or anti-arrythmic therapy will need to be initiated.  Laboratory will be checked prior to his cardioversion.  I set he also comes to the office 2 days before the scheduled date to make certain he is still in atrial flutter and has not converted.  Time spent: 25   minutes  Thomas A. Kelly, MD, FACC  09/11/2014 5:38 PM    

## 2014-09-11 NOTE — Patient Instructions (Addendum)
Your physician has recommended that you have a Cardioversion (DCCV). Electrical Cardioversion uses a jolt of electricity to your heart either through paddles or wired patches attached to your chest. This is a controlled, usually prescheduled, procedure. Defibrillation is done under light anesthesia in the hospital, and you usually go home the day of the procedure. This is done to get your heart back into a normal rhythm. You are not awake for the procedure. Please see the instruction sheet given to you today. This will be done the week of July 11th with Dr. Claiborne Billings.  Your physician recommends that you return for lab work within 7 days of the procedure.  Your physician has recommended you make the following change in your medication: increase the lopressor to 25 mg in the morning and 12.5 mg in the evening. If your pulse drops below 50 beats per minute then go to 1/2 tablet of a 25 mg tablet twice a day.  Your physician recommends that you schedule a follow-up appointment in: 2 weeks following the procedure. With a extender. (PA or nurse practioner.)  Your physician recommends that you schedule a follow-up appointment in: 2 [place] EKG.

## 2014-09-12 ENCOUNTER — Encounter: Payer: Self-pay | Admitting: Cardiovascular Disease

## 2014-09-13 ENCOUNTER — Other Ambulatory Visit: Payer: Self-pay | Admitting: *Deleted

## 2014-09-13 DIAGNOSIS — Z01818 Encounter for other preprocedural examination: Secondary | ICD-10-CM

## 2014-09-14 ENCOUNTER — Ambulatory Visit (HOSPITAL_COMMUNITY): Payer: Medicare Other

## 2014-09-15 ENCOUNTER — Telehealth: Payer: Self-pay | Admitting: *Deleted

## 2014-09-15 NOTE — Telephone Encounter (Signed)
Faxed cardiac rehab phase ll order to cardiac rehab.

## 2014-09-20 ENCOUNTER — Ambulatory Visit (INDEPENDENT_AMBULATORY_CARE_PROVIDER_SITE_OTHER): Payer: Medicare Other | Admitting: Pharmacist Clinician (PhC)/ Clinical Pharmacy Specialist

## 2014-09-20 ENCOUNTER — Ambulatory Visit (HOSPITAL_COMMUNITY): Payer: Medicare Other

## 2014-09-20 ENCOUNTER — Ambulatory Visit: Payer: Medicare Other | Admitting: Pharmacist Clinician (PhC)/ Clinical Pharmacy Specialist

## 2014-09-20 DIAGNOSIS — Z7901 Long term (current) use of anticoagulants: Secondary | ICD-10-CM | POA: Diagnosis not present

## 2014-09-20 DIAGNOSIS — Z954 Presence of other heart-valve replacement: Secondary | ICD-10-CM

## 2014-09-20 DIAGNOSIS — Z953 Presence of xenogenic heart valve: Secondary | ICD-10-CM

## 2014-09-20 LAB — POCT INR: INR: 2.2

## 2014-09-22 ENCOUNTER — Ambulatory Visit (HOSPITAL_COMMUNITY): Payer: Medicare Other

## 2014-09-25 ENCOUNTER — Ambulatory Visit (HOSPITAL_COMMUNITY): Payer: Medicare Other

## 2014-09-27 ENCOUNTER — Ambulatory Visit (HOSPITAL_COMMUNITY): Payer: Medicare Other

## 2014-09-27 ENCOUNTER — Ambulatory Visit (INDEPENDENT_AMBULATORY_CARE_PROVIDER_SITE_OTHER): Payer: Medicare Other | Admitting: Pharmacist Clinician (PhC)/ Clinical Pharmacy Specialist

## 2014-09-27 DIAGNOSIS — Z954 Presence of other heart-valve replacement: Secondary | ICD-10-CM | POA: Diagnosis not present

## 2014-09-27 DIAGNOSIS — Z7901 Long term (current) use of anticoagulants: Secondary | ICD-10-CM | POA: Diagnosis not present

## 2014-09-27 DIAGNOSIS — Z953 Presence of xenogenic heart valve: Secondary | ICD-10-CM

## 2014-09-27 LAB — POCT INR: INR: 2.2

## 2014-09-29 ENCOUNTER — Ambulatory Visit (HOSPITAL_COMMUNITY): Payer: Medicare Other

## 2014-10-02 ENCOUNTER — Ambulatory Visit (HOSPITAL_COMMUNITY): Payer: Medicare Other

## 2014-10-02 ENCOUNTER — Ambulatory Visit: Payer: Medicare Other | Admitting: Thoracic Surgery (Cardiothoracic Vascular Surgery)

## 2014-10-04 ENCOUNTER — Encounter: Payer: Self-pay | Admitting: Thoracic Surgery (Cardiothoracic Vascular Surgery)

## 2014-10-04 ENCOUNTER — Ambulatory Visit (INDEPENDENT_AMBULATORY_CARE_PROVIDER_SITE_OTHER): Payer: Medicare Other | Admitting: Thoracic Surgery (Cardiothoracic Vascular Surgery)

## 2014-10-04 ENCOUNTER — Ambulatory Visit (HOSPITAL_COMMUNITY): Payer: Medicare Other

## 2014-10-04 VITALS — BP 127/70 | HR 65 | Resp 20 | Ht 73.0 in | Wt 177.0 lb

## 2014-10-04 DIAGNOSIS — Z953 Presence of xenogenic heart valve: Secondary | ICD-10-CM

## 2014-10-04 DIAGNOSIS — Z954 Presence of other heart-valve replacement: Secondary | ICD-10-CM | POA: Diagnosis not present

## 2014-10-04 DIAGNOSIS — I350 Nonrheumatic aortic (valve) stenosis: Secondary | ICD-10-CM

## 2014-10-04 DIAGNOSIS — I4892 Unspecified atrial flutter: Secondary | ICD-10-CM | POA: Diagnosis not present

## 2014-10-04 DIAGNOSIS — I25119 Atherosclerotic heart disease of native coronary artery with unspecified angina pectoris: Secondary | ICD-10-CM | POA: Diagnosis not present

## 2014-10-04 NOTE — Progress Notes (Signed)
[address]       [place] [address]             [facility] OFFICE NOTE  Referring Provider is Troy Sine, MD PCP is Jani Gravel, MD   HPI:  Patient returns to the office today for routine follow up status post aortic valve replacement using a bioprosthetic tissue valve on 06/30/2014 for severe symptomatic aortic stenosis. His early postoperative recovery in the hospital was uncomplicated although notable for sinus bradycardia for which beta blocker therapy was held.  Clinically the patient has continued to recover uneventfully except for the fact that he developed atrial flutter that has persisted. The patient has been anticoagulated using warfarin and treated with metoprolol. Initially he was offered amiodarone therapy, but the patient declined. He has been seen in follow-up recently by Dr. Claiborne Billings at which time he remained in rate controlled atrial flutter. Elective DC cardioversion has been planned for next week. The patient returns to our office today reporting that he feels quite well. He no longer has any pain in his chest. He has no shortness of breath and his exercise tolerance is actually fairly good. He is not having any palpitations or dizzy spells. He has had some problems adjusting his dose of metoprolol, but it sounds as though that has finally gotten straightened out. Overall he has no complaints.   Current Outpatient Prescriptions  Medication Sig Dispense Refill  . aspirin EC 81 MG tablet Take 81 mg by mouth daily.    Marland Kitchen lisinopril (PRINIVIL,ZESTRIL) 5 MG tablet Take 1 tablet (5 mg total) by mouth daily. 30 tablet 3  . metoprolol tartrate (LOPRESSOR) 25 MG tablet Take 12.5 mg by mouth daily. 12.5 mg daily    . warfarin (COUMADIN) 5 MG tablet Take 1 to 1.5 tablets by mouth daily or as directed by coumadin clinic 40 tablet 5   No current facility-administered medications for this visit.      Physical Exam:   BP  127/70 mmHg  Pulse 65  Resp 20  Ht 6\' 1"  (1.854 m)  Wt 177 lb (80.287 kg)  BMI 23.36 kg/m2  SpO2 98%  General:  Well-appearing  Chest:   clear  CV:   Regular rate and rhythm  Incisions:  Well healed, sternum is stable  Abdomen:  Soft and nontender  Extremities:  Warm and well-perfused  Diagnostic Tests:  2 channel telemetry rhythm strip demonstrates atrial flutter with heart rate 60 bpm   Impression:  The patient is clinically doing very well approximately 3 months status post aortic valve replacement using a bioprosthetic tissue valve. However, he remains in persistent rate controlled atrial flutter. He has been anticoagulated using warfarin and his most recent INR was 2.2 last week. He has been scheduled for elective DC cardioversion. He has not had a routine follow-up echocardiograms since surgery.  Plan:  I agree with plans to proceed with cardioversion as previously outlined by Dr. Claiborne Billings.  At some point the patient should undergo routine follow up echocardiogram.  From a surgical standpoint the patient is now more than 3 months out and may resume unrestricted physical activity once he has been cleared from the standpoint of his persistent atrial flutter. We have not recommended any changes to his current medications. The patient has been reminded regarding the need for life long anti-biotic prophylaxis for all dental cleaning and related procedures. The patient will return for routine follow-up next April  proximally 1 year following his original surgery. All of his questions have been addressed.  I spent in excess of 15 minutes during the conduct of this office consultation and >50% of this time involved direct face-to-face encounter with the patient for counseling and/or coordination of their care.    Valentina Gu. Roxy Manns, MD 10/04/2014 1:58 PM

## 2014-10-04 NOTE — Patient Instructions (Signed)
The patient should continue all previous medications without changes at this time   Endocarditis is a potentially serious infection of heart valves or inside lining of the heart.  It occurs more commonly in patients with diseased heart valves (such as patient's with aortic or mitral valve disease) and in patients who have undergone heart valve repair or replacement.  Certain surgical and dental procedures may put you at risk, such as dental cleaning, other dental procedures, or any surgery involving the respiratory, urinary, gastrointestinal tract, gallbladder or prostate gland.   To minimize your chances for develooping endocarditis, maintain good oral health and seek prompt medical attention for any infections involving the mouth, teeth, gums, skin or urinary tract.  Always notify your doctor or dentist about your underlying heart valve condition before having any invasive procedures. You will need to take antibiotics before certain procedures.

## 2014-10-05 ENCOUNTER — Ambulatory Visit: Payer: Medicare Other | Admitting: Cardiovascular Disease

## 2014-10-06 ENCOUNTER — Ambulatory Visit: Payer: Medicare Other | Admitting: Thoracic Surgery (Cardiothoracic Vascular Surgery)

## 2014-10-06 ENCOUNTER — Ambulatory Visit (INDEPENDENT_AMBULATORY_CARE_PROVIDER_SITE_OTHER): Payer: Medicare Other

## 2014-10-06 ENCOUNTER — Ambulatory Visit (INDEPENDENT_AMBULATORY_CARE_PROVIDER_SITE_OTHER): Payer: Medicare Other | Admitting: Pharmacist Clinician (PhC)/ Clinical Pharmacy Specialist

## 2014-10-06 ENCOUNTER — Ambulatory Visit (HOSPITAL_COMMUNITY): Payer: Medicare Other

## 2014-10-06 VITALS — BP 96/62 | HR 64 | Ht 73.0 in | Wt 177.0 lb

## 2014-10-06 DIAGNOSIS — I4892 Unspecified atrial flutter: Secondary | ICD-10-CM | POA: Diagnosis not present

## 2014-10-06 DIAGNOSIS — Z954 Presence of other heart-valve replacement: Secondary | ICD-10-CM | POA: Diagnosis not present

## 2014-10-06 DIAGNOSIS — Z953 Presence of xenogenic heart valve: Secondary | ICD-10-CM

## 2014-10-06 DIAGNOSIS — Z7901 Long term (current) use of anticoagulants: Secondary | ICD-10-CM | POA: Diagnosis not present

## 2014-10-06 DIAGNOSIS — D689 Coagulation defect, unspecified: Secondary | ICD-10-CM | POA: Diagnosis not present

## 2014-10-06 DIAGNOSIS — Z79899 Other long term (current) drug therapy: Secondary | ICD-10-CM | POA: Diagnosis not present

## 2014-10-06 LAB — APTT: aPTT: 51 seconds — ABNORMAL HIGH (ref 24–37)

## 2014-10-06 LAB — CBC
HEMATOCRIT: 37.5 % — AB (ref 39.0–52.0)
Hemoglobin: 13.3 g/dL (ref 13.0–17.0)
MCH: 33.8 pg (ref 26.0–34.0)
MCHC: 35.5 g/dL (ref 30.0–36.0)
MCV: 95.2 fL (ref 78.0–100.0)
MPV: 10.7 fL (ref 8.6–12.4)
PLATELETS: 278 10*3/uL (ref 150–400)
RBC: 3.94 MIL/uL — AB (ref 4.22–5.81)
RDW: 14.7 % (ref 11.5–15.5)
WBC: 5.4 10*3/uL (ref 4.0–10.5)

## 2014-10-06 LAB — POCT INR: INR: 2.3

## 2014-10-06 LAB — PROTIME-INR
INR: 2.18 — AB (ref <1.50)
Prothrombin Time: 24.3 seconds — ABNORMAL HIGH (ref 11.6–15.2)

## 2014-10-06 NOTE — Progress Notes (Signed)
1.) Reason for visit: EKG  2.) Name of MD requesting visit: Dr.Kelly   3.) H&P: Scheduled for cardioversion 10/09/14  4.) ROS related to problem: No complaints. EKG revealed atrial flutter  5.) Assessment and plan per MD: Keep cardioversion appointment as planned 10/09/14

## 2014-10-06 NOTE — Patient Instructions (Signed)
Continue same medications   Keep cardioversion appointment as planned 10/09/14

## 2014-10-09 ENCOUNTER — Ambulatory Visit (HOSPITAL_COMMUNITY)
Admission: RE | Admit: 2014-10-09 | Discharge: 2014-10-09 | Disposition: A | Discharge status: Home / Self Care | Payer: Medicare Other | Source: Ambulatory Visit | Attending: Cardiovascular Disease | Admitting: Cardiovascular Disease

## 2014-10-09 ENCOUNTER — Encounter (HOSPITAL_COMMUNITY)
Admission: RE | Disposition: A | Discharge status: Home / Self Care | Payer: Self-pay | Source: Ambulatory Visit | Attending: Cardiovascular Disease

## 2014-10-09 ENCOUNTER — Ambulatory Visit (HOSPITAL_COMMUNITY): Payer: Medicare Other

## 2014-10-09 ENCOUNTER — Encounter (HOSPITAL_COMMUNITY): Payer: Self-pay | Admitting: Anesthesiology

## 2014-10-09 ENCOUNTER — Ambulatory Visit (HOSPITAL_COMMUNITY): Payer: Medicare Other | Admitting: Anesthesiology

## 2014-10-09 DIAGNOSIS — Z952 Presence of prosthetic heart valve: Secondary | ICD-10-CM | POA: Diagnosis not present

## 2014-10-09 DIAGNOSIS — I483 Typical atrial flutter: Secondary | ICD-10-CM | POA: Insufficient documentation

## 2014-10-09 DIAGNOSIS — I4581 Long QT syndrome: Secondary | ICD-10-CM | POA: Insufficient documentation

## 2014-10-09 DIAGNOSIS — I4891 Unspecified atrial fibrillation: Secondary | ICD-10-CM | POA: Insufficient documentation

## 2014-10-09 DIAGNOSIS — Z01818 Encounter for other preprocedural examination: Secondary | ICD-10-CM

## 2014-10-09 DIAGNOSIS — Z87891 Personal history of nicotine dependence: Secondary | ICD-10-CM | POA: Diagnosis not present

## 2014-10-09 DIAGNOSIS — I5032 Chronic diastolic (congestive) heart failure: Secondary | ICD-10-CM | POA: Diagnosis not present

## 2014-10-09 DIAGNOSIS — I491 Atrial premature depolarization: Secondary | ICD-10-CM | POA: Insufficient documentation

## 2014-10-09 DIAGNOSIS — I4892 Unspecified atrial flutter: Secondary | ICD-10-CM | POA: Diagnosis not present

## 2014-10-09 DIAGNOSIS — I440 Atrioventricular block, first degree: Secondary | ICD-10-CM | POA: Insufficient documentation

## 2014-10-09 DIAGNOSIS — Z7982 Long term (current) use of aspirin: Secondary | ICD-10-CM | POA: Insufficient documentation

## 2014-10-09 DIAGNOSIS — Z79899 Other long term (current) drug therapy: Secondary | ICD-10-CM | POA: Diagnosis not present

## 2014-10-09 DIAGNOSIS — Z7901 Long term (current) use of anticoagulants: Secondary | ICD-10-CM | POA: Diagnosis not present

## 2014-10-09 HISTORY — PX: CARDIOVERSION: SHX1299

## 2014-10-09 LAB — BASIC METABOLIC PANEL
BUN: 17 mg/dL (ref 7–25)
CHLORIDE: 105 meq/L (ref 98–110)
CO2: 25 meq/L (ref 20–31)
Calcium: 9.7 mg/dL (ref 8.6–10.3)
Creat: 1.15 mg/dL (ref 0.70–1.18)
Glucose, Bld: 90 mg/dL (ref 65–99)
POTASSIUM: 5 meq/L (ref 3.5–5.3)
SODIUM: 140 meq/L (ref 135–146)

## 2014-10-09 LAB — TSH: TSH: 2.025 u[IU]/mL (ref 0.350–4.500)

## 2014-10-09 SURGERY — CARDIOVERSION
Anesthesia: General

## 2014-10-09 MED ORDER — EPHEDRINE SULFATE 50 MG/ML IJ SOLN
INTRAMUSCULAR | Status: DC | PRN
  Administered 2014-10-09: 15 mg via INTRAVENOUS

## 2014-10-09 MED ORDER — PROPOFOL 10 MG/ML IV BOLUS
INTRAVENOUS | Status: DC | PRN
  Administered 2014-10-09: 80 mg via INTRAVENOUS

## 2014-10-09 MED ORDER — SODIUM CHLORIDE 0.9 % IJ SOLN: 3.0000 mL | Freq: Two times a day (BID) | INTRAMUSCULAR | Status: DC

## 2014-10-09 MED ORDER — LIDOCAINE HCL (CARDIAC) 20 MG/ML IV SOLN
INTRAVENOUS | Status: DC | PRN
  Administered 2014-10-09: 100 mg via INTRAVENOUS

## 2014-10-09 MED ORDER — SODIUM CHLORIDE 0.9 % IV SOLN: 250.0000 mL | INTRAVENOUS | Status: DC

## 2014-10-09 MED ORDER — SODIUM CHLORIDE 0.9 % IJ SOLN: 3.0000 mL | INTRAMUSCULAR | Status: DC | PRN

## 2014-10-09 MED ORDER — SODIUM CHLORIDE 0.9 % IV SOLN
INTRAVENOUS | Status: DC
  Administered 2014-10-09 (×2): via INTRAVENOUS

## 2014-10-09 MED ORDER — HYDROCORTISONE 1 % EX CREA: 1.0000 "application " | TOPICAL_CREAM | Freq: Three times a day (TID) | CUTANEOUS | Status: DC | PRN

## 2014-10-09 MED ORDER — HYDROCORTISONE 1 % EX CREA
1.0000 "application " | TOPICAL_CREAM | Freq: Three times a day (TID) | CUTANEOUS | Status: DC | PRN
  Filled 2014-10-09: qty 28

## 2014-10-09 NOTE — CV Procedure (Signed)
  CARDIOVERSION NOTE   Procedure: Electrical Cardioversion Indications:  Atrial Flutter  Procedure Details:  Consent: Risks of procedure as well as the alternatives and risks of each were explained to the (patient/caregiver).  Consent for procedure obtained.  Time Out: Verified patient identification, verified procedure, site/side was marked, verified correct patient position, special equipment/implants available, medications/allergies/relevent history reviewed, required imaging and test results available.  Performed  Patient placed on cardiac monitor, pulse oximetry, supplemental oxygen as necessary.  Sedation given: propothol 80 mg and lidocaine 100 mg Pacer pads placed anterior and posterior chest.  Cardioverted 1 time(s).  Cardioverted at 100.  Evaluation: Findings: Post procedure EKG shows: Sinus bradycardia with PAC's on telemetry Complications: None Patient did tolerate procedure well.    Troy Sine, MD, [facility] 10/09/2014 12:33 PM

## 2014-10-09 NOTE — Discharge Instructions (Signed)
Electrical Cardioversion °Electrical cardioversion is the delivery of a jolt of electricity to change the rhythm of the heart. Sticky patches or metal paddles are placed on the chest to deliver the electricity from a device. This is done to restore a normal rhythm. A rhythm that is too fast or not regular keeps the heart from pumping well. °Electrical cardioversion is done in an emergency if:  °· There is low or no blood pressure as a result of the heart rhythm.   °· Normal rhythm must be restored as fast as possible to protect the brain and heart from further damage.   °· It may save a life. °Cardioversion may be done for heart rhythms that are not immediately life threatening, such as atrial fibrillation or flutter, in which:  °· The heart is beating too fast or is not regular.   °· Medicine to change the rhythm has not worked.   °· It is safe to wait in order to allow time for preparation. °· Symptoms of the abnormal rhythm are bothersome. °· The risk of stroke and other serious problems can be reduced. °LET YOUR HEALTH CARE PROVIDER KNOW ABOUT:  °· Any allergies you have. °· All medicines you are taking, including vitamins, herbs, eye drops, creams, and over-the-counter medicines. °· Previous problems you or members of your family have had with the use of anesthetics.   °· Any blood disorders you have.   °· Previous surgeries you have had.   °· Medical conditions you have. °RISKS AND COMPLICATIONS  °Generally, this is a safe procedure. However, problems can occur and include:  °· Breathing problems related to the anesthetic used. °· A blood clot that breaks free and travels to other parts of your body. This could cause a stroke or other problems. The risk of this is lowered by use of blood-thinning medicine (anticoagulant) prior to the procedure. °· Cardiac arrest (rare). °BEFORE THE PROCEDURE  °· You may have tests to detect blood clots in your heart and to evaluate heart function.  °· You may start taking  anticoagulants so your blood does not clot as easily.   °· Medicines may be given to help stabilize your heart rate and rhythm. °PROCEDURE °· You will be given medicine through an IV tube to reduce discomfort and make you sleepy (sedative).   °· An electrical shock will be delivered. °AFTER THE PROCEDURE °Your heart rhythm will be watched to make sure it does not change.  °Document Released: 02/21/2002 Document Revised: 07/18/2013 Document Reviewed: 09/15/2012 °ExitCare® Patient Information ©2015 [employer]. This information is not intended to replace advice given to you by your health care provider. Make sure you discuss any questions you have with your health care provider. ° °

## 2014-10-09 NOTE — Anesthesia Postprocedure Evaluation (Signed)
Anesthesia Post Note  Patient: Erik [name]  Procedure(s) Performed: Procedure(s) (LRB): CARDIOVERSION (N/A)  Anesthesia type: general  Patient location: PACU  Post pain: Pain level controlled  Post assessment: Patient's Cardiovascular Status Stable  Last Vitals:  Filed Vitals:   10/09/14 1300  BP: 106/37  Pulse: 67  Temp:   Resp: 17    Post vital signs: Reviewed and stable  Level of consciousness: sedated  Complications: No apparent anesthesia complications

## 2014-10-09 NOTE — H&P (View-Only) (Signed)
Patient ID: Erik [name], male   DOB: 1935-10-14, 79 y.o.   MRN: [id number]     HPI: Erik [name] is a 79 y.o. male who presents to the office today for a  follow up cardiology evaluation following his recent aortic valve replacement surgery and development of atrial flutter.  I had seen [name] in cardiology consultation in April 2016 while in the hospital after he experienced a brief episode of chest discomfort associated with presyncope and transient visual blurring.  Physical examination suggested a murmur of severe aortic stenosis.  This was verified by echo Doppler data, which suggested critical AS.  I performed a right and left heart catheterization 06/22/2014.  He was found to have critical aortic valve stenosis without significant coronary obstructive disease with only a smooth 20% luminal narrowing of the proximal RCA and moderate global LV dysfunction with an EF of 35-40%.  He had upper normal right heart pressures.  He underwent aortic valve replacement surgery by Dr. Roxy Manns using a bioprosthetic tissue valve on 06/30/2014.  Of note, during his hospitalization, he was taken off all beta blockers because of bradycardia.  Prior to seeing Dr. Roxy Manns in the office he had a postoperative visit and saw Tanzania Simmonds in our office at which time he was noted to be in atrial flutter.  Anticoagulation was recommended.  He was started on warfarin.  Of note, with the patient saw Dr. Roxy Manns . bradycardia, Dr. Roxy Manns suggested holding off institution of beta blocker therapy, but if he did not spontaneously cardiovert to consider initiation of amiodarone.  I saw for initial evaluation on 08/22/2014.  At that time, he was still in atrial flutter and I recommended institution of low-dose amiodarone therapy.  The patient went home and after further consideration, particularly with his 40 year history of chemical exposure to his lungs with work and concern of lung disease.  He did not want to institute amiodarone and  preferred beta blocker therapy.  I started him on metoprolol 12.5 mg twice a day.  He has been on Coumadin anticoagulation for slightly over 1 month presents for follow up evaluation today.  He denies any episodes of chest pain.  He denies bleeding.  His INR last week was 2.3.  He denies PND, orthopnea.  He is unaware of any heart rhythm irregularity.   Past Medical History  Diagnosis Date  . Heart murmur   . Chest pain 06/19/2014  . Aortic stenosis, severe   . Chronic diastolic congestive heart failure   . Aortic valve stenosis, severe   . S/P aortic valve replacement with bioprosthetic valve 06/30/2014    25 mm [facility] Ease bovine pericardial tissue valve  . Atrial flutter, post-operative 07/27/2014    Past Surgical History  Procedure Laterality Date  . Appendectomy    . Hand surgery    . Cataract extraction Bilateral ? 2013    & 2014  . Left and right heart catheterization with coronary angiogram N/A 06/22/2014    Procedure: LEFT AND RIGHT HEART CATHETERIZATION WITH CORONARY ANGIOGRAM;  Surgeon: Troy Sine, MD;  Location: [facility] CATH LAB;  Service: Cardiovascular;  Laterality: N/A;  . Aortic valve replacement N/A 06/30/2014    Procedure: AORTIC VALVE REPLACEMENT (AVR);  Surgeon: Rexene Alberts, MD;  Location: [facility];  Service: Open Heart Surgery;  Laterality: N/A;  . Intraoperative transesophageal echocardiogram N/A 06/30/2014    Procedure: INTRAOPERATIVE TRANSESOPHAGEAL ECHOCARDIOGRAM;  Surgeon: Rexene Alberts, MD;  Location: [place];  Service: Open Heart  Surgery;  Laterality: N/A;    No Known Allergies  Current Outpatient Prescriptions  Medication Sig Dispense Refill  . aspirin 325 MG tablet Take 325 mg by mouth daily.    Marland Kitchen lisinopril (PRINIVIL,ZESTRIL) 5 MG tablet Take 1 tablet (5 mg total) by mouth daily. 30 tablet 3  . metoprolol tartrate (LOPRESSOR) 25 MG tablet Take by mouth 2 (two) times daily. Take 1 tablet in the morning and 1/2 tablet in the evening    . warfarin  (COUMADIN) 5 MG tablet Take 1 to 1.5 tablets by mouth daily or as directed by coumadin clinic 40 tablet 5   No current facility-administered medications for this visit.    History   Social History  . Marital Status: Married    Spouse Name: N/A  . Number of Children: N/A  . Years of Education: N/A   Occupational History  . Not on file.   Social History Main Topics  . Smoking status: Former Research scientist (life sciences)  . Smokeless tobacco: Never Used     Comment: smoked in college  . Alcohol Use: Not on file     Comment: occ   . Drug Use: No  . Sexual Activity: Not on file   Other Topics Concern  . Not on file   Social History Narrative    Family History  Problem Relation Age of Onset  . Stroke Mother   . Hypertension Mother   . Cancer Father     ROS General: Negative; No fevers, chills, or night sweats HEENT: Negative; No changes in vision or hearing, sinus congestion, difficulty swallowing Pulmonary: Negative; No cough, wheezing, shortness of breath, hemoptysis Cardiovascular: See HPI:  GI: Negative; No nausea, vomiting, diarrhea, or abdominal pain GU: Negative; No dysuria, hematuria, or difficulty voiding Musculoskeletal: Negative; no myalgias, joint pain, or weakness Hematologic: Negative; no easy bruising, bleeding Endocrine: Negative; no heat/cold intolerance; no diabetes, Neuro: Negative; no changes in balance, headaches Skin: Negative; No rashes or skin lesions Psychiatric: Negative; No behavioral problems, depression Sleep: Negative; No snoring,  daytime sleepiness, hypersomnolence, bruxism, restless legs, hypnogognic hallucinations. Other comprehensive 14 point system review is negative   Physical Exam BP 132/64 mmHg  Pulse 67  Ht _0  (1.854 m)  Wt 177 lb (80.287 kg)  BMI 23.36 kg/m2 Wt Readings from Last 3 Encounters:  09/11/14 177 lb (80.287 kg)  08/22/14 174 lb 6.4 oz (79.107 kg)  07/31/14 172 lb (78.019 kg)   General: Alert, oriented, no distress.  Skin:  normal turgor, no rashes, warm and dry HEENT: Normocephalic, atraumatic. Pupils equal round and reactive to light; sclera anicteric; extraocular muscles intact, No lid lag; Nose without nasal septal hypertrophy; Mouth/Parynx benign; Mallinpatti scale 2 Neck: No JVD, no carotid bruits; normal carotid upstroke Lungs: clear to ausculatation and percussion bilaterally; no wheezing or rales, normal inspiratory and expiratory effort Chest wall: without tenderness to palpitation Heart: PMI not displaced, RRR, s1 s2 normal, 1/6 systolic murmur, No diastolic murmur, no rubs, gallops, thrills, or heaves Abdomen: soft, nontender; no hepatosplenomehaly, BS+; abdominal aorta nontender and not dilated by palpation. Back: no CVA tenderness Pulses: 2+  Musculoskeletal: full range of motion, normal strength, no joint deformities Extremities: Pulses 2+, no clubbing cyanosis or edema, Homan's sign negative  Neurologic: grossly nonfocal; Cranial nerves grossly wnl Psychologic: Normal mood and affect  ECG (independently read by me): Atrial flutter with 41 block.  One isolated PVC.  Ventricular rate 67 bpm.  Nonspecific ST-T change   ECG (independently read by me): Underlying atrial  flutter with a ventricular rate at 88 with frequent PVCs in a bigeminal pattern transiently.  LABS:  BMP Latest Ref Rng 08/22/2014 07/27/2014 07/05/2014  Glucose 70 - 99 mg/dL 86 97 105(H)  BUN 6 - 23 mg/dL _0 Creatinine 0.50 - 1.35 mg/dL 1.10 1.16 1.22  Sodium 135 - 145 mEq/L 138 136 134(L)  Potassium 3.5 - 5.3 mEq/L 5.0 5.1 3.8  Chloride 96 - 112 mEq/L 103 102 99  CO2 19 - 32 mEq/L _1 Calcium 8.4 - 10.5 mg/dL 9.9 9.8 8.7     Hepatic Function Latest Ref Rng 08/22/2014 06/29/2014 06/23/2014  Total Protein 6.0 - 8.3 g/dL 7.7 7.1 6.3  Albumin 3.5 - 5.2 g/dL 4.2 3.9 3.2(L)  AST 0 - 37 U/L _2 ALT 0 - 53 U/L _3 Alk Phosphatase 39 - 117 U/L 79 58 57  Total Bilirubin 0.2 - 1.2 mg/dL 0.8 1.0 1.7(H)    CBC  Latest Ref Rng 07/05/2014 07/03/2014 07/02/2014  WBC 4.0 - 10.5 K/uL 5.9 6.9 10.4  Hemoglobin 13.0 - 17.0 g/dL 8.8(L) 9.0(L) 9.5(L)  Hematocrit 39.0 - 52.0 % 25.2(L) 25.9(L) 27.8(L)  Platelets 150 - 400 K/uL 206 122(L) 132(L)   Lab Results  Component Value Date   MCV 102.0* 07/05/2014   MCV 103.6* 07/03/2014   MCV 101.8* 07/02/2014    Lab Results  Component Value Date   TSH 1.571 08/22/2014    BNP    Component Value Date/Time   BNP 1282.9* 06/19/2014 1630    ProBNP No results found for: PROBNP   Lipid Panel     Component Value Date/Time   CHOL 135 06/19/2014 2134   TRIG 60 06/19/2014 2134   HDL 35* 06/19/2014 2134   CHOLHDL 3.9 06/19/2014 2134   VLDL 12 06/19/2014 2134   LDLCALC 88 06/19/2014 2134     RADIOLOGY: No results found.    ASSESSMENT AND PLAN: Mr. Laureano Hetzer is a 79 year old gentleman who was found to have severe symptomatic critical aortic valve stenosis after he presented to the hospital following a presyncopal spell.  He successfully underwent aortic valve replacement surgery on 06/30/2014.  He is now on Coumadin therapy.  His INR last week was therapeutic and he has been on Coumadin for slightly over a month.  At his last office visit.  I discussed initiation of low-dose amiodarone, but ultimately he did not want to pursue this.  3.  Concerns for lung toxicity with his prior forty-year chemical exposure from work.  I instituted very low-dose beta blocker therapy.  He seems to have tolerated this without significant bradycardia arrhythmia.  Just sitting now try increasing this to 25 mm in the morning but to continue to take 12.5 Milgram's at night.  If his pulse gets below 50.  He will resume to 12.5 twice a day dosing.  I discussed attempt at cardioversion for restoration of sinus rhythm.  We will try to schedule this to be done the week after the Fourth of July and hopefully he will be successfully cardioverted and will not need antiarrhythmics treatment.   If recurrent atrial flutter develops he may be a candidate for atrial flutter ablation or anti-arrythmic therapy will need to be initiated.  Laboratory will be checked prior to his cardioversion.  I set he also comes to the office 2 days before the scheduled date to make certain he is still in atrial flutter and has not converted.  Time spent: 25  minutes  Troy Sine, MD, [facility]  09/11/2014 5:38 PM

## 2014-10-09 NOTE — Anesthesia Preprocedure Evaluation (Addendum)
Anesthesia Evaluation  Patient identified by MRN, date of birth, ID band Patient awake    Reviewed: Allergy & Precautions, NPO status , Patient's Chart, lab work & pertinent test results, reviewed documented beta blocker date and time   Airway Mallampati: II  TM Distance: >3 FB Neck ROM: Full    Dental  (+) Teeth Intact, Dental Advisory Given   Pulmonary former smoker,  breath sounds clear to auscultation        Cardiovascular + dysrhythmias Atrial Fibrillation Rhythm:Irregular Rate:Normal  Ao Valve Repl 06/2014 New onset At flutter 07/2014 EF 55%   Neuro/Psych negative neurological ROS     GI/Hepatic negative GI ROS, Neg liver ROS,   Endo/Other  negative endocrine ROS  Renal/GU negative Renal ROS     Musculoskeletal negative musculoskeletal ROS (+)   Abdominal   Peds  Hematology negative hematology ROS (+)   Anesthesia Other Findings   Reproductive/Obstetrics                          Anesthesia Physical Anesthesia Plan  ASA: III  Anesthesia Plan: General   Post-op Pain Management:    Induction: Intravenous  Airway Management Planned: Mask  Additional Equipment:   Intra-op Plan:   Post-operative Plan:   Informed Consent: I have reviewed the patients History and Physical, chart, labs and discussed the procedure including the risks, benefits and alternatives for the proposed anesthesia with the patient or authorized representative who has indicated his/her understanding and acceptance.     Plan Discussed with: CRNA  Anesthesia Plan Comments:         Anesthesia Quick Evaluation

## 2014-10-09 NOTE — Transfer of Care (Signed)
Immediate Anesthesia Transfer of Care Note  Patient: Erik [name]  Procedure(s) Performed: Procedure(s): CARDIOVERSION (N/A)  Patient Location: PACU and Endoscopy Unit  Anesthesia Type:MAC  Level of Consciousness: awake, alert , oriented and patient cooperative  Airway & Oxygen Therapy: Patient Spontanous Breathing and Patient connected to nasal cannula oxygen  Post-op Assessment: Report given to RN, Post -op Vital signs reviewed and stable and Patient moving all extremities  Post vital signs: Reviewed and stable  Last Vitals:  Filed Vitals:   10/09/14 1245  BP: 84/57  Pulse: 65  Temp:   Resp: 10    Complications: No apparent anesthesia complications

## 2014-10-09 NOTE — Interval H&P Note (Signed)
History and Physical Interval Note:  10/09/2014 12:23 PM  Erik [name]  has presented today for surgery, with the diagnosis of  Atrial Flutter  The various methods of treatment have been discussed with the patient and family. After consideration of risks, benefits and other options for treatment, the patient has consented to  Procedure(s): CARDIOVERSION (N/A) as a surgical intervention .  The patient's history has been reviewed, patient examined, no change in status, stable for surgery.  I have reviewed the patient's chart and labs.  Questions were answered to the patient's satisfaction.     Iretha Kirley A

## 2014-10-10 ENCOUNTER — Telehealth: Payer: Self-pay | Admitting: Cardiovascular Disease

## 2014-10-10 ENCOUNTER — Encounter (HOSPITAL_COMMUNITY): Payer: Self-pay | Admitting: Cardiovascular Disease

## 2014-10-10 NOTE — Telephone Encounter (Signed)
I spoke with patient's wife.  She reported that Mr Westling's heart rate was 51 this morning and his bp is running 85/49.  Mr Blick denies dizziness or lightheadedness but does feel a little fatigued. Upon reviewing vitals from yesterday, it appears that his bp was low after the DCCV, down into the 80s.  I advised patient to continue to monitor and  hold lisinopril for sbp<100.  Keep a log and bring with him to his follow up appt and call with any additional symptoms or concerns.  I will also route this message to Dr Claiborne Billings for any additional instructions.

## 2014-10-10 NOTE — Telephone Encounter (Signed)
Pt's wife called in stating that he had a cardioversion yesterday and now is BP is running at about 85/49. She is concerned and would like to speak with someone about it. Please call  Thanks

## 2014-10-11 ENCOUNTER — Ambulatory Visit (HOSPITAL_COMMUNITY): Payer: Medicare Other

## 2014-10-11 NOTE — Telephone Encounter (Signed)
Patient was previously instructed to hold lopressor if pulse<50 by Dr Claiborne Billings.

## 2014-10-11 NOTE — Telephone Encounter (Signed)
Agree; also if P < 52 would decrease lopressor dose by half

## 2014-10-12 ENCOUNTER — Ambulatory Visit (HOSPITAL_COMMUNITY): Payer: Medicare Other

## 2014-10-13 ENCOUNTER — Ambulatory Visit (HOSPITAL_COMMUNITY): Payer: Medicare Other

## 2014-10-16 ENCOUNTER — Encounter (HOSPITAL_COMMUNITY): Payer: Medicare Other

## 2014-10-16 ENCOUNTER — Ambulatory Visit (HOSPITAL_COMMUNITY): Payer: Medicare Other

## 2014-10-18 ENCOUNTER — Encounter (HOSPITAL_COMMUNITY): Payer: Medicare Other

## 2014-10-18 ENCOUNTER — Ambulatory Visit (HOSPITAL_COMMUNITY): Payer: Medicare Other

## 2014-10-20 ENCOUNTER — Encounter (HOSPITAL_COMMUNITY): Payer: Medicare Other

## 2014-10-20 ENCOUNTER — Ambulatory Visit (INDEPENDENT_AMBULATORY_CARE_PROVIDER_SITE_OTHER): Payer: Medicare Other | Admitting: Pharmacist Clinician (PhC)/ Clinical Pharmacy Specialist

## 2014-10-20 ENCOUNTER — Ambulatory Visit (HOSPITAL_COMMUNITY): Payer: Medicare Other

## 2014-10-20 DIAGNOSIS — Z7901 Long term (current) use of anticoagulants: Secondary | ICD-10-CM

## 2014-10-20 DIAGNOSIS — Z954 Presence of other heart-valve replacement: Secondary | ICD-10-CM | POA: Diagnosis not present

## 2014-10-20 DIAGNOSIS — Z953 Presence of xenogenic heart valve: Secondary | ICD-10-CM

## 2014-10-20 LAB — POCT INR: INR: 2.2

## 2014-10-23 ENCOUNTER — Ambulatory Visit (HOSPITAL_COMMUNITY): Payer: Medicare Other

## 2014-10-23 ENCOUNTER — Encounter (HOSPITAL_COMMUNITY): Payer: Medicare Other

## 2014-10-25 ENCOUNTER — Ambulatory Visit (INDEPENDENT_AMBULATORY_CARE_PROVIDER_SITE_OTHER): Payer: Medicare Other | Admitting: Physician Assistant

## 2014-10-25 ENCOUNTER — Encounter (HOSPITAL_COMMUNITY): Payer: Medicare Other

## 2014-10-25 ENCOUNTER — Encounter: Payer: Self-pay | Admitting: Physician Assistant

## 2014-10-25 ENCOUNTER — Ambulatory Visit (HOSPITAL_COMMUNITY): Payer: Medicare Other

## 2014-10-25 VITALS — BP 102/60 | HR 64 | Ht 73.0 in | Wt 175.2 lb

## 2014-10-25 DIAGNOSIS — I25119 Atherosclerotic heart disease of native coronary artery with unspecified angina pectoris: Secondary | ICD-10-CM

## 2014-10-25 DIAGNOSIS — I4892 Unspecified atrial flutter: Secondary | ICD-10-CM | POA: Diagnosis not present

## 2014-10-25 DIAGNOSIS — I959 Hypotension, unspecified: Secondary | ICD-10-CM | POA: Diagnosis not present

## 2014-10-25 DIAGNOSIS — I483 Typical atrial flutter: Secondary | ICD-10-CM | POA: Diagnosis not present

## 2014-10-25 DIAGNOSIS — Z954 Presence of other heart-valve replacement: Secondary | ICD-10-CM | POA: Diagnosis not present

## 2014-10-25 DIAGNOSIS — Z953 Presence of xenogenic heart valve: Secondary | ICD-10-CM

## 2014-10-25 MED ORDER — LISINOPRIL 5 MG PO TABS: 5.0000 mg | ORAL_TABLET | Freq: Every day | ORAL | Status: DC

## 2014-10-25 NOTE — Progress Notes (Signed)
Patient ID: Erik [name], male   DOB: Aug 02, 1935, 79 y.o.   MRN: [id number]    Date:  10/25/2014   ID:  Erik [name], DOB 1935-08-27, MRN [id number]  PCP:  Jani Gravel, MD  Primary Cardiologist:  Claiborne Billings   Chief complaint: Post hospital follow-up   History of Present Illness:  Erik [name] is a 79 y.o. male who underwent aortic valve replacement with Dr. Roxy Manns on 06/30/2014 with a 25 mm Edwards magna ease bovinepericardial tissue valve.  left heart catheterization 06/22/2014, prior to valve replacement, revealed no significant coronary obstructive disease with only a smooth 20% luminal narrowing of the proximal RCA and moderate global LV dysfunction with an EF of 35-40%. He had upper normal right heart pressures. During his hospitalization, he was taken off all beta blockers because of bradycardia. Posthospital follow-up revealed atrial flutter  He was started on warfarin. Dr. Roxy Manns suggested holding off institution of beta blocker therapy, but if he did not spontaneously cardiovert to consider initiation of amiodarone. Dr Claiborne Billings saw for initial evaluation on 08/22/2014. At that time, he was still in atrial flutter and he recommended institution of low-dose amiodarone therapy. The patient went home and after further consideration, particularly with his 40 year history of chemical exposure to his lungs with work and concern of lung disease. He did not want to institute amiodarone and preferred beta blocker therapy. He was started on metoprolol 12.5 mg twice a day.   Patient underwent cardioversion on 10/09/2014.  Post procedural EKG showed sinus bradycardia with PACs.  Patient is here for follow-up.  On July 26 patient called in and blood pressure was 85/49 heart rate of 51. He was asymptomatic. Lisinopril was held for blood pressure under 100..  Today he reports no complaints. He walked a mile and a half yesterday with no problems he feels really well. His EKG however, shows atrial flutter,  4-1.     The patient currently denies nausea, vomiting, fever, chest pain, shortness of breath, orthopnea, dizziness, PND, cough, congestion, abdominal pain, hematochezia, melena, lower extremity edema, claudication.  Wt Readings from Last 3 Encounters:  10/25/14 175 lb 3.2 oz (79.47 kg)  10/06/14 177 lb (80.287 kg)  10/04/14 177 lb (80.287 kg)     Past Medical History  Diagnosis Date  . Heart murmur   . Chest pain 06/19/2014  . Aortic stenosis, severe   . Chronic diastolic congestive heart failure   . Aortic valve stenosis, severe   . S/P aortic valve replacement with bioprosthetic valve 06/30/2014    25 mm [facility] Ease bovine pericardial tissue valve  . Atrial flutter, post-operative 07/27/2014    Current Outpatient Prescriptions  Medication Sig Dispense Refill  . aspirin EC 81 MG tablet Take 81 mg by mouth daily.    Marland Kitchen lisinopril (PRINIVIL,ZESTRIL) 5 MG tablet Take 1 tablet (5 mg total) by mouth daily. 30 tablet 5  . metoprolol tartrate (LOPRESSOR) 25 MG tablet Take 12.5 mg by mouth daily.     Marland Kitchen warfarin (COUMADIN) 5 MG tablet Take 1 to 1.5 tablets by mouth daily or as directed by coumadin clinic (Patient taking differently: Take 5-7.5 mg by mouth daily. Take 5mg  by mouth on Sunday and take 7.5mg  by mouth on all other days.) 40 tablet 5   No current facility-administered medications for this visit.    Allergies:   No Known Allergies  Social History:  The patient  reports that he has never smoked. He has never used smokeless tobacco. He reports that he  does not use illicit drugs.   Family history:   Family History  Problem Relation Age of Onset  . Stroke Mother   . Hypertension Mother   . Cancer Father     ROS:  Please see the history of present illness.  All other systems reviewed and negative.   PHYSICAL EXAM: VS:  BP 102/60 mmHg  Pulse 64  Ht 6\' 1"  (1.854 m)  Wt 175 lb 3.2 oz (79.47 kg)  BMI 23.12 kg/m2 Well nourished, well developed, in no acute distress HEENT:  Pupils are equal round react to light accommodation extraocular movements are intact.  Neck: no JVDNo cervical lymphadenopathy. Cardiac: Regular rate and rhythm without murmurs rubs or gallops. Lungs:  clear to auscultation bilaterally, no wheezing, rhonchi or rales Ext: no lower extremity edema.  2+ radial and dorsalis pedis pulses. Skin: warm and dry Neuro:  Grossly normal  EKG:    Atrial flutter 41, rate 64 bpm  ASSESSMENT AND PLAN:  Problem List Items Addressed This Visit    Typical atrial flutter    Patient is back in atrial flutter. He is on Coumadin and his rate is well controlled. He is asymptomatic.  We'll continue his Lopressor 12.5 mg twice daily. And will refer him to Dr. Rayann Heman for consideration of anti-rhythmic and or ablation.        Relevant Medications   lisinopril (PRINIVIL,ZESTRIL) 5 MG tablet   S/P aortic valve replacement with bioprosthetic valve    Status post 25 mm Edwards magna ease bovine pericardial tissue valve. Patient is doing quite well from the standpoint.      Hypotension    Patient was hypotensive back on July 26 at 64 m mercury systolic. Today in the office his blood pressures 102/60. Going to discontinue his [facility] probably at this time. He did need a refill for that medication so we gave him a paper prescription.  He will monitor his blood pressure over the next several days and should be over 130/80 Philip a prescription.  However, I doubt this is going to happen.    He is okay to start cardiac rehabilitation tomorrow.      Relevant Medications   lisinopril (PRINIVIL,ZESTRIL) 5 MG tablet   Atrial flutter, post-operative - Primary   Relevant Medications   lisinopril (PRINIVIL,ZESTRIL) 5 MG tablet   Other Relevant Orders   EKG 12-Lead

## 2014-10-25 NOTE — Assessment & Plan Note (Signed)
Patient is back in atrial flutter. He is on Coumadin and his rate is well controlled. He is asymptomatic.  We'll continue his Lopressor 12.5 mg twice daily. And will refer him to Dr. Rayann Heman for consideration of anti-rhythmic and or ablation.

## 2014-10-25 NOTE — Patient Instructions (Addendum)
Medication Instructions:  Your physician recommends that you continue on your current medications as directed. Please refer to the Current Medication list given to you today.   Labwork: None   Testing/Procedures: None   Follow-Up: Your physician recommends that you schedule a follow-up appointment with Dr.Allred with in 1 month/ if not available with Ceasar Lund in the Afib clinic  Your physician recommends that you schedule a follow-up appointment with [facility] after your appointment with EP   Any Other Special Instructions Will Be Listed Below (If Applicable).

## 2014-10-25 NOTE — Assessment & Plan Note (Signed)
Patient was hypotensive back on July 26 at 42 m mercury systolic. Today in the office his blood pressures 102/60. Going to discontinue his [facility] probably at this time. He did need a refill for that medication so we gave him a paper prescription.  He will monitor his blood pressure over the next several days and should be over 130/80 Philip a prescription.  However, I doubt this is going to happen.    He is okay to start cardiac rehabilitation tomorrow.

## 2014-10-25 NOTE — Assessment & Plan Note (Signed)
Status post 25 mm Edwards magna ease bovine pericardial tissue valve. Patient is doing quite well from the standpoint.

## 2014-10-26 ENCOUNTER — Encounter (HOSPITAL_COMMUNITY)
Admission: RE | Admit: 2014-10-26 | Discharge: 2014-10-26 | Disposition: A | Discharge status: Home / Self Care | Payer: Medicare Other | Source: Ambulatory Visit | Attending: Cardiovascular Disease | Admitting: Cardiovascular Disease

## 2014-10-26 DIAGNOSIS — Z953 Presence of xenogenic heart valve: Secondary | ICD-10-CM | POA: Insufficient documentation

## 2014-10-26 DIAGNOSIS — Z48812 Encounter for surgical aftercare following surgery on the circulatory system: Secondary | ICD-10-CM | POA: Insufficient documentation

## 2014-10-26 NOTE — Progress Notes (Signed)
Cardiac Rehab Medication Review by a Pharmacist  Does the patient  feel that his/her medications are working for him/her?  yes  Has the patient been experiencing any side effects to the medications prescribed?  no  Does the patient measure his/her own blood pressure or blood glucose at home?  yes   Does the patient have any problems obtaining medications due to transportation or finances?   no  Understanding of regimen: excellent Understanding of indications: excellent Potential of compliance: excellent    Pharmacist comments: Very aware of why he is taking medications and is very well educated on how to take them.    Melburn Popper 10/26/2014 9:17 AM

## 2014-10-27 ENCOUNTER — Ambulatory Visit (HOSPITAL_COMMUNITY): Payer: Medicare Other

## 2014-10-27 ENCOUNTER — Encounter (HOSPITAL_COMMUNITY): Payer: Medicare Other

## 2014-10-30 ENCOUNTER — Encounter (HOSPITAL_COMMUNITY): Payer: Medicare Other

## 2014-10-30 ENCOUNTER — Encounter (HOSPITAL_COMMUNITY)
Admission: RE | Admit: 2014-10-30 | Discharge: 2014-10-30 | Disposition: A | Discharge status: Home / Self Care | Payer: Medicare Other | Source: Ambulatory Visit | Attending: Cardiovascular Disease | Admitting: Cardiovascular Disease

## 2014-10-30 ENCOUNTER — Ambulatory Visit (HOSPITAL_COMMUNITY): Payer: Medicare Other

## 2014-10-30 DIAGNOSIS — Z953 Presence of xenogenic heart valve: Secondary | ICD-10-CM | POA: Diagnosis not present

## 2014-10-30 DIAGNOSIS — Z48812 Encounter for surgical aftercare following surgery on the circulatory system: Secondary | ICD-10-CM | POA: Diagnosis not present

## 2014-10-30 NOTE — Progress Notes (Signed)
Pt started cardiac rehab today.  Pt tolerated light exercise without difficulty. VSS, telemetry-Atrial flutter at a controlled rate, asymptomatic.  Medication list reconciled.  Pt verbalized compliance with medications and denies barriers to compliance. PSYCHOSOCIAL ASSESSMENT:  PHQ-0. Pt exhibits positive coping skills, hopeful outlook with supportive family. No psychosocial needs identified at this time, no psychosocial interventions necessary.    Pt enjoys working in the shop.   Pt cardiac rehab  goal is  to understand what to do physically.  Pt encouraged to participate in exercising on your own to increase ability to achieve these goals.   Pt long term cardiac rehab goal is get back to normal and have less visits to the hospital.  Pt oriented to exercise equipment and routine.  Understanding verbalized. Will continue to monitor the patient throughout  the program.

## 2014-10-31 ENCOUNTER — Ambulatory Visit: Payer: Medicare Other | Admitting: Cardiology

## 2014-11-01 ENCOUNTER — Encounter (HOSPITAL_COMMUNITY)
Admission: RE | Admit: 2014-11-01 | Discharge: 2014-11-01 | Disposition: A | Discharge status: Home / Self Care | Payer: Medicare Other | Source: Ambulatory Visit | Attending: Cardiovascular Disease | Admitting: Cardiovascular Disease

## 2014-11-01 ENCOUNTER — Encounter (HOSPITAL_COMMUNITY): Payer: Medicare Other

## 2014-11-01 ENCOUNTER — Ambulatory Visit (HOSPITAL_COMMUNITY): Payer: Medicare Other

## 2014-11-01 DIAGNOSIS — Z953 Presence of xenogenic heart valve: Secondary | ICD-10-CM | POA: Diagnosis not present

## 2014-11-01 DIAGNOSIS — Z48812 Encounter for surgical aftercare following surgery on the circulatory system: Secondary | ICD-10-CM | POA: Diagnosis not present

## 2014-11-03 ENCOUNTER — Telehealth: Payer: Self-pay | Admitting: Cardiovascular Disease

## 2014-11-03 ENCOUNTER — Encounter (HOSPITAL_COMMUNITY)
Admission: RE | Admit: 2014-11-03 | Discharge: 2014-11-03 | Disposition: A | Discharge status: Home / Self Care | Payer: Medicare Other | Source: Ambulatory Visit | Attending: Cardiovascular Disease | Admitting: Cardiovascular Disease

## 2014-11-03 ENCOUNTER — Ambulatory Visit (INDEPENDENT_AMBULATORY_CARE_PROVIDER_SITE_OTHER): Payer: Medicare Other | Admitting: Pharmacist Clinician (PhC)/ Clinical Pharmacy Specialist

## 2014-11-03 ENCOUNTER — Encounter (HOSPITAL_COMMUNITY): Payer: Medicare Other

## 2014-11-03 ENCOUNTER — Ambulatory Visit (HOSPITAL_COMMUNITY): Payer: Medicare Other

## 2014-11-03 DIAGNOSIS — Z954 Presence of other heart-valve replacement: Secondary | ICD-10-CM | POA: Diagnosis not present

## 2014-11-03 DIAGNOSIS — Z48812 Encounter for surgical aftercare following surgery on the circulatory system: Secondary | ICD-10-CM | POA: Diagnosis not present

## 2014-11-03 DIAGNOSIS — Z953 Presence of xenogenic heart valve: Secondary | ICD-10-CM

## 2014-11-03 DIAGNOSIS — Z7901 Long term (current) use of anticoagulants: Secondary | ICD-10-CM

## 2014-11-03 LAB — POCT INR: INR: 2.3

## 2014-11-03 NOTE — Telephone Encounter (Signed)
Agree with plan Brian Crenshaw  

## 2014-11-03 NOTE — Progress Notes (Signed)
Frequent PVC's noted today during exercise at cardiac rehab. Patient asymptomatic. Vital signs stable. Trigeminal PVC's noted at times. Erik [name] said he worked out harder than usual today. [name] RN called at Dr Evette Georges office and notified about today's ectopy. Will fax exercise flow sheets to Dr. Evette Georges office for review with today's ECG tracings. No complaints upon exit from cardiac rehab. Will continue to monitor the patient throughout  the program.

## 2014-11-03 NOTE — Telephone Encounter (Signed)
Incoming call from [place] at [place].  Pt has history of atrial flutter, recently cardioverted on 7/25 and seen recently in [place] for return of symptoms, pending consult by Dr. Rayann Heman 9/14.  States patient having "frequent PVCs" on strip readings today.  BP - 100/58 - HR 80s. Normal tolerance to exercise w/ no concerns. No complaints or symptoms issued by patient today.  Advised OK to send patient home. Will contact at home if any recommendations. Routing to [facility] (Dr. Stanford Breed).

## 2014-11-06 ENCOUNTER — Ambulatory Visit (HOSPITAL_COMMUNITY): Payer: Medicare Other

## 2014-11-06 ENCOUNTER — Encounter (HOSPITAL_COMMUNITY)
Admission: RE | Admit: 2014-11-06 | Discharge: 2014-11-06 | Disposition: A | Discharge status: Home / Self Care | Payer: Medicare Other | Source: Ambulatory Visit | Attending: Cardiovascular Disease | Admitting: Cardiovascular Disease

## 2014-11-06 ENCOUNTER — Encounter (HOSPITAL_COMMUNITY): Payer: Medicare Other

## 2014-11-06 DIAGNOSIS — Z48812 Encounter for surgical aftercare following surgery on the circulatory system: Secondary | ICD-10-CM | POA: Diagnosis not present

## 2014-11-06 DIAGNOSIS — Z953 Presence of xenogenic heart valve: Secondary | ICD-10-CM | POA: Diagnosis not present

## 2014-11-06 NOTE — Progress Notes (Signed)
Reviewed home exercise guidelines with patient including endpoints, temperature precautions, target heart rate and rate of perceived exertion. Pt is currently walking 60 minutes everyday as his mode of home exercise. Pt voices understanding of instructions given. Sol Passer, MS, ACSM CCEP

## 2014-11-08 ENCOUNTER — Encounter (HOSPITAL_COMMUNITY)
Admission: RE | Admit: 2014-11-08 | Discharge: 2014-11-08 | Disposition: A | Discharge status: Home / Self Care | Payer: Medicare Other | Source: Ambulatory Visit | Attending: Cardiovascular Disease | Admitting: Cardiovascular Disease

## 2014-11-08 ENCOUNTER — Encounter (HOSPITAL_COMMUNITY): Payer: Medicare Other

## 2014-11-08 ENCOUNTER — Ambulatory Visit (HOSPITAL_COMMUNITY): Payer: Medicare Other

## 2014-11-08 DIAGNOSIS — Z48812 Encounter for surgical aftercare following surgery on the circulatory system: Secondary | ICD-10-CM | POA: Diagnosis not present

## 2014-11-08 DIAGNOSIS — Z953 Presence of xenogenic heart valve: Secondary | ICD-10-CM | POA: Diagnosis not present

## 2014-11-10 ENCOUNTER — Encounter (HOSPITAL_COMMUNITY): Payer: Medicare Other

## 2014-11-10 ENCOUNTER — Encounter (HOSPITAL_COMMUNITY)
Admission: RE | Admit: 2014-11-10 | Discharge: 2014-11-10 | Disposition: A | Discharge status: Home / Self Care | Payer: Medicare Other | Source: Ambulatory Visit | Attending: Cardiovascular Disease | Admitting: Cardiovascular Disease

## 2014-11-10 ENCOUNTER — Ambulatory Visit (HOSPITAL_COMMUNITY): Payer: Medicare Other

## 2014-11-10 DIAGNOSIS — Z48812 Encounter for surgical aftercare following surgery on the circulatory system: Secondary | ICD-10-CM | POA: Diagnosis not present

## 2014-11-10 DIAGNOSIS — Z953 Presence of xenogenic heart valve: Secondary | ICD-10-CM | POA: Diagnosis not present

## 2014-11-13 ENCOUNTER — Encounter (HOSPITAL_COMMUNITY): Payer: Medicare Other

## 2014-11-13 ENCOUNTER — Encounter (HOSPITAL_COMMUNITY)
Admission: RE | Admit: 2014-11-13 | Discharge: 2014-11-13 | Disposition: A | Discharge status: Home / Self Care | Payer: Medicare Other | Source: Ambulatory Visit | Attending: Cardiovascular Disease | Admitting: Cardiovascular Disease

## 2014-11-13 ENCOUNTER — Ambulatory Visit (HOSPITAL_COMMUNITY): Payer: Medicare Other

## 2014-11-13 DIAGNOSIS — Z48812 Encounter for surgical aftercare following surgery on the circulatory system: Secondary | ICD-10-CM | POA: Diagnosis not present

## 2014-11-13 DIAGNOSIS — Z953 Presence of xenogenic heart valve: Secondary | ICD-10-CM | POA: Diagnosis not present

## 2014-11-15 ENCOUNTER — Encounter (HOSPITAL_COMMUNITY)
Admission: RE | Admit: 2014-11-15 | Discharge: 2014-11-15 | Disposition: A | Discharge status: Home / Self Care | Payer: Medicare Other | Source: Ambulatory Visit | Attending: Cardiovascular Disease | Admitting: Cardiovascular Disease

## 2014-11-15 ENCOUNTER — Encounter (HOSPITAL_COMMUNITY): Payer: Medicare Other

## 2014-11-15 ENCOUNTER — Ambulatory Visit (HOSPITAL_COMMUNITY): Payer: Medicare Other

## 2014-11-15 DIAGNOSIS — Z953 Presence of xenogenic heart valve: Secondary | ICD-10-CM | POA: Diagnosis not present

## 2014-11-15 DIAGNOSIS — Z48812 Encounter for surgical aftercare following surgery on the circulatory system: Secondary | ICD-10-CM | POA: Diagnosis not present

## 2014-11-17 ENCOUNTER — Encounter (HOSPITAL_COMMUNITY)
Admission: RE | Admit: 2014-11-17 | Discharge: 2014-11-17 | Disposition: A | Discharge status: Home / Self Care | Payer: Medicare Other | Source: Ambulatory Visit | Attending: Cardiovascular Disease | Admitting: Cardiovascular Disease

## 2014-11-17 ENCOUNTER — Ambulatory Visit (HOSPITAL_COMMUNITY): Payer: Medicare Other

## 2014-11-17 ENCOUNTER — Encounter (HOSPITAL_COMMUNITY): Payer: Medicare Other

## 2014-11-17 DIAGNOSIS — Z953 Presence of xenogenic heart valve: Secondary | ICD-10-CM | POA: Diagnosis not present

## 2014-11-17 DIAGNOSIS — Z48812 Encounter for surgical aftercare following surgery on the circulatory system: Secondary | ICD-10-CM | POA: Diagnosis not present

## 2014-11-22 ENCOUNTER — Encounter (HOSPITAL_COMMUNITY)
Admission: RE | Admit: 2014-11-22 | Discharge: 2014-11-22 | Disposition: A | Discharge status: Home / Self Care | Payer: Medicare Other | Source: Ambulatory Visit | Attending: Cardiovascular Disease | Admitting: Cardiovascular Disease

## 2014-11-22 ENCOUNTER — Ambulatory Visit (HOSPITAL_COMMUNITY): Payer: Medicare Other

## 2014-11-22 ENCOUNTER — Encounter (HOSPITAL_COMMUNITY): Payer: Medicare Other

## 2014-11-22 DIAGNOSIS — Z953 Presence of xenogenic heart valve: Secondary | ICD-10-CM | POA: Diagnosis not present

## 2014-11-22 DIAGNOSIS — Z48812 Encounter for surgical aftercare following surgery on the circulatory system: Secondary | ICD-10-CM | POA: Diagnosis not present

## 2014-11-22 NOTE — Progress Notes (Signed)
Erik [name] 79 y.o. male Nutrition Note Spoke with pt. Nutrition Plan and Nutrition Survey goals reviewed with pt. Pt is following Step 2 of the Therapeutic Lifestyle Changes diet. Pt reports he has lost "about 25 lb from 200 lb, which was my wt before surgery." Pt wants to maintain wt loss. Pt with dx of CHF. Per discussion, pt does not limit sodium intake. Pt feels he needs more sodium because he's outside a lot sweating. Recommended intake for sodium and reasons for electrolyte replenishment discussed. Pt eats out infrequently. Pt is taking Coumadin and is aware of the need to follow a diet with Consistent vitamin K intake. Pt expressed understanding of the information reviewed. Pt aware of nutrition education classes offered and is unable to attend nutrition classes.  Lab Results  Component Value Date   HGBA1C 5.1 06/29/2014    Nutrition Diagnosis ? Food-and nutrition-related knowledge deficit related to lack of exposure to information as related to diagnosis of: ? CVD  Nutrition RX/ Estimated Daily Nutrition Needs for: wt  maintenance 2300-2650 Kcal, 75-85 gm fat, 15-18 gm sat fat, 2.3-2.6 gm trans-fat, <1500 mg sodium  Nutrition Intervention ? Pt's individual nutrition plan reviewed with pt. ? Benefits of adopting Therapeutic Lifestyle Changes discussed when Medficts reviewed. ? Pt to attend the Portion Distortion class  ? Pt given handouts for: ? Nutrition I class ? Nutrition II class ? Continue client-centered nutrition education by RD, as part of interdisciplinary care. Goal(s) ? Pt to describe the benefit of including fruits, vegetables, whole grains, and low-fat dairy products in a heart healthy meal plan. Monitor and Evaluate progress toward nutrition goal with team. Nutrition Risk: Change to Moderate Derek Mound, M.Ed, RD, LDN, CDE 11/22/2014 11:34 AM

## 2014-11-24 ENCOUNTER — Encounter (HOSPITAL_COMMUNITY)
Admission: RE | Admit: 2014-11-24 | Discharge: 2014-11-24 | Disposition: A | Discharge status: Home / Self Care | Payer: Medicare Other | Source: Ambulatory Visit | Attending: Cardiovascular Disease | Admitting: Cardiovascular Disease

## 2014-11-24 ENCOUNTER — Encounter (HOSPITAL_COMMUNITY): Payer: Medicare Other

## 2014-11-24 ENCOUNTER — Ambulatory Visit (HOSPITAL_COMMUNITY): Payer: Medicare Other

## 2014-11-24 DIAGNOSIS — Z953 Presence of xenogenic heart valve: Secondary | ICD-10-CM | POA: Diagnosis not present

## 2014-11-24 DIAGNOSIS — Z48812 Encounter for surgical aftercare following surgery on the circulatory system: Secondary | ICD-10-CM | POA: Diagnosis not present

## 2014-11-27 ENCOUNTER — Encounter (HOSPITAL_COMMUNITY)
Admission: RE | Admit: 2014-11-27 | Discharge: 2014-11-27 | Disposition: A | Discharge status: Home / Self Care | Payer: Medicare Other | Source: Ambulatory Visit | Attending: Cardiovascular Disease | Admitting: Cardiovascular Disease

## 2014-11-27 ENCOUNTER — Ambulatory Visit (HOSPITAL_COMMUNITY): Payer: Medicare Other

## 2014-11-27 ENCOUNTER — Encounter (HOSPITAL_COMMUNITY): Payer: Medicare Other

## 2014-11-27 DIAGNOSIS — Z48812 Encounter for surgical aftercare following surgery on the circulatory system: Secondary | ICD-10-CM | POA: Diagnosis not present

## 2014-11-27 DIAGNOSIS — Z953 Presence of xenogenic heart valve: Secondary | ICD-10-CM | POA: Diagnosis not present

## 2014-11-29 ENCOUNTER — Ambulatory Visit (HOSPITAL_COMMUNITY): Payer: Medicare Other

## 2014-11-29 ENCOUNTER — Encounter (HOSPITAL_COMMUNITY): Payer: Medicare Other

## 2014-11-29 ENCOUNTER — Encounter: Payer: Self-pay | Admitting: Internal Medicine

## 2014-11-29 ENCOUNTER — Encounter: Payer: Self-pay | Admitting: *Deleted

## 2014-11-29 ENCOUNTER — Ambulatory Visit (INDEPENDENT_AMBULATORY_CARE_PROVIDER_SITE_OTHER): Payer: Medicare Other | Admitting: Internal Medicine

## 2014-11-29 VITALS — BP 112/78 | HR 58 | Ht 73.5 in | Wt 176.4 lb

## 2014-11-29 DIAGNOSIS — I483 Typical atrial flutter: Secondary | ICD-10-CM

## 2014-11-29 NOTE — Progress Notes (Signed)
Electrophysiology Office Note   Date:  11/29/2014   ID:  Erik [name], DOB September 10, 1935, MRN [id number]  PCP:  Erik Gravel, MD  Cardiologist:  Dr Claiborne Billings Primary Electrophysiologist: Erik Grayer, MD    Chief Complaint  Patient presents with  . Atrial Flutter     History of Present Illness: Erik [name] is a 79 y.o. male who presents today for electrophysiology evaluation.   He underwent aortic valve surgery 4/16.  He did well posteropatively but was noted to have sinus bradycardia and also atrial flutter.  He has since had incessant atrial flutter.  He is anticoagualted with coumadin.  10/09/14 he underwent cardioversion. He reports that he returned to atrial flutter within a week.  He has been in atrial flutter since that time.  He does not have documented afib.   Today, he denies symptoms of palpitations, chest pain, shortness of breath, orthopnea, PND, lower extremity edema, claudication, dizziness, presyncope, syncope, bleeding, or neurologic sequela. The patient is tolerating medications without difficulties and is otherwise without complaint today.    Past Medical History  Diagnosis Date  . Aortic stenosis, severe   . Chronic diastolic congestive heart failure   . S/P aortic valve replacement with bioprosthetic valve 06/30/2014    25 mm [facility] Ease bovine pericardial tissue valve  . Atrial flutter, post-operative 07/27/2014    typical   . Sinus bradycardia    Past Surgical History  Procedure Laterality Date  . Appendectomy    . Hand surgery    . Cataract extraction Bilateral ? 2013    & 2014  . Left and right heart catheterization with coronary angiogram N/A 06/22/2014    Procedure: LEFT AND RIGHT HEART CATHETERIZATION WITH CORONARY ANGIOGRAM;  Surgeon: Erik Sine, MD;  Location: [facility] CATH LAB;  Service: Cardiovascular;  Laterality: N/A;  . Aortic valve replacement N/A 06/30/2014    Procedure: AORTIC VALVE REPLACEMENT (AVR);  Surgeon: Erik Alberts, MD;  Location: [place];  Service: Open Heart Surgery;  Laterality: N/A;  . Intraoperative transesophageal echocardiogram N/A 06/30/2014    Procedure: INTRAOPERATIVE TRANSESOPHAGEAL ECHOCARDIOGRAM;  Surgeon: Erik Alberts, MD;  Location: [place];  Service: Open Heart Surgery;  Laterality: N/A;  . Cardioversion N/A 10/09/2014    Procedure: CARDIOVERSION;  Surgeon: Erik Sine, MD;  Location: [facility] ENDOSCOPY;  Service: Cardiovascular;  Laterality: N/A;   Betsy Pries Ease Pericardial Tissue Valve (size 25 mm, model # 3300TFX, serial # [id number])  Current Outpatient Prescriptions  Medication Sig Dispense Refill  . aspirin EC 81 MG tablet Take 81 mg by mouth daily.    . metoprolol tartrate (LOPRESSOR) 25 MG tablet Take 12.5 mg by mouth daily.     Marland Kitchen warfarin (COUMADIN) 5 MG tablet Take 1 to 1.5 tablets by mouth daily or as directed by coumadin clinic (Patient taking differently: Take 5-7.5 mg by mouth daily. Take 5mg  by mouth on Sunday and take 7.5mg  by mouth on all other days.) 40 tablet 5  . lisinopril (PRINIVIL,ZESTRIL) 5 MG tablet Take 5 mg by mouth daily. He has a prescription for this - but is holding it for now because his blood pressure has been very low. Dr. [name] originally prescribed it. He followed up with Erik [name] and Erik [name] wants to trend his BP for a while to make sure that he is doing okay before adding it back on.     No current facility-administered medications for this visit.    Allergies:   Review of patient's allergies indicates  no known allergies.   Social History:  The patient  reports that he has never smoked. He has never used smokeless tobacco. He reports that he drinks about 3.6 oz of alcohol per week. He reports that he does not use illicit drugs.   Family History:  The patient's  family history includes Cancer in his father; Hypertension in his mother; Stroke in his mother.    ROS:  Please see the history of present illness.   All other systems are reviewed and negative.    PHYSICAL  EXAM: VS:  BP 112/78 mmHg  Pulse 58  Ht 6' 1.5" (1.867 m)  Wt 80.015 kg (176 lb 6.4 oz)  BMI 22.96 kg/m2 , BMI Body mass index is 22.96 kg/(m^2). GEN: Well nourished, well developed, in no acute distress HEENT: normal Neck: no JVD, carotid bruits, or masses Cardiac: iRRR; with crisp S2 Respiratory:  clear to auscultation bilaterally, normal work of breathing GI: soft, nontender, nondistended, + BS MS: no deformity or atrophy Skin: warm and dry  Neuro:  Strength and sensation are intact Psych: euthymic mood, full affect  EKG:  EKG is ordered today. The ekg ordered today shows typical atrial flutter, V reate 58 bpm,  Poor R wave progression   Recent Labs: 06/19/2014: B Natriuretic Peptide 1282.9* 08/22/2014: ALT 13; Magnesium 2.4 10/06/2014: BUN 17; Creat 1.15; Hemoglobin 13.3; Platelets 278; Potassium 5.0; Sodium 140; TSH 2.025    Lipid Panel     Component Value Date/Time   CHOL 135 06/19/2014 2134   TRIG 60 06/19/2014 2134   HDL 35* 06/19/2014 2134   CHOLHDL 3.9 06/19/2014 2134   VLDL 12 06/19/2014 2134   LDLCALC 88 06/19/2014 2134     Wt Readings from Last 3 Encounters:  11/29/14 80.015 kg (176 lb 6.4 oz)  10/26/14 79.2 kg (174 lb 9.7 oz)  10/25/14 79.47 kg (175 lb 3.2 oz)      Other studies Reviewed: Additional studies/ records that were reviewed today include: Dr Erik [name] Erik/[name]'s notes  Review of the above records today demonstrates: as above   ASSESSMENT AND PLAN:  1.  Typical atrial flutter The patient has recurrent typical appearing atrial flutter.  Chads2vasc score is at least 2.  He is appropriately anticoagulated with coumadin. Therapeutic strategies for  Atrial flutter including medicine and ablation were discussed in detail with the patient today. Risk, benefits, and alternatives to EP study and radiofrequency ablation were also discussed in detail today. These risks include but are not limited to stroke, bleeding, vascular damage, tamponade,  perforation, damage to the heart and other structures, AV block requiring pacemaker, worsening renal function, and death. The patient understands these risk and wishes to proceed.  We will therefore proceed with catheter ablation at the next available time.   Current medicines are reviewed at length with the patient today.   The patient does not have concerns regarding his medicines.  The following changes were made today:  none    Signed, Erik Grayer, MD  11/29/2014 9:56 AM     [facility] [address] [place] [address] [phone] (office) [phone] (fax)

## 2014-11-29 NOTE — Patient Instructions (Addendum)
Medication Instructions:  Your physician recommends that you continue on your current medications as directed. Please refer to the Current Medication list given to you today.   Labwork: Your physician recommends that you return for lab work on 12/12/14 at the [address]  office and weekly INR's at [place] office   Testing/Procedures Your physician has recommended that you have an ablation. Catheter ablation is a medical procedure used to treat some cardiac arrhythmias (irregular heartbeats). During catheter ablation, a long, thin, flexible tube is put into a blood vessel in your groin (upper thigh), or neck. This tube is called an ablation catheter. It is then guided to your heart through the blood vessel. Radio frequency waves destroy small areas of heart tissue where abnormal heartbeats may cause an arrhythmia to start. Please see the instruction sheet given to you today.    Follow-Up:  Your physician recommends that you schedule a follow-up appointment in: 4 weeks with Erik Marshall, NP and 3 months with Dr Erik [name]   Any Other Special Instructions Will Be Listed Below (If Applicable).

## 2014-12-01 ENCOUNTER — Ambulatory Visit (INDEPENDENT_AMBULATORY_CARE_PROVIDER_SITE_OTHER): Payer: Medicare Other | Admitting: Pharmacist Clinician (PhC)/ Clinical Pharmacy Specialist

## 2014-12-01 ENCOUNTER — Ambulatory Visit (HOSPITAL_COMMUNITY): Payer: Medicare Other

## 2014-12-01 ENCOUNTER — Encounter (HOSPITAL_COMMUNITY): Payer: Medicare Other

## 2014-12-01 ENCOUNTER — Telehealth (HOSPITAL_COMMUNITY): Payer: Self-pay | Admitting: Internal Medicine

## 2014-12-01 DIAGNOSIS — Z953 Presence of xenogenic heart valve: Secondary | ICD-10-CM

## 2014-12-01 DIAGNOSIS — Z954 Presence of other heart-valve replacement: Secondary | ICD-10-CM

## 2014-12-01 DIAGNOSIS — Z7901 Long term (current) use of anticoagulants: Secondary | ICD-10-CM

## 2014-12-01 LAB — POCT INR: INR: 2.5

## 2014-12-04 ENCOUNTER — Ambulatory Visit (HOSPITAL_COMMUNITY): Payer: Medicare Other

## 2014-12-04 ENCOUNTER — Telehealth (HOSPITAL_COMMUNITY): Payer: Self-pay | Admitting: *Deleted

## 2014-12-04 ENCOUNTER — Encounter (HOSPITAL_COMMUNITY): Payer: Medicare Other

## 2014-12-05 ENCOUNTER — Ambulatory Visit (INDEPENDENT_AMBULATORY_CARE_PROVIDER_SITE_OTHER): Payer: Medicare Other | Admitting: Cardiovascular Disease

## 2014-12-05 ENCOUNTER — Ambulatory Visit (INDEPENDENT_AMBULATORY_CARE_PROVIDER_SITE_OTHER): Payer: Medicare Other | Admitting: *Deleted

## 2014-12-05 ENCOUNTER — Encounter: Payer: Self-pay | Admitting: Cardiovascular Disease

## 2014-12-05 VITALS — BP 118/72 | HR 64 | Ht 73.5 in | Wt 174.8 lb

## 2014-12-05 DIAGNOSIS — I25119 Atherosclerotic heart disease of native coronary artery with unspecified angina pectoris: Secondary | ICD-10-CM

## 2014-12-05 DIAGNOSIS — Z7901 Long term (current) use of anticoagulants: Secondary | ICD-10-CM | POA: Diagnosis not present

## 2014-12-05 DIAGNOSIS — Z954 Presence of other heart-valve replacement: Secondary | ICD-10-CM

## 2014-12-05 DIAGNOSIS — I483 Typical atrial flutter: Secondary | ICD-10-CM

## 2014-12-05 DIAGNOSIS — I480 Paroxysmal atrial fibrillation: Secondary | ICD-10-CM | POA: Diagnosis not present

## 2014-12-05 DIAGNOSIS — Z953 Presence of xenogenic heart valve: Secondary | ICD-10-CM

## 2014-12-05 LAB — POCT INR: INR: 3

## 2014-12-05 MED ORDER — METOPROLOL SUCCINATE ER 25 MG PO TB24: 12.5000 mg | ORAL_TABLET | Freq: Every day | ORAL | Status: DC

## 2014-12-05 NOTE — Progress Notes (Signed)
Patient ID: Qusai Kem, male   DOB: January 25, 1936, 79 y.o.   MRN: [id number]     HPI: Bernhard [place] is a 79 y.o. male who presents to the office today for a follow up cardiology evaluation following his recent aortic valve replacement surgery and development of recurrent atrial flutter.  I initially saw Mr. Iseman in cardiology consultation in April 2016 while in the hospital after he experienced a brief episode of chest discomfort associated with presyncope and transient visual blurring.  Physical examination suggested a murmur of severe aortic stenosis.  This was verified by echo Doppler data, which suggested critical AS.  I performed a right and left heart catheterization 06/22/2014.  He was found to have critical aortic valve stenosis without significant coronary obstructive disease with only a smooth 20% luminal narrowing of the proximal RCA and moderate global LV dysfunction with an EF of 35-40%.  He had upper normal right heart pressures.  He underwent aortic valve replacement surgery by Dr. Roxy Manns using a bioprosthetic tissue valve on 06/30/2014.  Of note, during his hospitalization, he was taken off all beta blockers because of bradycardia.  Ultimately, beta blocker was reinstituted.  He underwent cardioversion on 10/09/2014 after being on Coumadin anticoagulation for over a month.  He was seen on 10/25/2014 by [name], which time he was found to be back in atrial flutter.  He subsequent was referred to Dr. Rayann Heman who saw him on 11/29/2014.  He is tenably scheduled to undergo atrial flutter ablation on 12/19/2014.  He denies any chest pain.  He denies palpitations.  He denies significant shortness of breath. He has been taking metoprolol tartrate only 12.5 mg daily.  He continues to be on Coumadin anticoagulation.  He presents for evaluation  Past Medical History  Diagnosis Date  . Aortic stenosis, severe   . Chronic diastolic congestive heart failure   . S/P aortic valve replacement with  bioprosthetic valve 06/30/2014    25 mm [facility] Ease bovine pericardial tissue valve  . Atrial flutter, post-operative 07/27/2014    typical   . Sinus bradycardia     Past Surgical History  Procedure Laterality Date  . Appendectomy    . Hand surgery    . Cataract extraction Bilateral ? 2013    & 2014  . Left and right heart catheterization with coronary angiogram N/A 06/22/2014    Procedure: LEFT AND RIGHT HEART CATHETERIZATION WITH CORONARY ANGIOGRAM;  Surgeon: Troy Sine, MD;  Location: [facility] CATH LAB;  Service: Cardiovascular;  Laterality: N/A;  . Aortic valve replacement N/A 06/30/2014    Procedure: AORTIC VALVE REPLACEMENT (AVR);  Surgeon: Rexene Alberts, MD;  Location: [place];  Service: Open Heart Surgery;  Laterality: N/A;  . Intraoperative transesophageal echocardiogram N/A 06/30/2014    Procedure: INTRAOPERATIVE TRANSESOPHAGEAL ECHOCARDIOGRAM;  Surgeon: Rexene Alberts, MD;  Location: [place];  Service: Open Heart Surgery;  Laterality: N/A;  . Cardioversion N/A 10/09/2014    Procedure: CARDIOVERSION;  Surgeon: Troy Sine, MD;  Location: [facility] ENDOSCOPY;  Service: Cardiovascular;  Laterality: N/A;    No Known Allergies  Current Outpatient Prescriptions  Medication Sig Dispense Refill  . aspirin EC 81 MG tablet Take 81 mg by mouth daily.    Marland Kitchen warfarin (COUMADIN) 5 MG tablet Take 1 to 1.5 tablets by mouth daily or as directed by coumadin clinic (Patient taking differently: Take 5-7.5 mg by mouth daily. Take 65m by mouth on Sunday and take 7.529mby mouth on all other days.) 40  tablet 5  . metoprolol succinate (TOPROL XL) 25 MG 24 hr tablet Take 0.5 tablets (12.5 mg total) by mouth daily. 15 tablet 6   No current facility-administered medications for this visit.    Social History   Social History  . Marital Status: Married    Spouse Name: N/A  . Number of Children: N/A  . Years of Education: N/A   Occupational History  . Not on file.   Social History Main Topics  .  Smoking status: Never Smoker   . Smokeless tobacco: Never Used     Comment: smoked in college  . Alcohol Use: 3.6 oz/week    6 Cans of beer per week     Comment: occ   . Drug Use: No  . Sexual Activity: Not on file   Other Topics Concern  . Not on file   Social History Narrative   Pt lives in [place] with spouse.  Retired Scientist, physiological.    Family History  Problem Relation Age of Onset  . Stroke Mother   . Hypertension Mother   . Cancer Father     ROS General: Negative; No fevers, chills, or night sweats HEENT: Negative; No changes in vision or hearing, sinus congestion, difficulty swallowing Pulmonary: Negative; No cough, wheezing, shortness of breath, hemoptysis Cardiovascular: See HPI:  GI: Negative; No nausea, vomiting, diarrhea, or abdominal pain GU: Negative; No dysuria, hematuria, or difficulty voiding Musculoskeletal: Negative; no myalgias, joint pain, or weakness Hematologic: Negative; no easy bruising, bleeding Endocrine: Negative; no heat/cold intolerance; no diabetes, Neuro: Negative; no changes in balance, headaches Skin: Negative; No rashes or skin lesions Psychiatric: Negative; No behavioral problems, depression Sleep: Negative; No snoring,  daytime sleepiness, hypersomnolence, bruxism, restless legs, hypnogognic hallucinations. Other comprehensive 14 point system review is negative   Physical Exam BP 118/72 mmHg  Pulse 64  Ht 6' 1.5" (1.867 m)  Wt 174 lb 12.8 oz (79.289 kg)  BMI 22.75 kg/m2 Wt Readings from Last 3 Encounters:  12/05/14 174 lb 12.8 oz (79.289 kg)  11/29/14 176 lb 6.4 oz (80.015 kg)  10/26/14 174 lb 9.7 oz (79.2 kg)   General: Alert, oriented, no distress.  Skin: normal turgor, no rashes, warm and dry HEENT: Normocephalic, atraumatic. Pupils equal round and reactive to light; sclera anicteric; extraocular muscles intact, No lid lag; Nose without nasal septal hypertrophy; Mouth/Parynx benign; Mallinpatti scale 2 Neck: No  JVD, no carotid bruits; normal carotid upstroke Lungs: clear to ausculatation and percussion bilaterally; no wheezing or rales, normal inspiratory and expiratory effort Chest wall: without tenderness to palpitation Heart: PMI not displaced, RRR, s1 s2 normal, 1/6 systolic murmur in the aortic area concordant with his bioprosthesis, No diastolic murmur, no rubs, gallops, thrills, or heaves Abdomen: soft, nontender; no hepatosplenomehaly, BS+; abdominal aorta nontender and not dilated by palpation. Back: no CVA tenderness Pulses: 2+  Musculoskeletal: full range of motion, normal strength, no joint deformities Extremities: Pulses 2+, no clubbing cyanosis or edema, Homan's sign negative  Neurologic: grossly nonfocal; Cranial nerves grossly wnl Psychologic: Normal mood and affect  ECG (independently read by me): Atrial flutter with 4:1 block at 64 bpm.  09/11/2014 ECG (independently read by me): Atrial flutter with 41 block.  One isolated PVC.  Ventricular rate 67 bpm.  Nonspecific ST-T change   ECG (independently read by me): Underlying atrial flutter with a ventricular rate at 88 with frequent PVCs in a bigeminal pattern transiently.  LABS:  BMP Latest Ref Rng 10/06/2014 08/22/2014 07/27/2014  Glucose 65 -  99 mg/dL 90 86 97  BUN 7 - 25 mg/dL _0 Creatinine 0.70 - 1.18 mg/dL 1.15 1.10 1.16  Sodium 135 - 146 mEq/L 140 138 136  Potassium 3.5 - 5.3 mEq/L 5.0 5.0 5.1  Chloride 98 - 110 mEq/L 105 103 102  CO2 20 - 31 mEq/L _1 Calcium 8.6 - 10.3 mg/dL 9.7 9.9 9.8    Hepatic Function Latest Ref Rng 08/22/2014 06/29/2014 06/23/2014  Total Protein 6.0 - 8.3 g/dL 7.7 7.1 6.3  Albumin 3.5 - 5.2 g/dL 4.2 3.9 3.2(L)  AST 0 - 37 U/L _2 ALT 0 - 53 U/L _3 Alk Phosphatase 39 - 117 U/L 79 58 57  Total Bilirubin 0.2 - 1.2 mg/dL 0.8 1.0 1.7(H)    CBC Latest Ref Rng 10/06/2014 07/05/2014 07/03/2014  WBC 4.0 - 10.5 K/uL 5.4 5.9 6.9  Hemoglobin 13.0 - 17.0 g/dL 13.3 8.8(L) 9.0(L)    Hematocrit 39.0 - 52.0 % 37.5(L) 25.2(L) 25.9(L)  Platelets 150 - 400 K/uL 278 206 122(L)   Lab Results  Component Value Date   MCV 95.2 10/06/2014   MCV 102.0* 07/05/2014   MCV 103.6* 07/03/2014    Lab Results  Component Value Date   TSH 2.025 10/06/2014    BNP    Component Value Date/Time   BNP 1282.9* 06/19/2014 1630    ProBNP No results found for: PROBNP   Lipid Panel     Component Value Date/Time   CHOL 135 06/19/2014 2134   TRIG 60 06/19/2014 2134   HDL 35* 06/19/2014 2134   CHOLHDL 3.9 06/19/2014 2134   VLDL 12 06/19/2014 2134   LDLCALC 88 06/19/2014 2134     RADIOLOGY: No results found.    ASSESSMENT AND PLAN: Mr. Kali Ambler is a 79 year old gentleman who was found to have severe symptomatic critical aortic valve stenosis after he presented to the hospital following a presyncopal spell.  He successfully underwent aortic valve replacement surgery on 06/30/2014.  He is on Coumadin therapy.  He had developed atrial flutter and underwent initial cardioversion on 10/09/2014 with successful restoration of sinus rhythm.  Unfortunately, this lasted for slightly more than 1 week.  When again he was found to be back in atrial flutter.  Flutter.  He denies significant shortness of breath.  His INR today is 3.0, and is therapeutic.  He has been taking the total wall tartrate 12.5 daily and I have suggested he change his to metoprolol succinate 12.5 mg daily for more sustained daily coverage.  He's not having any chest pain.  He denies presyncope or syncope.  There are no signs of CHF on exam.  He will be undergoing atrial flutter ablation by Dr. Rayann Heman several weeks and will hold his beta blocker therapy for several days prior to the procedure.  Since he will be having EP follow-up in November and in December, I will see him in March 2017 for Cardiologic follow-up evaluation.  Time spent: 25 minutes  Troy Sine, MD, [facility]  12/05/2014 6:49 PM

## 2014-12-05 NOTE — Patient Instructions (Signed)
Your physician has recommended you make the following change in your medication: your metoprolol has been changed to the long acting formula. This has already been sent to your pharmacy.Marland Kitchen STOP the lopressor that you are currently taking.   Your physician recommends that you schedule a follow-up appointment in: march 2017.

## 2014-12-06 ENCOUNTER — Encounter (HOSPITAL_COMMUNITY)
Admission: RE | Admit: 2014-12-06 | Discharge: 2014-12-06 | Disposition: A | Discharge status: Home / Self Care | Payer: Medicare Other | Source: Ambulatory Visit | Attending: Cardiovascular Disease | Admitting: Cardiovascular Disease

## 2014-12-06 ENCOUNTER — Ambulatory Visit (HOSPITAL_COMMUNITY): Payer: Medicare Other

## 2014-12-06 ENCOUNTER — Encounter (HOSPITAL_COMMUNITY): Payer: Medicare Other

## 2014-12-06 DIAGNOSIS — Z48812 Encounter for surgical aftercare following surgery on the circulatory system: Secondary | ICD-10-CM | POA: Diagnosis not present

## 2014-12-06 DIAGNOSIS — Z953 Presence of xenogenic heart valve: Secondary | ICD-10-CM | POA: Diagnosis not present

## 2014-12-08 ENCOUNTER — Ambulatory Visit (HOSPITAL_COMMUNITY): Payer: Medicare Other

## 2014-12-08 ENCOUNTER — Encounter (HOSPITAL_COMMUNITY)
Admission: RE | Admit: 2014-12-08 | Discharge: 2014-12-08 | Disposition: A | Discharge status: Home / Self Care | Payer: Medicare Other | Source: Ambulatory Visit | Attending: Cardiovascular Disease | Admitting: Cardiovascular Disease

## 2014-12-08 ENCOUNTER — Telehealth: Payer: Self-pay | Admitting: Cardiovascular Disease

## 2014-12-08 ENCOUNTER — Encounter (HOSPITAL_COMMUNITY): Payer: Medicare Other

## 2014-12-08 DIAGNOSIS — Z48812 Encounter for surgical aftercare following surgery on the circulatory system: Secondary | ICD-10-CM | POA: Diagnosis not present

## 2014-12-08 DIAGNOSIS — Z953 Presence of xenogenic heart valve: Secondary | ICD-10-CM | POA: Diagnosis not present

## 2014-12-08 NOTE — Progress Notes (Signed)
Patient reports feeling sluggish since starting his beta blocker was changed to Toprol XL 12.5 mg once a day. Vital signs stable. Patient remains in Atrial Flutter rate 60's to 100. [name] RN at [facility] office called and notified. No complaints voiced today during exercise at cardiac rehab. Will fax exercise flow sheets to Dr. Evette Georges office for review.

## 2014-12-08 NOTE — Telephone Encounter (Signed)
Pt having sluggishness over past few days, he states this is  r/t medication. Has been having this since being recently put on Toprol XL -  Verdis Frederickson notes he is in A Flutter, rate between 60-90. Pt has upcoming appt for ablation w/ Dr. Rayann Heman on 10/4. Reviewed chart, acknowledged known issue. Advised pt to continue med, SE's may linger. Call if other concerns or if HR >100 - may need additional PRN dose but no intervention suggested at this time.

## 2014-12-11 ENCOUNTER — Encounter (HOSPITAL_COMMUNITY): Payer: Medicare Other

## 2014-12-11 ENCOUNTER — Encounter (HOSPITAL_COMMUNITY)
Admission: RE | Admit: 2014-12-11 | Discharge: 2014-12-11 | Disposition: A | Discharge status: Home / Self Care | Payer: Medicare Other | Source: Ambulatory Visit | Attending: Cardiovascular Disease | Admitting: Cardiovascular Disease

## 2014-12-11 ENCOUNTER — Ambulatory Visit (HOSPITAL_COMMUNITY): Payer: Medicare Other

## 2014-12-11 DIAGNOSIS — Z48812 Encounter for surgical aftercare following surgery on the circulatory system: Secondary | ICD-10-CM | POA: Diagnosis not present

## 2014-12-11 DIAGNOSIS — Z953 Presence of xenogenic heart valve: Secondary | ICD-10-CM | POA: Diagnosis not present

## 2014-12-12 ENCOUNTER — Other Ambulatory Visit (INDEPENDENT_AMBULATORY_CARE_PROVIDER_SITE_OTHER): Payer: Medicare Other | Admitting: *Deleted

## 2014-12-12 DIAGNOSIS — I483 Typical atrial flutter: Secondary | ICD-10-CM

## 2014-12-12 LAB — BASIC METABOLIC PANEL
BUN: 14 mg/dL (ref 6–23)
CHLORIDE: 107 meq/L (ref 96–112)
CO2: 29 mEq/L (ref 19–32)
CREATININE: 1.12 mg/dL (ref 0.40–1.50)
Calcium: 9.6 mg/dL (ref 8.4–10.5)
GFR: 67.55 mL/min (ref >60.00)
Glucose, Bld: 97 mg/dL (ref 70–99)
Potassium: 4.9 mEq/L (ref 3.5–5.1)
Sodium: 141 mEq/L (ref 135–145)

## 2014-12-12 LAB — CBC WITH DIFFERENTIAL/PLATELET
BASOS PCT: 0.4 % (ref 0.0–3.0)
Basophils Absolute: 0 10*3/uL (ref 0.0–0.1)
EOS ABS: 0.2 10*3/uL (ref 0.0–0.7)
Eosinophils Relative: 2.5 % (ref 0.0–5.0)
HCT: 36.5 % — ABNORMAL LOW (ref 39.0–52.0)
HEMOGLOBIN: 12.4 g/dL — AB (ref 13.0–17.0)
LYMPHS ABS: 1.3 10*3/uL (ref 0.7–4.0)
Lymphocytes Relative: 21.5 % (ref 12.0–46.0)
MCHC: 33.8 g/dL (ref 30.0–36.0)
MCV: 93.6 fl (ref 78.0–100.0)
MONO ABS: 0.4 10*3/uL (ref 0.1–1.0)
Monocytes Relative: 6.7 % (ref 3.0–12.0)
NEUTROS PCT: 68.9 % (ref 43.0–77.0)
Neutro Abs: 4.2 10*3/uL (ref 1.4–7.7)
PLATELETS: 265 10*3/uL (ref 150.0–400.0)
RBC: 3.9 Mil/uL — ABNORMAL LOW (ref 4.22–5.81)
RDW: 14.5 % (ref 11.5–15.5)
WBC: 6.2 10*3/uL (ref 4.0–10.5)

## 2014-12-12 LAB — PROTIME-INR
INR: 2.9 ratio — AB (ref 0.8–1.0)
PROTHROMBIN TIME: 31.5 s — AB (ref 9.6–13.1)

## 2014-12-13 ENCOUNTER — Telehealth: Payer: Self-pay | Admitting: Internal Medicine

## 2014-12-13 ENCOUNTER — Encounter (HOSPITAL_COMMUNITY)
Admission: RE | Admit: 2014-12-13 | Discharge: 2014-12-13 | Disposition: A | Discharge status: Home / Self Care | Payer: Medicare Other | Source: Ambulatory Visit | Attending: Cardiovascular Disease | Admitting: Cardiovascular Disease

## 2014-12-13 ENCOUNTER — Encounter (HOSPITAL_COMMUNITY): Payer: Medicare Other

## 2014-12-13 ENCOUNTER — Ambulatory Visit (HOSPITAL_COMMUNITY): Payer: Medicare Other

## 2014-12-13 ENCOUNTER — Ambulatory Visit (INDEPENDENT_AMBULATORY_CARE_PROVIDER_SITE_OTHER): Payer: Medicare Other | Admitting: Pharmacist Clinician (PhC)/ Clinical Pharmacy Specialist

## 2014-12-13 DIAGNOSIS — Z953 Presence of xenogenic heart valve: Secondary | ICD-10-CM

## 2014-12-13 DIAGNOSIS — Z7901 Long term (current) use of anticoagulants: Secondary | ICD-10-CM

## 2014-12-13 DIAGNOSIS — Z954 Presence of other heart-valve replacement: Secondary | ICD-10-CM | POA: Diagnosis not present

## 2014-12-13 DIAGNOSIS — Z48812 Encounter for surgical aftercare following surgery on the circulatory system: Secondary | ICD-10-CM | POA: Diagnosis not present

## 2014-12-13 LAB — POCT INR: INR: 2.6

## 2014-12-13 NOTE — Telephone Encounter (Signed)
Spoke with patient's wife and Dr Claiborne Billings changed his medication from Metoprolol Tartrate 12.5mg   daily to Metoprolol Succinate 12.5mg  daily.  All instructions the same for his medications for procedure

## 2014-12-13 NOTE — Telephone Encounter (Signed)
New message     Talk to a nurse regarding upcoming ablation

## 2014-12-15 ENCOUNTER — Ambulatory Visit (HOSPITAL_COMMUNITY): Payer: Medicare Other

## 2014-12-15 ENCOUNTER — Encounter (HOSPITAL_COMMUNITY)
Admission: RE | Admit: 2014-12-15 | Discharge: 2014-12-15 | Disposition: A | Discharge status: Home / Self Care | Payer: Medicare Other | Source: Ambulatory Visit | Attending: Cardiovascular Disease | Admitting: Cardiovascular Disease

## 2014-12-15 ENCOUNTER — Encounter (HOSPITAL_COMMUNITY): Payer: Medicare Other

## 2014-12-15 DIAGNOSIS — Z953 Presence of xenogenic heart valve: Secondary | ICD-10-CM | POA: Diagnosis not present

## 2014-12-15 DIAGNOSIS — Z48812 Encounter for surgical aftercare following surgery on the circulatory system: Secondary | ICD-10-CM | POA: Diagnosis not present

## 2014-12-18 ENCOUNTER — Encounter (HOSPITAL_COMMUNITY): Payer: Medicare Other

## 2014-12-18 ENCOUNTER — Telehealth (HOSPITAL_COMMUNITY): Payer: Self-pay | Admitting: *Deleted

## 2014-12-18 ENCOUNTER — Ambulatory Visit (HOSPITAL_COMMUNITY): Payer: Medicare Other

## 2014-12-19 ENCOUNTER — Ambulatory Visit (HOSPITAL_COMMUNITY): Payer: Medicare Other | Admitting: Anesthesiology

## 2014-12-19 ENCOUNTER — Encounter (HOSPITAL_COMMUNITY): Payer: Self-pay | Admitting: Certified Registered Nurse Anesthetist

## 2014-12-19 ENCOUNTER — Ambulatory Visit (HOSPITAL_COMMUNITY)
Admission: RE | Admit: 2014-12-19 | Discharge: 2014-12-19 | Disposition: A | Discharge status: Home / Self Care | Payer: Medicare Other | Source: Ambulatory Visit | Attending: Internal Medicine | Admitting: Internal Medicine

## 2014-12-19 ENCOUNTER — Encounter (HOSPITAL_COMMUNITY)
Admission: RE | Disposition: A | Discharge status: Home / Self Care | Payer: Self-pay | Source: Ambulatory Visit | Attending: Internal Medicine

## 2014-12-19 DIAGNOSIS — I4892 Unspecified atrial flutter: Secondary | ICD-10-CM | POA: Diagnosis not present

## 2014-12-19 DIAGNOSIS — Z7901 Long term (current) use of anticoagulants: Secondary | ICD-10-CM | POA: Diagnosis not present

## 2014-12-19 DIAGNOSIS — Z953 Presence of xenogenic heart valve: Secondary | ICD-10-CM

## 2014-12-19 DIAGNOSIS — Z7982 Long term (current) use of aspirin: Secondary | ICD-10-CM | POA: Insufficient documentation

## 2014-12-19 DIAGNOSIS — I5032 Chronic diastolic (congestive) heart failure: Secondary | ICD-10-CM | POA: Diagnosis not present

## 2014-12-19 DIAGNOSIS — I483 Typical atrial flutter: Secondary | ICD-10-CM | POA: Diagnosis present

## 2014-12-19 HISTORY — PX: ELECTROPHYSIOLOGIC STUDY: SHX172A

## 2014-12-19 LAB — PROTIME-INR
INR: 2.44 — AB (ref 0.00–1.49)
Prothrombin Time: 26.2 seconds — ABNORMAL HIGH (ref 11.6–15.2)

## 2014-12-19 SURGERY — A-FLUTTER/A-TACH/SVT ABLATION
Anesthesia: General

## 2014-12-19 MED ORDER — BUPIVACAINE HCL (PF) 0.25 % IJ SOLN
INTRAMUSCULAR | Status: DC | PRN
  Administered 2014-12-19: 15 mL

## 2014-12-19 MED ORDER — ONDANSETRON HCL 4 MG/2ML IJ SOLN: 4.0000 mg | Freq: Four times a day (QID) | INTRAMUSCULAR | Status: DC | PRN

## 2014-12-19 MED ORDER — WARFARIN SODIUM 5 MG PO TABS
5.0000 mg | ORAL_TABLET | Freq: Once | ORAL | Status: DC
  Filled 2014-12-19: qty 1

## 2014-12-19 MED ORDER — LACTATED RINGERS IV SOLN
INTRAVENOUS | Status: DC | PRN
  Administered 2014-12-19: 10:00:00 via INTRAVENOUS

## 2014-12-19 MED ORDER — OFF THE BEAT BOOK
Freq: Once | Status: AC
  Administered 2014-12-19: 18:00:00
  Filled 2014-12-19: qty 1

## 2014-12-19 MED ORDER — LIDOCAINE HCL (PF) 1 % IJ SOLN: INTRAMUSCULAR | Status: DC | PRN

## 2014-12-19 MED ORDER — FENTANYL CITRATE (PF) 100 MCG/2ML IJ SOLN
INTRAMUSCULAR | Status: DC | PRN
  Administered 2014-12-19: 50 ug via INTRAVENOUS

## 2014-12-19 MED ORDER — ONDANSETRON HCL 4 MG/2ML IJ SOLN: 4.0000 mg | Freq: Once | INTRAMUSCULAR | Status: DC | PRN

## 2014-12-19 MED ORDER — FENTANYL CITRATE (PF) 100 MCG/2ML IJ SOLN: 25.0000 ug | INTRAMUSCULAR | Status: DC | PRN

## 2014-12-19 MED ORDER — PROPOFOL 10 MG/ML IV BOLUS
INTRAVENOUS | Status: DC | PRN
  Administered 2014-12-19: 150 mg via INTRAVENOUS
  Administered 2014-12-19: 50 mg via INTRAVENOUS

## 2014-12-19 MED ORDER — BUPIVACAINE HCL (PF) 0.25 % IJ SOLN
INTRAMUSCULAR | Status: AC
  Filled 2014-12-19: qty 30

## 2014-12-19 MED ORDER — SODIUM CHLORIDE 0.9 % IV SOLN
INTRAVENOUS | Status: DC
  Administered 2014-12-19: 09:00:00 via INTRAVENOUS

## 2014-12-19 MED ORDER — ONDANSETRON HCL 4 MG/2ML IJ SOLN
INTRAMUSCULAR | Status: DC | PRN
  Administered 2014-12-19: 4 mg via INTRAVENOUS

## 2014-12-19 MED ORDER — LIDOCAINE HCL (CARDIAC) 20 MG/ML IV SOLN
INTRAVENOUS | Status: DC | PRN
  Administered 2014-12-19: 100 mg via INTRAVENOUS

## 2014-12-19 MED ORDER — ACETAMINOPHEN 325 MG PO TABS: 650.0000 mg | ORAL_TABLET | ORAL | Status: DC | PRN

## 2014-12-19 MED ORDER — SODIUM CHLORIDE 0.9 % IJ SOLN: 3.0000 mL | INTRAMUSCULAR | Status: DC | PRN

## 2014-12-19 MED ORDER — WARFARIN - PHYSICIAN DOSING INPATIENT: Freq: Every day | Status: DC

## 2014-12-19 MED ORDER — MIDAZOLAM HCL 5 MG/5ML IJ SOLN
INTRAMUSCULAR | Status: DC | PRN
  Administered 2014-12-19: 1 mg via INTRAVENOUS

## 2014-12-19 MED ORDER — HYDROCODONE-ACETAMINOPHEN 5-325 MG PO TABS: 1.0000 | ORAL_TABLET | ORAL | Status: DC | PRN

## 2014-12-19 MED ORDER — SODIUM CHLORIDE 0.9 % IJ SOLN
3.0000 mL | Freq: Two times a day (BID) | INTRAMUSCULAR | Status: DC
  Administered 2014-12-19: 15:00:00 3 mL via INTRAVENOUS

## 2014-12-19 MED ORDER — SODIUM CHLORIDE 0.9 % IV SOLN: 250.0000 mL | INTRAVENOUS | Status: DC | PRN

## 2014-12-19 SURGICAL SUPPLY — 12 items
BAG SNAP BAND KOVER 36X36 (MISCELLANEOUS) ×2 IMPLANT
BLANKET WARM UNDERBOD FULL ACC (MISCELLANEOUS) ×2 IMPLANT
CATH BLAZERPRIME XP LG CV 10MM (ABLATOR) ×1 IMPLANT
CATH DUODECA HALO/ISMUS 7FR (CATHETERS) ×1 IMPLANT
CATH JOSEPHSON QUAD-ALLRED 6FR (CATHETERS) ×1 IMPLANT
CATH WEB BIDIR CS D-F NONAUTO (CATHETERS) ×1 IMPLANT
PACK EP LATEX FREE (CUSTOM PROCEDURE TRAY) ×2
PACK EP LF (CUSTOM PROCEDURE TRAY) ×1 IMPLANT
PAD DEFIB LIFELINK (PAD) ×2 IMPLANT
SHEATH PINNACLE 7F 10CM (SHEATH) ×3 IMPLANT
SHEATH PINNACLE 8F 10CM (SHEATH) ×2 IMPLANT
SHIELD RADPAD SCOOP 12X17 (MISCELLANEOUS) ×2 IMPLANT

## 2014-12-19 NOTE — Transfer of Care (Signed)
Immediate Anesthesia Transfer of Care Note  Patient: Erik [name]  Procedure(s) Performed: Procedure(s): A-Flutter Ablation (N/A)  Patient Location: PACU  Anesthesia Type:General  Level of Consciousness: awake, patient cooperative and responds to stimulation  Airway & Oxygen Therapy: Patient Spontanous Breathing and Patient connected to nasal cannula oxygen  Post-op Assessment: Report given to RN, Post -op Vital signs reviewed and stable and Patient moving all extremities X 4  Post vital signs: Reviewed and stable  Last Vitals:  Filed Vitals:   12/19/14 0818  BP: 128/66  Pulse: 105  Temp: 36.6 C  Resp: 18    Complications: No apparent anesthesia complications

## 2014-12-19 NOTE — Discharge Instructions (Signed)
No driving for 3 days. No lifting over 5 lbs for 1 week. No sexual activity for 1 week. Keep procedure site clean & dry. If you notice increased pain, swelling, bleeding or pus, call/return! You may shower, but no soaking baths/hot tubs/pools for 1 week. ° °

## 2014-12-19 NOTE — Anesthesia Preprocedure Evaluation (Addendum)
Anesthesia Evaluation  Patient identified by MRN, date of birth, ID band Patient awake    Reviewed: Allergy & Precautions, NPO status , Patient's Chart, lab work & pertinent test results  History of Anesthesia Complications Negative for: history of anesthetic complications  Airway Mallampati: II  TM Distance: >3 FB Neck ROM: Full    Dental no notable dental hx. (+) Dental Advisory Given   Pulmonary neg pulmonary ROS,    Pulmonary exam normal breath sounds clear to auscultation       Cardiovascular Normal cardiovascular exam+ dysrhythmias Atrial Fibrillation  Rhythm:Regular Rate:Normal  H/o severe AS, s/p AVR 4/16   Neuro/Psych negative neurological ROS  negative psych ROS   GI/Hepatic negative GI ROS, Neg liver ROS,   Endo/Other  negative endocrine ROS  Renal/GU negative Renal ROS  negative genitourinary   Musculoskeletal negative musculoskeletal ROS (+)   Abdominal   Peds negative pediatric ROS (+)  Hematology negative hematology ROS (+)   Anesthesia Other Findings   Reproductive/Obstetrics negative OB ROS                            Anesthesia Physical Anesthesia Plan  ASA: III  Anesthesia Plan: General   Post-op Pain Management:    Induction: Intravenous  Airway Management Planned: LMA  Additional Equipment:   Intra-op Plan:   Post-operative Plan: Extubation in OR  Informed Consent: I have reviewed the patients History and Physical, chart, labs and discussed the procedure including the risks, benefits and alternatives for the proposed anesthesia with the patient or authorized representative who has indicated his/her understanding and acceptance.   Dental advisory given  Plan Discussed with:   Anesthesia Plan Comments:        Anesthesia Quick Evaluation

## 2014-12-19 NOTE — Progress Notes (Signed)
Doing well s/p ablation No concerns Sinus bradycardia noted but asymptomatic  Will DC to home at 1900 Stop metoprolol Continue anticoagulation with follow-up as already scheduled  Follow-up with me in 4 weeks  Thompson Grayer MD, [facility] 12/19/2014 4:12 PM

## 2014-12-19 NOTE — Progress Notes (Addendum)
Site area: Rt fem vein . 3 sheaths venous  Site Prior to Removal:  Level 0  Pressure Applied For: 25 min Manual:   Manual hold Patient Status During Pull:  A/O Post Pull Site:  Level 0 Post Pull Instructions Given:  Post instructions given and pt understands Post Pull Pulses Present: 2+ rt dp/pt Dressing Applied:  tegaderm and a 4x4 Bedrest begins @ 13:35:00 Comments: Rt groin unremarkable. Dressing is clean, dry and intact.

## 2014-12-19 NOTE — H&P (View-Only) (Signed)
Electrophysiology Office Note   Date:  11/29/2014   ID:  Erik [name], DOB [birth year], MRN [id number]  PCP:  Jani Gravel, MD  Cardiologist:  Dr Claiborne Billings Primary Electrophysiologist: Thompson Grayer, MD    Chief Complaint  Patient presents with  . Atrial Flutter     History of Present Illness: Erik [name] is a 79 y.o. male who presents today for electrophysiology evaluation.   He underwent aortic valve surgery 4/16.  He did well posteropatively but was noted to have sinus bradycardia and also atrial flutter.  He has since had incessant atrial flutter.  He is anticoagualted with coumadin.  10/09/14 he underwent cardioversion. He reports that he returned to atrial flutter within a week.  He has been in atrial flutter since that time.  He does not have documented afib.   Today, he denies symptoms of palpitations, chest pain, shortness of breath, orthopnea, PND, lower extremity edema, claudication, dizziness, presyncope, syncope, bleeding, or neurologic sequela. The patient is tolerating medications without difficulties and is otherwise without complaint today.    Past Medical History  Diagnosis Date  . Aortic stenosis, severe   . Chronic diastolic congestive heart failure   . S/P aortic valve replacement with bioprosthetic valve 06/30/2014    25 mm [facility] Ease bovine pericardial tissue valve  . Atrial flutter, post-operative 07/27/2014    typical   . Sinus bradycardia    Past Surgical History  Procedure Laterality Date  . Appendectomy    . Hand surgery    . Cataract extraction Bilateral ? 2013    & 2014  . Left and right heart catheterization with coronary angiogram N/A 06/22/2014    Procedure: LEFT AND RIGHT HEART CATHETERIZATION WITH CORONARY ANGIOGRAM;  Surgeon: Troy Sine, MD;  Location: [facility] CATH LAB;  Service: Cardiovascular;  Laterality: N/A;  . Aortic valve replacement N/A 06/30/2014    Procedure: AORTIC VALVE REPLACEMENT (AVR);  Surgeon: Rexene Alberts, MD;  Location: [place];  Service: Open Heart Surgery;  Laterality: N/A;  . Intraoperative transesophageal echocardiogram N/A 06/30/2014    Procedure: INTRAOPERATIVE TRANSESOPHAGEAL ECHOCARDIOGRAM;  Surgeon: Rexene Alberts, MD;  Location: [place];  Service: Open Heart Surgery;  Laterality: N/A;  . Cardioversion N/A 10/09/2014    Procedure: CARDIOVERSION;  Surgeon: Troy Sine, MD;  Location: [facility] ENDOSCOPY;  Service: Cardiovascular;  Laterality: N/A;   Betsy Pries Ease Pericardial Tissue Valve (size 25 mm, model # 3300TFX, serial # [id number])  Current Outpatient Prescriptions  Medication Sig Dispense Refill  . aspirin EC 81 MG tablet Take 81 mg by mouth daily.    . metoprolol tartrate (LOPRESSOR) 25 MG tablet Take 12.5 mg by mouth daily.     Marland Kitchen warfarin (COUMADIN) 5 MG tablet Take 1 to 1.5 tablets by mouth daily or as directed by coumadin clinic (Patient taking differently: Take 5-7.5 mg by mouth daily. Take 5mg  by mouth on Sunday and take 7.5mg  by mouth on all other days.) 40 tablet 5  . lisinopril (PRINIVIL,ZESTRIL) 5 MG tablet Take 5 mg by mouth daily. He has a prescription for this - but is holding it for now because his blood pressure has been very low. Dr. Roxy Manns originally prescribed it. He followed up with Aaron Edelman and Aaron Edelman wants to trend his BP for a while to make sure that he is doing okay before adding it back on.     No current facility-administered medications for this visit.    Allergies:   Review of patient's allergies indicates  no known allergies.   Social History:  The patient  reports that he has never smoked. He has never used smokeless tobacco. He reports that he drinks about 3.6 oz of alcohol per week. He reports that he does not use illicit drugs.   Family History:  The patient's  family history includes Cancer in his father; Hypertension in his mother; Stroke in his mother.    ROS:  Please see the history of present illness.   All other systems are reviewed and negative.    PHYSICAL  EXAM: VS:  BP 112/78 mmHg  Pulse 58  Ht 6' 1.5" (1.867 m)  Wt 80.015 kg (176 lb 6.4 oz)  BMI 22.96 kg/m2 , BMI Body mass index is 22.96 kg/(m^2). GEN: Well nourished, well developed, in no acute distress HEENT: normal Neck: no JVD, carotid bruits, or masses Cardiac: iRRR; with crisp S2 Respiratory:  clear to auscultation bilaterally, normal work of breathing GI: soft, nontender, nondistended, + BS MS: no deformity or atrophy Skin: warm and dry  Neuro:  Strength and sensation are intact Psych: euthymic mood, full affect  EKG:  EKG is ordered today. The ekg ordered today shows typical atrial flutter, V reate 58 bpm,  Poor R wave progression   Recent Labs: 06/19/2014: B Natriuretic Peptide 1282.9* 08/22/2014: ALT 13; Magnesium 2.4 10/06/2014: BUN 17; Creat 1.15; Hemoglobin 13.3; Platelets 278; Potassium 5.0; Sodium 140; TSH 2.025    Lipid Panel     Component Value Date/Time   CHOL 135 06/19/2014 2134   TRIG 60 06/19/2014 2134   HDL 35* 06/19/2014 2134   CHOLHDL 3.9 06/19/2014 2134   VLDL 12 06/19/2014 2134   LDLCALC 88 06/19/2014 2134     Wt Readings from Last 3 Encounters:  11/29/14 80.015 kg (176 lb 6.4 oz)  10/26/14 79.2 kg (174 lb 9.7 oz)  10/25/14 79.47 kg (175 lb 3.2 oz)      Other studies Reviewed: Additional studies/ records that were reviewed today include: Dr Roxy Manns [facility] notes  Review of the above records today demonstrates: as above   ASSESSMENT AND PLAN:  1.  Typical atrial flutter The patient has recurrent typical appearing atrial flutter.  Chads2vasc score is at least 2.  He is appropriately anticoagulated with coumadin. Therapeutic strategies for  Atrial flutter including medicine and ablation were discussed in detail with the patient today. Risk, benefits, and alternatives to EP study and radiofrequency ablation were also discussed in detail today. These risks include but are not limited to stroke, bleeding, vascular damage, tamponade,  perforation, damage to the heart and other structures, AV block requiring pacemaker, worsening renal function, and death. The patient understands these risk and wishes to proceed.  We will therefore proceed with catheter ablation at the next available time.   Current medicines are reviewed at length with the patient today.   The patient does not have concerns regarding his medicines.  The following changes were made today:  none    Signed, Thompson Grayer, MD  11/29/2014 9:56 AM     [facility] [address] [place] [address] [phone] (office) [phone] (fax)

## 2014-12-19 NOTE — Anesthesia Procedure Notes (Signed)
Procedure Name: LMA Insertion Performed by: Judeth Cornfield T Pre-anesthesia Checklist: Patient identified, Timeout performed, Emergency Drugs available, Suction available and Patient being monitored Patient Re-evaluated:Patient Re-evaluated prior to inductionOxygen Delivery Method: Circle system utilized Preoxygenation: Pre-oxygenation with 100% oxygen Intubation Type: IV induction LMA: LMA inserted LMA Size: 4.0 Number of attempts: 1 Placement Confirmation: breath sounds checked- equal and bilateral and positive ETCO2 Tube secured with: Tape Dental Injury: Teeth and Oropharynx as per pre-operative assessment

## 2014-12-19 NOTE — Interval H&P Note (Signed)
History and Physical Interval Note:  12/19/2014 10:41 AM  Erik [name]  has presented today for surgery, with the diagnosis of aflutter  The various methods of treatment have been discussed with the patient and family. After consideration of risks, benefits and other options for treatment, the patient has consented to  Procedure(s): A-Flutter Ablation (N/A) as a surgical intervention .  The patient's history has been reviewed, patient examined, no change in status, stable for surgery.  I have reviewed the patient's chart and labs.  Questions were answered to the patient's satisfaction.     Thompson Grayer

## 2014-12-19 NOTE — Anesthesia Postprocedure Evaluation (Signed)
  Anesthesia Post-op Note  Patient: Erik [name]  Procedure(s) Performed: Procedure(s) (LRB): A-Flutter Ablation (N/A)  Patient Location: PACU  Anesthesia Type: General  Level of Consciousness: awake and alert   Airway and Oxygen Therapy: Patient Spontanous Breathing  Post-op Pain: mild  Post-op Assessment: Post-op Vital signs reviewed, Patient's Cardiovascular Status Stable, Respiratory Function Stable, Patent Airway and No signs of Nausea or vomiting  Last Vitals:  Filed Vitals:   12/19/14 1415  BP: 108/40  Pulse: 54  Temp: 36.7 C  Resp: 15    Post-op Vital Signs: stable   Complications: No apparent anesthesia complications

## 2014-12-20 ENCOUNTER — Encounter (HOSPITAL_COMMUNITY): Payer: Medicare Other

## 2014-12-20 ENCOUNTER — Encounter (HOSPITAL_COMMUNITY): Admission: RE | Admit: 2014-12-20 | Payer: Medicare Other | Source: Ambulatory Visit

## 2014-12-20 ENCOUNTER — Ambulatory Visit (HOSPITAL_COMMUNITY): Payer: Medicare Other

## 2014-12-20 ENCOUNTER — Encounter (HOSPITAL_COMMUNITY): Payer: Self-pay | Admitting: Internal Medicine

## 2014-12-22 ENCOUNTER — Encounter (HOSPITAL_COMMUNITY): Payer: Medicare Other

## 2014-12-22 ENCOUNTER — Ambulatory Visit (HOSPITAL_COMMUNITY): Payer: Medicare Other

## 2014-12-25 ENCOUNTER — Encounter (HOSPITAL_COMMUNITY): Payer: Medicare Other

## 2014-12-25 ENCOUNTER — Ambulatory Visit: Payer: Medicare Other | Admitting: Nurse Practitioner

## 2014-12-25 ENCOUNTER — Ambulatory Visit (HOSPITAL_COMMUNITY): Payer: Medicare Other

## 2014-12-26 ENCOUNTER — Telehealth (HOSPITAL_COMMUNITY): Payer: Self-pay | Admitting: *Deleted

## 2014-12-26 ENCOUNTER — Telehealth: Payer: Self-pay | Admitting: Internal Medicine

## 2014-12-26 NOTE — Telephone Encounter (Signed)
New Message   Pt wife wants a note faxed over to the Rehab for the pt to return  [phone] for cardiac rehab  It wife want to talk to you about pt heart rate

## 2014-12-26 NOTE — Telephone Encounter (Signed)
Dr Rayann Heman needs to send a note to [facility] at Cardiac rehab stating he can return to rehab.  HR's have been on avg 53.  He is due to go back tomorrow(M/W/F).  He says feels "sluggish", denies SOB, BP 119/55.  Just tired every day.  He has been walking daily 30-45 min daily.  Let her know it may be Mon before Dr Rayann Heman addresses this  She was fine with that

## 2014-12-27 ENCOUNTER — Encounter (HOSPITAL_COMMUNITY): Payer: Medicare Other

## 2014-12-27 ENCOUNTER — Telehealth (HOSPITAL_COMMUNITY): Payer: Self-pay | Admitting: *Deleted

## 2014-12-27 ENCOUNTER — Ambulatory Visit (INDEPENDENT_AMBULATORY_CARE_PROVIDER_SITE_OTHER): Payer: Medicare Other | Admitting: Pharmacist

## 2014-12-27 ENCOUNTER — Ambulatory Visit: Payer: Medicare Other | Admitting: Internal Medicine

## 2014-12-27 DIAGNOSIS — Z953 Presence of xenogenic heart valve: Secondary | ICD-10-CM

## 2014-12-27 DIAGNOSIS — Z7901 Long term (current) use of anticoagulants: Secondary | ICD-10-CM | POA: Diagnosis not present

## 2014-12-27 DIAGNOSIS — Z954 Presence of other heart-valve replacement: Secondary | ICD-10-CM | POA: Diagnosis not present

## 2014-12-27 LAB — POCT INR: INR: 2.6

## 2014-12-27 NOTE — Telephone Encounter (Signed)
-----   Message from Thompson Grayer, MD sent at 12/26/2014  8:28 PM EDT ----- Regarding: RE: return to cardiac rehab Ok to resume rehab  Thanks!  ----- Message -----    From: Magda Kiel, RN    Sent: 12/26/2014  11:30 AM      To: Thompson Grayer, MD Subject: return to cardiac rehab                        Good morning Dr Rayann Heman,  Is it okay for Mr Coye to resume exercise tomorrow at cardiac rehab?   Thanks for your input!   Barnet Pall RN  Cardiac rehab

## 2014-12-29 ENCOUNTER — Encounter (HOSPITAL_COMMUNITY)
Admission: RE | Admit: 2014-12-29 | Discharge: 2014-12-29 | Disposition: A | Discharge status: Home / Self Care | Payer: Medicare Other | Source: Ambulatory Visit | Attending: Cardiovascular Disease | Admitting: Cardiovascular Disease

## 2014-12-29 ENCOUNTER — Telehealth: Payer: Self-pay | Admitting: Cardiovascular Disease

## 2014-12-29 ENCOUNTER — Encounter (HOSPITAL_COMMUNITY): Payer: Medicare Other

## 2014-12-29 ENCOUNTER — Other Ambulatory Visit: Payer: Self-pay

## 2014-12-29 ENCOUNTER — Ambulatory Visit (HOSPITAL_COMMUNITY)
Admission: RE | Admit: 2014-12-29 | Discharge: 2014-12-29 | Disposition: A | Discharge status: Home / Self Care | Payer: Medicare Other | Source: Ambulatory Visit | Attending: Internal Medicine | Admitting: Internal Medicine

## 2014-12-29 DIAGNOSIS — Z953 Presence of xenogenic heart valve: Secondary | ICD-10-CM | POA: Diagnosis not present

## 2014-12-29 DIAGNOSIS — Z48812 Encounter for surgical aftercare following surgery on the circulatory system: Secondary | ICD-10-CM | POA: Insufficient documentation

## 2014-12-29 DIAGNOSIS — I483 Typical atrial flutter: Secondary | ICD-10-CM

## 2014-12-29 NOTE — Progress Notes (Signed)
Patient returned to exercise today per Dr Rayann Heman. Vital signs stable. Telemetry rhythm Sinus brady, Sinus rhythm with a questionable biphasic P wave first degree heart block. Dr Evette Georges office called and notified. Spoke with Elly Modena LPN. 12 lead ECG obtained confirmed Sinus Loletha Grayer with a first degree heart block. Will continue to monitor the patient throughout  the program.

## 2014-12-29 NOTE — Telephone Encounter (Signed)
Received a call from [facility] [place] requesting a order for EKG.EKG order put in.

## 2015-01-01 ENCOUNTER — Encounter (HOSPITAL_COMMUNITY): Payer: Medicare Other

## 2015-01-03 ENCOUNTER — Encounter (HOSPITAL_COMMUNITY)
Admission: RE | Admit: 2015-01-03 | Discharge: 2015-01-03 | Disposition: A | Discharge status: Home / Self Care | Payer: Medicare Other | Source: Ambulatory Visit | Attending: Cardiovascular Disease | Admitting: Cardiovascular Disease

## 2015-01-03 ENCOUNTER — Encounter (HOSPITAL_COMMUNITY): Payer: Medicare Other

## 2015-01-03 DIAGNOSIS — Z48812 Encounter for surgical aftercare following surgery on the circulatory system: Secondary | ICD-10-CM | POA: Diagnosis not present

## 2015-01-03 DIAGNOSIS — Z953 Presence of xenogenic heart valve: Secondary | ICD-10-CM | POA: Diagnosis not present

## 2015-01-05 ENCOUNTER — Encounter (HOSPITAL_COMMUNITY): Payer: Medicare Other

## 2015-01-05 ENCOUNTER — Encounter (HOSPITAL_COMMUNITY)
Admission: RE | Admit: 2015-01-05 | Discharge: 2015-01-05 | Disposition: A | Discharge status: Home / Self Care | Payer: Medicare Other | Source: Ambulatory Visit | Attending: Cardiovascular Disease | Admitting: Cardiovascular Disease

## 2015-01-05 DIAGNOSIS — Z953 Presence of xenogenic heart valve: Secondary | ICD-10-CM | POA: Diagnosis not present

## 2015-01-05 DIAGNOSIS — Z48812 Encounter for surgical aftercare following surgery on the circulatory system: Secondary | ICD-10-CM | POA: Diagnosis not present

## 2015-01-08 ENCOUNTER — Encounter (HOSPITAL_COMMUNITY): Payer: Medicare Other

## 2015-01-08 ENCOUNTER — Encounter (HOSPITAL_COMMUNITY)
Admission: RE | Admit: 2015-01-08 | Discharge: 2015-01-08 | Disposition: A | Discharge status: Home / Self Care | Payer: Medicare Other | Source: Ambulatory Visit | Attending: Cardiovascular Disease | Admitting: Cardiovascular Disease

## 2015-01-08 DIAGNOSIS — Z953 Presence of xenogenic heart valve: Secondary | ICD-10-CM | POA: Diagnosis not present

## 2015-01-08 DIAGNOSIS — Z48812 Encounter for surgical aftercare following surgery on the circulatory system: Secondary | ICD-10-CM | POA: Diagnosis not present

## 2015-01-10 ENCOUNTER — Encounter (HOSPITAL_COMMUNITY): Payer: Medicare Other

## 2015-01-11 ENCOUNTER — Encounter: Payer: Self-pay | Admitting: Cardiovascular Disease

## 2015-01-12 ENCOUNTER — Encounter (HOSPITAL_COMMUNITY)
Admission: RE | Admit: 2015-01-12 | Discharge: 2015-01-12 | Disposition: A | Discharge status: Home / Self Care | Payer: Medicare Other | Source: Ambulatory Visit | Attending: Cardiovascular Disease | Admitting: Cardiovascular Disease

## 2015-01-12 ENCOUNTER — Encounter (HOSPITAL_COMMUNITY): Payer: Medicare Other

## 2015-01-12 DIAGNOSIS — Z953 Presence of xenogenic heart valve: Secondary | ICD-10-CM | POA: Diagnosis not present

## 2015-01-12 DIAGNOSIS — Z48812 Encounter for surgical aftercare following surgery on the circulatory system: Secondary | ICD-10-CM | POA: Diagnosis not present

## 2015-01-14 NOTE — Telephone Encounter (Signed)
OK to resume rehab without restriction

## 2015-01-15 ENCOUNTER — Encounter (HOSPITAL_COMMUNITY): Payer: Medicare Other

## 2015-01-15 ENCOUNTER — Encounter (HOSPITAL_COMMUNITY)
Admission: RE | Admit: 2015-01-15 | Discharge: 2015-01-15 | Disposition: A | Discharge status: Home / Self Care | Payer: Medicare Other | Source: Ambulatory Visit | Attending: Cardiovascular Disease | Admitting: Cardiovascular Disease

## 2015-01-15 DIAGNOSIS — Z953 Presence of xenogenic heart valve: Secondary | ICD-10-CM | POA: Diagnosis not present

## 2015-01-15 DIAGNOSIS — Z48812 Encounter for surgical aftercare following surgery on the circulatory system: Secondary | ICD-10-CM | POA: Diagnosis not present

## 2015-01-17 ENCOUNTER — Encounter: Payer: Self-pay | Admitting: Internal Medicine

## 2015-01-17 ENCOUNTER — Encounter (HOSPITAL_COMMUNITY)
Admission: RE | Admit: 2015-01-17 | Discharge: 2015-01-17 | Disposition: A | Discharge status: Home / Self Care | Payer: Medicare Other | Source: Ambulatory Visit | Attending: Cardiovascular Disease | Admitting: Cardiovascular Disease

## 2015-01-17 ENCOUNTER — Encounter (HOSPITAL_COMMUNITY): Payer: Medicare Other

## 2015-01-17 ENCOUNTER — Ambulatory Visit: Payer: Medicare Other | Admitting: Nurse Practitioner

## 2015-01-17 ENCOUNTER — Ambulatory Visit (INDEPENDENT_AMBULATORY_CARE_PROVIDER_SITE_OTHER): Payer: Medicare Other | Admitting: Internal Medicine

## 2015-01-17 VITALS — BP 120/62 | HR 47 | Ht 73.0 in | Wt 174.8 lb

## 2015-01-17 DIAGNOSIS — I483 Typical atrial flutter: Secondary | ICD-10-CM | POA: Diagnosis not present

## 2015-01-17 DIAGNOSIS — Z48812 Encounter for surgical aftercare following surgery on the circulatory system: Secondary | ICD-10-CM | POA: Insufficient documentation

## 2015-01-17 DIAGNOSIS — I25119 Atherosclerotic heart disease of native coronary artery with unspecified angina pectoris: Secondary | ICD-10-CM

## 2015-01-17 DIAGNOSIS — Z953 Presence of xenogenic heart valve: Secondary | ICD-10-CM | POA: Insufficient documentation

## 2015-01-17 NOTE — Patient Instructions (Signed)
Medication Instructions:  Your physician has recommended you make the following change in your medication:  1)Stop Coumadin    Labwork: None ordered   Testing/Procedures: None ordered   Follow-Up:  Your physician recommends that you schedule a follow-up appointment as needed    Any Other Special Instructions Will Be Listed Below (If Applicable).     If you need a refill on your cardiac medications before your next appointment, please call your pharmacy.

## 2015-01-17 NOTE — Progress Notes (Signed)
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 male who presents today for routine electrophysiology followup.  Since his atrial flutter ablation, the patient reports doing very well.   Denies procedure related complications.  He feels "52 again" now that he is in sinus.  Today, he denies symptoms of palpitations, chest pain, shortness of breath,  lower extremity edema, dizziness, presyncope, or syncope.  The patient is otherwise without complaint today.   Past Medical History  Diagnosis Date  . Aortic stenosis, severe   . Chronic diastolic congestive heart failure ([place])   . S/P aortic valve replacement with bioprosthetic valve 06/30/2014    25 mm [facility] Ease bovine pericardial tissue valve  . Atrial flutter, post-operative 07/27/2014    typical   . Sinus bradycardia    Past Surgical History  Procedure Laterality Date  . Appendectomy    . Hand surgery    . Cataract extraction Bilateral ? 2013    & 2014  . Left and right heart catheterization with coronary angiogram N/A 06/22/2014    Procedure: LEFT AND RIGHT HEART CATHETERIZATION WITH CORONARY ANGIOGRAM;  Surgeon: Troy Sine, MD;  Location: [facility] CATH LAB;  Service: Cardiovascular;  Laterality: N/A;  . Aortic valve replacement N/A 06/30/2014    Procedure: AORTIC VALVE REPLACEMENT (AVR);  Surgeon: Rexene Alberts, MD;  Location: [place];  Service: Open Heart Surgery;  Laterality: N/A;  . Intraoperative transesophageal echocardiogram N/A 06/30/2014    Procedure: INTRAOPERATIVE TRANSESOPHAGEAL ECHOCARDIOGRAM;  Surgeon: Rexene Alberts, MD;  Location: [place];  Service: Open Heart Surgery;  Laterality: N/A;  . Cardioversion N/A 10/09/2014    Procedure: CARDIOVERSION;  Surgeon: Troy Sine, MD;  Location: [facility] ENDOSCOPY;  Service: Cardiovascular;  Laterality: N/A;  . Electrophysiologic study N/A 12/19/2014    CTI ablation by Dr Rayann Heman    ROS- all systems are reviewed and negatives except as per HPI  above  Current Outpatient Prescriptions  Medication Sig Dispense Refill  . aspirin EC 81 MG tablet Take 81 mg by mouth daily.     No current facility-administered medications for this visit.    Physical Exam: Filed Vitals:   01/17/15 1224  BP: 120/62  Pulse: 47  Height: 6\' 1"  (1.854 m)  Weight: 174 lb 12.8 oz (79.289 kg)    GEN- The patient is well appearing, alert and oriented x 3 today.   Head- normocephalic, atraumatic Eyes-  Sclera clear, conjunctiva pink Ears- hearing intact Oropharynx- clear Lungs- Clear to ausculation bilaterally, normal work of breathing Heart- Regular rate and rhythm, no murmurs, rubs or gallops, PMI not laterally displaced GI- soft, NT, ND, + BS Extremities- no clubbing, cyanosis, or edema  ekg today reveals sinus bradycardia 47 bpm, with pacs, first degree AV block 224 msec, anterolateral infarction  Assessment and Plan:  1. Atrial flutter Resolved s/p ablation I have discussed with Dr Claiborne Billings.  We agree to stop coumadin and monitor closely for afib.  If he develops afib, will require restarting anticoagulation.   2. Sinus brady/ first degree AV block No current indication for pacing though I would not be surprised if he develops more advanced conduction system disease in the future.  Return to see me as needed  Thompson Grayer MD, [facility] 01/17/2015 12:43 PM

## 2015-01-19 ENCOUNTER — Encounter (HOSPITAL_COMMUNITY): Payer: Medicare Other

## 2015-01-19 ENCOUNTER — Encounter (HOSPITAL_COMMUNITY)
Admission: RE | Admit: 2015-01-19 | Discharge: 2015-01-19 | Disposition: A | Discharge status: Home / Self Care | Payer: Medicare Other | Source: Ambulatory Visit | Attending: Cardiovascular Disease | Admitting: Cardiovascular Disease

## 2015-01-19 DIAGNOSIS — Z953 Presence of xenogenic heart valve: Secondary | ICD-10-CM | POA: Diagnosis not present

## 2015-01-19 DIAGNOSIS — Z48812 Encounter for surgical aftercare following surgery on the circulatory system: Secondary | ICD-10-CM | POA: Diagnosis not present

## 2015-01-22 ENCOUNTER — Encounter (HOSPITAL_COMMUNITY)
Admission: RE | Admit: 2015-01-22 | Discharge: 2015-01-22 | Disposition: A | Discharge status: Home / Self Care | Payer: Medicare Other | Source: Ambulatory Visit | Attending: Cardiovascular Disease | Admitting: Cardiovascular Disease

## 2015-01-22 DIAGNOSIS — Z953 Presence of xenogenic heart valve: Secondary | ICD-10-CM | POA: Diagnosis not present

## 2015-01-22 DIAGNOSIS — Z48812 Encounter for surgical aftercare following surgery on the circulatory system: Secondary | ICD-10-CM | POA: Diagnosis not present

## 2015-01-24 ENCOUNTER — Encounter (HOSPITAL_COMMUNITY)
Admission: RE | Admit: 2015-01-24 | Discharge: 2015-01-24 | Disposition: A | Discharge status: Home / Self Care | Payer: Medicare Other | Source: Ambulatory Visit | Attending: Cardiovascular Disease | Admitting: Cardiovascular Disease

## 2015-01-24 ENCOUNTER — Encounter: Payer: Medicare Other | Admitting: Pharmacist Clinician (PhC)/ Clinical Pharmacy Specialist

## 2015-01-24 DIAGNOSIS — Z953 Presence of xenogenic heart valve: Secondary | ICD-10-CM | POA: Diagnosis not present

## 2015-01-24 DIAGNOSIS — Z48812 Encounter for surgical aftercare following surgery on the circulatory system: Secondary | ICD-10-CM | POA: Diagnosis not present

## 2015-01-24 NOTE — Progress Notes (Signed)
  This encounter was created in error - please disregard. - pt stopped warfarin  This encounter was created in error - please disregard.

## 2015-01-26 ENCOUNTER — Encounter (HOSPITAL_COMMUNITY)
Admission: RE | Admit: 2015-01-26 | Discharge: 2015-01-26 | Disposition: A | Discharge status: Home / Self Care | Payer: Medicare Other | Source: Ambulatory Visit | Attending: Cardiovascular Disease | Admitting: Cardiovascular Disease

## 2015-01-26 DIAGNOSIS — Z48812 Encounter for surgical aftercare following surgery on the circulatory system: Secondary | ICD-10-CM | POA: Diagnosis not present

## 2015-01-26 DIAGNOSIS — Z953 Presence of xenogenic heart valve: Secondary | ICD-10-CM | POA: Diagnosis not present

## 2015-01-29 ENCOUNTER — Encounter (HOSPITAL_COMMUNITY)
Admission: RE | Admit: 2015-01-29 | Discharge: 2015-01-29 | Disposition: A | Discharge status: Home / Self Care | Payer: Medicare Other | Source: Ambulatory Visit | Attending: Cardiovascular Disease | Admitting: Cardiovascular Disease

## 2015-01-29 DIAGNOSIS — Z48812 Encounter for surgical aftercare following surgery on the circulatory system: Secondary | ICD-10-CM | POA: Diagnosis not present

## 2015-01-29 DIAGNOSIS — Z953 Presence of xenogenic heart valve: Secondary | ICD-10-CM | POA: Diagnosis not present

## 2015-01-31 ENCOUNTER — Encounter (HOSPITAL_COMMUNITY)
Admission: RE | Admit: 2015-01-31 | Discharge: 2015-01-31 | Disposition: A | Discharge status: Home / Self Care | Payer: Medicare Other | Source: Ambulatory Visit | Attending: Cardiovascular Disease | Admitting: Cardiovascular Disease

## 2015-01-31 DIAGNOSIS — Z48812 Encounter for surgical aftercare following surgery on the circulatory system: Secondary | ICD-10-CM | POA: Diagnosis not present

## 2015-01-31 DIAGNOSIS — Z953 Presence of xenogenic heart valve: Secondary | ICD-10-CM | POA: Diagnosis not present

## 2015-02-02 ENCOUNTER — Encounter (HOSPITAL_COMMUNITY)
Admission: RE | Admit: 2015-02-02 | Discharge: 2015-02-02 | Disposition: A | Discharge status: Home / Self Care | Payer: Medicare Other | Source: Ambulatory Visit | Attending: Cardiovascular Disease | Admitting: Cardiovascular Disease

## 2015-02-02 DIAGNOSIS — Z48812 Encounter for surgical aftercare following surgery on the circulatory system: Secondary | ICD-10-CM | POA: Diagnosis not present

## 2015-02-02 DIAGNOSIS — Z953 Presence of xenogenic heart valve: Secondary | ICD-10-CM | POA: Diagnosis not present

## 2015-02-05 ENCOUNTER — Encounter (HOSPITAL_COMMUNITY)
Admission: RE | Admit: 2015-02-05 | Discharge: 2015-02-05 | Disposition: A | Discharge status: Home / Self Care | Payer: Medicare Other | Source: Ambulatory Visit | Attending: Cardiovascular Disease | Admitting: Cardiovascular Disease

## 2015-02-05 DIAGNOSIS — Z953 Presence of xenogenic heart valve: Secondary | ICD-10-CM | POA: Diagnosis not present

## 2015-02-05 DIAGNOSIS — Z48812 Encounter for surgical aftercare following surgery on the circulatory system: Secondary | ICD-10-CM | POA: Diagnosis not present

## 2015-02-07 ENCOUNTER — Encounter (HOSPITAL_COMMUNITY)
Admission: RE | Admit: 2015-02-07 | Discharge: 2015-02-07 | Disposition: A | Discharge status: Home / Self Care | Payer: Medicare Other | Source: Ambulatory Visit | Attending: Cardiovascular Disease | Admitting: Cardiovascular Disease

## 2015-02-07 DIAGNOSIS — Z953 Presence of xenogenic heart valve: Secondary | ICD-10-CM | POA: Diagnosis not present

## 2015-02-07 DIAGNOSIS — Z48812 Encounter for surgical aftercare following surgery on the circulatory system: Secondary | ICD-10-CM | POA: Diagnosis not present

## 2015-02-12 ENCOUNTER — Encounter (HOSPITAL_COMMUNITY)
Admission: RE | Admit: 2015-02-12 | Discharge: 2015-02-12 | Disposition: A | Discharge status: Home / Self Care | Payer: Medicare Other | Source: Ambulatory Visit | Attending: Cardiovascular Disease | Admitting: Cardiovascular Disease

## 2015-02-12 DIAGNOSIS — Z48812 Encounter for surgical aftercare following surgery on the circulatory system: Secondary | ICD-10-CM | POA: Diagnosis not present

## 2015-02-12 DIAGNOSIS — Z953 Presence of xenogenic heart valve: Secondary | ICD-10-CM | POA: Diagnosis not present

## 2015-02-14 ENCOUNTER — Encounter (HOSPITAL_COMMUNITY)
Admission: RE | Admit: 2015-02-14 | Discharge: 2015-02-14 | Disposition: A | Discharge status: Home / Self Care | Payer: Medicare Other | Source: Ambulatory Visit | Attending: Cardiovascular Disease | Admitting: Cardiovascular Disease

## 2015-02-14 DIAGNOSIS — Z953 Presence of xenogenic heart valve: Secondary | ICD-10-CM | POA: Diagnosis not present

## 2015-02-14 DIAGNOSIS — Z48812 Encounter for surgical aftercare following surgery on the circulatory system: Secondary | ICD-10-CM | POA: Diagnosis not present

## 2015-02-16 ENCOUNTER — Encounter (HOSPITAL_COMMUNITY)
Admission: RE | Admit: 2015-02-16 | Discharge: 2015-02-16 | Disposition: A | Discharge status: Home / Self Care | Payer: Medicare Other | Source: Ambulatory Visit | Attending: Cardiovascular Disease | Admitting: Cardiovascular Disease

## 2015-02-16 DIAGNOSIS — Z48812 Encounter for surgical aftercare following surgery on the circulatory system: Secondary | ICD-10-CM | POA: Insufficient documentation

## 2015-02-16 DIAGNOSIS — Z953 Presence of xenogenic heart valve: Secondary | ICD-10-CM | POA: Diagnosis not present

## 2015-02-16 NOTE — Progress Notes (Signed)
Pt graduated from cardiac rehab program today with completion of 36 exercise sessions in Phase II. Pt maintained good attendance and progressed nicely during his participation in rehab as evidenced by increased MET level.   Medication list reconciled. Repeat  PHQ score-  0.  Pt has made significant lifestyle changes and should be commended for his success. Pt feels he has achieved his goals during cardiac rehab.   Pt plans to continue exercise by exercising on his own at home.

## 2015-02-19 ENCOUNTER — Encounter (HOSPITAL_COMMUNITY): Payer: Medicare Other

## 2015-02-21 ENCOUNTER — Encounter (HOSPITAL_COMMUNITY): Payer: Medicare Other

## 2015-02-23 ENCOUNTER — Encounter (HOSPITAL_COMMUNITY): Payer: Medicare Other

## 2015-02-26 ENCOUNTER — Encounter (HOSPITAL_COMMUNITY): Payer: Medicare Other

## 2015-02-26 ENCOUNTER — Ambulatory Visit: Payer: Medicare Other | Admitting: Internal Medicine

## 2015-02-28 ENCOUNTER — Encounter (HOSPITAL_COMMUNITY): Payer: Medicare Other

## 2015-03-02 ENCOUNTER — Encounter (HOSPITAL_COMMUNITY): Payer: Medicare Other

## 2015-04-09 ENCOUNTER — Ambulatory Visit (INDEPENDENT_AMBULATORY_CARE_PROVIDER_SITE_OTHER): Payer: Medicare Other | Admitting: Cardiovascular Disease

## 2015-04-09 ENCOUNTER — Encounter: Payer: Self-pay | Admitting: Cardiovascular Disease

## 2015-04-09 VITALS — BP 94/64 | HR 50 | Ht 73.0 in | Wt 176.1 lb

## 2015-04-09 DIAGNOSIS — I5032 Chronic diastolic (congestive) heart failure: Secondary | ICD-10-CM

## 2015-04-09 DIAGNOSIS — I4892 Unspecified atrial flutter: Secondary | ICD-10-CM

## 2015-04-09 DIAGNOSIS — I350 Nonrheumatic aortic (valve) stenosis: Secondary | ICD-10-CM

## 2015-04-09 DIAGNOSIS — Z952 Presence of prosthetic heart valve: Secondary | ICD-10-CM

## 2015-04-09 DIAGNOSIS — Z954 Presence of other heart-valve replacement: Secondary | ICD-10-CM | POA: Diagnosis not present

## 2015-04-09 DIAGNOSIS — Z79899 Other long term (current) drug therapy: Secondary | ICD-10-CM

## 2015-04-09 DIAGNOSIS — Z953 Presence of xenogenic heart valve: Secondary | ICD-10-CM

## 2015-04-09 DIAGNOSIS — Z13220 Encounter for screening for lipoid disorders: Secondary | ICD-10-CM

## 2015-04-09 NOTE — Patient Instructions (Signed)
Medication Instructions:   Same no changes  Labwork:  Fasting labs ordered Your physician recommends that you return for lab work   Testing/Procedure Echo.  Your physician has requested that you have an echocardiogram. Echocardiography is a painless test that uses sound waves to create images of your heart. It provides your doctor with information about the size and shape of your heart and how well your heart's chambers and valves are working. This procedure takes approximately one hour. There are no restrictions for this procedure.   Follow-Up:  6 months  Any Other Special Instructions Will Be Listed Below (If Applicable).

## 2015-04-09 NOTE — Progress Notes (Signed)
Patient ID: Erik [name], male   DOB: [birth year], 80 y.o.   MRN: [id number]     HPI: Erik [name] is a 80 y.o. male who presents to the office today for a 4 monthfollow up cardiology evaluation.  I initially saw Erik [name] in cardiology consultation in April 2016 while in the hospital after he experienced a brief episode of chest discomfort associated with presyncope and transient visual blurring.  Physical examination suggested a murmur of severe aortic stenosis.  This was verified by echo Doppler data, which suggested critical AS.  I performed a right and left heart catheterization 06/22/2014.  He was found to have critical aortic valve stenosis without significant coronary obstructive disease with only a smooth 20% luminal narrowing of the proximal RCA and moderate global LV dysfunction with an EF of 35-40%.  He had upper normal right heart pressures.  He underwent aortic valve replacement surgery by Dr. Roxy Manns using a bioprosthetic tissue valve on 06/30/2014.  During his hospitalization, he was taken off all beta blockers because of bradycardia.  Ultimately, beta blocker was reinstituted.  He underwent cardioversion on 10/09/2014 after being on Coumadin anticoagulation for over a month.  He was seen on 10/25/2014 by [name], which time he was found to be back in atrial flutter.  He subsequent was referred to Dr. Rayann Heman who saw him on 11/29/2014.  He underwent successful atrial flutter ablation by Dr. Rayann Heman on 12/19/2014.  Subsequently, he was taken off essentially all medications with the exception of aspirin.  He is not aware of any recurrent atrial flutter or atrial fibrillation.  He continues to be active and exercises regularly.  He denies presyncope or syncope.  He denies chest pressure.  He will be seeing Dr. Roxy Manns for one-year evaluation following his valve surgery in April.  He presents for follow-up evaluation  Past Medical History  Diagnosis Date  . Aortic stenosis, severe   . Chronic  diastolic congestive heart failure ([place])   . S/P aortic valve replacement with bioprosthetic valve 06/30/2014    25 mm [facility] Ease bovine pericardial tissue valve  . Atrial flutter, post-operative 07/27/2014    typical   . Sinus bradycardia     Past Surgical History  Procedure Laterality Date  . Appendectomy    . Hand surgery    . Cataract extraction Bilateral ? 2013    & 2014  . Left and right heart catheterization with coronary angiogram N/A 06/22/2014    Procedure: LEFT AND RIGHT HEART CATHETERIZATION WITH CORONARY ANGIOGRAM;  Surgeon: Troy Sine, MD;  Location: [facility] CATH LAB;  Service: Cardiovascular;  Laterality: N/A;  . Aortic valve replacement N/A 06/30/2014    Procedure: AORTIC VALVE REPLACEMENT (AVR);  Surgeon: Rexene Alberts, MD;  Location: [place];  Service: Open Heart Surgery;  Laterality: N/A;  . Intraoperative transesophageal echocardiogram N/A 06/30/2014    Procedure: INTRAOPERATIVE TRANSESOPHAGEAL ECHOCARDIOGRAM;  Surgeon: Rexene Alberts, MD;  Location: [place];  Service: Open Heart Surgery;  Laterality: N/A;  . Cardioversion N/A 10/09/2014    Procedure: CARDIOVERSION;  Surgeon: Troy Sine, MD;  Location: [facility] ENDOSCOPY;  Service: Cardiovascular;  Laterality: N/A;  . Electrophysiologic study N/A 12/19/2014    CTI ablation by Dr Rayann Heman    No Known Allergies  Current Outpatient Prescriptions  Medication Sig Dispense Refill  . aspirin EC 81 MG tablet Take 81 mg by mouth daily.     No current facility-administered medications for this visit.    Social History  Social History  . Marital Status: Married    Spouse Name: N/A  . Number of Children: N/A  . Years of Education: N/A   Occupational History  . Not on file.   Social History Main Topics  . Smoking status: Never Smoker   . Smokeless tobacco: Never Used     Comment: smoked in college  . Alcohol Use: No     Comment: occ   . Drug Use: No  . Sexual Activity: Yes   Other Topics Concern  . Not on file    Social History Narrative   Pt lives in [place] with spouse.  Retired Scientist, physiological.    Family History  Problem Relation Age of Onset  . Stroke Mother   . Hypertension Mother   . Cancer Father     ROS General: Negative; No fevers, chills, or night sweats HEENT: Negative; No changes in vision or hearing, sinus congestion, difficulty swallowing Pulmonary: Negative; No cough, wheezing, shortness of breath, hemoptysis Cardiovascular: See HPI:  GI: Negative; No nausea, vomiting, diarrhea, or abdominal pain GU: Negative; No dysuria, hematuria, or difficulty voiding Musculoskeletal: Negative; no myalgias, joint pain, or weakness Hematologic: Negative; no easy bruising, bleeding Endocrine: Negative; no heat/cold intolerance; no diabetes, Neuro: Negative; no changes in balance, headaches Skin: Negative; No rashes or skin lesions Psychiatric: Negative; No behavioral problems, depression Sleep: Negative; No snoring,  daytime sleepiness, hypersomnolence, bruxism, restless legs, hypnogognic hallucinations. Other comprehensive 14 point system review is negative   Physical Exam BP 94/64 mmHg  Pulse 50  Ht '6\' 1"'$  (1.854 m)  Wt 176 lb 1 oz (79.861 kg)  BMI 23.23 kg/m2 Wt Readings from Last 3 Encounters:  04/09/15 176 lb 1 oz (79.861 kg)  01/17/15 174 lb 12.8 oz (79.289 kg)  12/19/14 170 lb (77.111 kg)   General: Alert, oriented, no distress.  Skin: normal turgor, no rashes, warm and dry HEENT: Normocephalic, atraumatic. Pupils equal round and reactive to light; sclera anicteric; extraocular muscles intact, No lid lag; Nose without nasal septal hypertrophy; Mouth/Parynx benign; Mallinpatti scale 2 Neck: No JVD, no carotid bruits; normal carotid upstroke Lungs: clear to ausculatation and percussion bilaterally; no wheezing or rales, normal inspiratory and expiratory effort Chest wall: without tenderness to palpitation Heart: PMI not displaced, RRR, s1 s2 normal, 1/6 systolic  murmur in the aortic area concordant with his bioprosthesis, No diastolic murmur, no rubs, gallops, thrills, or heaves Abdomen: soft, nontender; no hepatosplenomehaly, BS+; abdominal aorta nontender and not dilated by palpation. Back: no CVA tenderness Pulses: 2+  Musculoskeletal: full range of motion, normal strength, no joint deformities Extremities: Pulses 2+, no clubbing cyanosis or edema, Homan's sign negative  Neurologic: grossly nonfocal; Cranial nerves grossly wnl Psychologic: Normal mood and affect  ECG (independently read by me): Sinus bradycardia 50 bpm with first-degree AV block with a PR interval at 234 ms and evidence for occasional PAC.  September 2016 ECG (independently read by me): Atrial flutter with 4:1 block at 64 bpm.  09/11/2014 ECG (independently read by me): Atrial flutter with 41 block.  One isolated PVC.  Ventricular rate 67 bpm.  Nonspecific ST-T change  Prior ECG (independently read by me): Underlying atrial flutter with a ventricular rate at 88 with frequent PVCs in a bigeminal pattern transiently.  LABS:  BMP Latest Ref Rng 12/12/2014 10/06/2014 08/22/2014  Glucose 70 - 99 mg/dL 97 90 86  BUN 6 - 23 mg/dL '14 17 16  '$ Creatinine 0.40 - 1.50 mg/dL 1.12 1.15 1.10  Sodium 135 -  145 mEq/L 141 140 138  Potassium 3.5 - 5.1 mEq/L 4.9 5.0 5.0  Chloride 96 - 112 mEq/L 107 105 103  CO2 19 - 32 mEq/L '29 25 27  '$ Calcium 8.4 - 10.5 mg/dL 9.6 9.7 9.9    Hepatic Function Latest Ref Rng 08/22/2014 06/29/2014 06/23/2014  Total Protein 6.0 - 8.3 g/dL 7.7 7.1 6.3  Albumin 3.5 - 5.2 g/dL 4.2 3.9 3.2(L)  AST 0 - 37 U/L '14 16 15  '$ ALT 0 - 53 U/L '13 12 14  '$ Alk Phosphatase 39 - 117 U/L 79 58 57  Total Bilirubin 0.2 - 1.2 mg/dL 0.8 1.0 1.7(H)    CBC Latest Ref Rng 12/12/2014 10/06/2014 07/05/2014  WBC 4.0 - 10.5 K/uL 6.2 5.4 5.9  Hemoglobin 13.0 - 17.0 g/dL 12.4(L) 13.3 8.8(L)  Hematocrit 39.0 - 52.0 % 36.5(L) 37.5(L) 25.2(L)  Platelets 150.0 - 400.0 K/uL 265.0 278 206   Lab Results    Component Value Date   MCV 93.6 12/12/2014   MCV 95.2 10/06/2014   MCV 102.0* 07/05/2014    Lab Results  Component Value Date   TSH 2.025 10/06/2014    BNP    Component Value Date/Time   BNP 1282.9* 06/19/2014 1630    ProBNP No results found for: PROBNP   Lipid Panel     Component Value Date/Time   CHOL 135 06/19/2014 2134   TRIG 60 06/19/2014 2134   HDL 35* 06/19/2014 2134   CHOLHDL 3.9 06/19/2014 2134   VLDL 12 06/19/2014 2134   LDLCALC 88 06/19/2014 2134     RADIOLOGY: No results found.    ASSESSMENT AND PLAN: Mr. Erik [name] is a 80 year old gentleman who was found to have severe symptomatic critical aortic valve stenosis after he presented to the hospital following a presyncopal spell.  He successfully underwent aortic valve replacement surgery on 06/30/2014 with a 25 mm [facility] Ease bovine pericardial tissue valve.  He says we develop atrial flutter in October underwent successful ablation.  He was taken off warfarin therapy as well as beta blocker therapy.  He continues to be on aspirin alone.  His blood pressure is stable and on repeat by me today was 124/70.  Resting pulse was in the 50s.  He was asymptomatic without dizziness.  If he does develop recurrent AF, he will need reinstitution of anticoagulation therapy.  At present, he continues to do exceptionally well.  In early April 2017.  I'm scheduling him for one-year follow-up echo Doppler study following his aortic valve surgery.  Prior to his office visit with Dr. Ricard Dillon.  I will see him in 6 months for cardiology evaluation.  Fasting laboratory will be checked and he will be contacted regarding the results and adjustments made if necessary.  Time spent: 25 minutes  Troy Sine, MD, [facility]  04/09/2015 6:45 PM

## 2015-06-22 DIAGNOSIS — Z13220 Encounter for screening for lipoid disorders: Secondary | ICD-10-CM | POA: Diagnosis not present

## 2015-06-22 DIAGNOSIS — Z79899 Other long term (current) drug therapy: Secondary | ICD-10-CM | POA: Diagnosis not present

## 2015-06-22 DIAGNOSIS — I4892 Unspecified atrial flutter: Secondary | ICD-10-CM | POA: Diagnosis not present

## 2015-06-22 LAB — CBC
HEMATOCRIT: 39.7 % (ref 38.5–50.0)
Hemoglobin: 13.4 g/dL (ref 13.2–17.1)
MCH: 33.2 pg — AB (ref 27.0–33.0)
MCHC: 33.8 g/dL (ref 32.0–36.0)
MCV: 98.3 fL (ref 80.0–100.0)
MPV: 11.6 fL (ref 7.5–12.5)
PLATELETS: 217 10*3/uL (ref 140–400)
RBC: 4.04 MIL/uL — ABNORMAL LOW (ref 4.20–5.80)
RDW: 14.1 % (ref 11.0–15.0)
WBC: 4.6 10*3/uL (ref 3.8–10.8)

## 2015-06-23 LAB — COMPREHENSIVE METABOLIC PANEL
ALT: 9 U/L (ref 9–46)
AST: 14 U/L (ref 10–35)
Albumin: 4 g/dL (ref 3.6–5.1)
Alkaline Phosphatase: 67 U/L (ref 40–115)
BUN: 13 mg/dL (ref 7–25)
CALCIUM: 9.1 mg/dL (ref 8.6–10.3)
CHLORIDE: 104 mmol/L (ref 98–110)
CO2: 25 mmol/L (ref 20–31)
Creat: 1.01 mg/dL (ref 0.70–1.18)
Glucose, Bld: 85 mg/dL (ref 65–99)
POTASSIUM: 4.7 mmol/L (ref 3.5–5.3)
Sodium: 138 mmol/L (ref 135–146)
TOTAL PROTEIN: 7 g/dL (ref 6.1–8.1)
Total Bilirubin: 1 mg/dL (ref 0.2–1.2)

## 2015-06-23 LAB — LIPID PANEL
CHOL/HDL RATIO: 4.3 ratio (ref <=5.0)
CHOLESTEROL: 147 mg/dL (ref 125–200)
HDL: 34 mg/dL — AB (ref >=40)
LDL Cholesterol: 100 mg/dL (ref <130)
Triglycerides: 66 mg/dL (ref <150)
VLDL: 13 mg/dL (ref <30)

## 2015-06-23 LAB — TSH: TSH: 2.09 mIU/L (ref 0.40–4.50)

## 2015-06-25 ENCOUNTER — Ambulatory Visit (HOSPITAL_COMMUNITY): Payer: Medicare Other | Attending: Cardiovascular Disease

## 2015-06-25 ENCOUNTER — Other Ambulatory Visit: Payer: Self-pay

## 2015-06-25 DIAGNOSIS — I517 Cardiomegaly: Secondary | ICD-10-CM | POA: Insufficient documentation

## 2015-06-25 DIAGNOSIS — Z952 Presence of prosthetic heart valve: Secondary | ICD-10-CM

## 2015-06-25 DIAGNOSIS — Z954 Presence of other heart-valve replacement: Secondary | ICD-10-CM

## 2015-06-25 DIAGNOSIS — I509 Heart failure, unspecified: Secondary | ICD-10-CM | POA: Diagnosis not present

## 2015-06-25 DIAGNOSIS — Z953 Presence of xenogenic heart valve: Secondary | ICD-10-CM | POA: Insufficient documentation

## 2015-06-25 DIAGNOSIS — I959 Hypotension, unspecified: Secondary | ICD-10-CM | POA: Diagnosis not present

## 2015-06-25 DIAGNOSIS — I359 Nonrheumatic aortic valve disorder, unspecified: Secondary | ICD-10-CM | POA: Diagnosis present

## 2015-07-02 ENCOUNTER — Ambulatory Visit: Payer: Medicare Other | Admitting: Thoracic Surgery (Cardiothoracic Vascular Surgery)

## 2015-07-09 ENCOUNTER — Ambulatory Visit (INDEPENDENT_AMBULATORY_CARE_PROVIDER_SITE_OTHER): Payer: Medicare Other | Admitting: Thoracic Surgery (Cardiothoracic Vascular Surgery)

## 2015-07-09 ENCOUNTER — Other Ambulatory Visit (HOSPITAL_COMMUNITY): Payer: Medicare Other

## 2015-07-09 ENCOUNTER — Encounter: Payer: Self-pay | Admitting: Thoracic Surgery (Cardiothoracic Vascular Surgery)

## 2015-07-09 VITALS — BP 120/60 | HR 42 | Resp 16 | Ht 73.0 in | Wt 173.6 lb

## 2015-07-09 DIAGNOSIS — I350 Nonrheumatic aortic (valve) stenosis: Secondary | ICD-10-CM | POA: Diagnosis not present

## 2015-07-09 DIAGNOSIS — Z954 Presence of other heart-valve replacement: Secondary | ICD-10-CM | POA: Diagnosis not present

## 2015-07-09 DIAGNOSIS — Z953 Presence of xenogenic heart valve: Secondary | ICD-10-CM

## 2015-07-09 NOTE — Progress Notes (Signed)
[address]       [place] [address]             [phone]     CARDIOTHORACIC SURGERY OFFICE NOTE  Referring Provider is Troy Sine, MD PCP is Jani Gravel, MD   HPI:  Patient is a 80 year old male who returns to the office for routine follow-up approximately one year status post aortic valve replacement using a bioprosthetic tissue valve on 06/30/2014 for severe symptomatic aortic stenosis.  He was last seen here in our office on 10/04/2014.  His postoperative recovery was notable for the development of persistent atrial flutter for which he eventually underwent went catheter-based ablation last October.  Since then he has done remarkably well. He was seen in follow-up recently by Dr. Georgina Peer and he returns to our office for routine follow-up today. The patient is quite active physically and he reports no physical limitations whatsoever. He never gets any exertional shortness of breath or chest discomfort. He has not had any palpitations or dizzy spells. Overall he feels quite well and he notes that he feels dramatically improved in comparison with how he felt prior to surgery.   Current Outpatient Prescriptions  Medication Sig Dispense Refill  . aspirin EC 81 MG tablet Take 81 mg by mouth daily.     No current facility-administered medications for this visit.      Physical Exam:   BP 120/60 mmHg  Pulse 42  Resp 16  Ht 6\' 1"  (1.854 m)  Wt 173 lb 9.6 oz (78.744 kg)  BMI 22.91 kg/m2  SpO2 99%  General:  Well-appearing  Chest:   Clear to auscultation  CV:   Regular rate and rhythm without murmur  Incisions:  Completely healed, sternum is stable  Abdomen:  Soft nontender  Extremities:  Warm and well-perfused  Diagnostic Tests:  Transthoracic Echocardiography  Patient: Erik [name], Erik [name] MR #: [id number] Study Date: 06/25/2015 Gender: M Age: 31 Height: 185.4 cm Weight: 79.9 kg BSA: 2.03 m^2 Pt.  Status: Room:  ATTENDING Shelva Majestic, M.D. ORDERING Shelva Majestic, M.D. REFERRING Shelva Majestic, M.D. SONOGRAPHER Wyatt Mage, RDCS PERFORMING [facility], Outpatient  cc:  ------------------------------------------------------------------- LV EF: 60% - 65%  ------------------------------------------------------------------- Indications: S/p AVR (Z95.4).  ------------------------------------------------------------------- History: PMH: Syncope. Atrial flutter. Congestive heart failure. Aortic valve disease. Risk factors: Hypotension.  ------------------------------------------------------------------- Study Conclusions  - Left ventricle: The cavity size was normal. Wall thickness was  increased in a pattern of mild LVH. Systolic function was normal.  The estimated ejection fraction was in the range of 60% to 65%.  Wall motion was normal; there were no regional wall motion  abnormalities. - Aortic valve: A bioprosthesis was present. - Right atrium: The atrium was mildly to moderately dilated.  ------------------------------------------------------------------- Labs, prior tests, procedures, and surgery: Transthoracic echocardiography (06/21/2014). EF was 55%.  Valve surgery. Aortic valve replacement with a bioprosthetic valve. Transthoracic echocardiography. M-mode, complete 2D, spectral Doppler, and color Doppler. Birthdate: Patient birthdate: 01-04-1936. Age: Patient is 80 yr old. Sex: Gender: male. BMI: 23.2 kg/m^2. Blood pressure: 94/64 Patient status: Outpatient. Study date: Study date: 06/25/2015. Study time: 10:22 AM. Location: [facility]  -------------------------------------------------------------------  ------------------------------------------------------------------- Left ventricle: The cavity size was normal. Wall thickness was increased in a pattern of mild LVH. Systolic function  was normal. The estimated ejection fraction was in the range of 60% to 65%. Wall motion was normal; there were no regional wall motion abnormalities.  ------------------------------------------------------------------- Aortic valve: A bioprosthesis was present. Doppler:  There was no stenosis. There was no significant regurgitation. VTI ratio of LVOT to aortic valve: 0.73. Peak velocity ratio of LVOT to aortic valve: 0.76. Mean velocity ratio of LVOT to aortic valve: 0.72. Mean gradient (S): 8 mm Hg. Peak gradient (S): 15 mm Hg.  ------------------------------------------------------------------- Aorta: Aortic root: The aortic root was normal in size. Ascending aorta: The ascending aorta was normal in size.  ------------------------------------------------------------------- Mitral valve: Structurally normal valve. Leaflet separation was normal. Doppler: Transvalvular velocity was within the normal range. There was no evidence for stenosis. There was trivial regurgitation.  ------------------------------------------------------------------- Left atrium: The atrium was normal in size.  ------------------------------------------------------------------- Right ventricle: The cavity size was normal. Systolic function was normal.  ------------------------------------------------------------------- Pulmonic valve: The valve appears to be grossly normal. Doppler: There was no significant regurgitation.  ------------------------------------------------------------------- Tricuspid valve: Structurally normal valve. Leaflet separation was normal. Doppler: Transvalvular velocity was within the normal range. There was trivial regurgitation.  ------------------------------------------------------------------- Right atrium: The atrium was mildly to moderately  dilated.  ------------------------------------------------------------------- Pericardium: There was no pericardial effusion.  ------------------------------------------------------------------- Systemic veins: Inferior vena cava: The vessel was normal in size. The respirophasic diameter changes were in the normal range (>= 50%), consistent with normal central venous pressure.  ------------------------------------------------------------------- Measurements  Left ventricle Value Reference LV ID, ED, PLAX chordal 45.3 mm 43 - 52 LV ID, ES, PLAX chordal 29.7 mm 23 - 38 LV fx shortening, PLAX chordal 34 % >=29 LV PW thickness, ED 11 mm --------- IVS/LV PW ratio, ED 1.19 <=1.3  Ventricular septum Value Reference IVS thickness, ED 13.1 mm ---------  LVOT Value Reference LVOT peak velocity, S 148 cm/s --------- LVOT mean velocity, S 92.5 cm/s --------- LVOT VTI, S 34.8 cm --------- LVOT peak gradient, S 9 mm Hg ---------  Aortic valve Value Reference Aortic valve peak velocity, S 195 cm/s --------- Aortic valve mean velocity, S 129 cm/s --------- Aortic valve VTI, S 48 cm --------- Aortic mean gradient, S 8 mm Hg --------- Aortic peak gradient, S 15 mm Hg --------- VTI ratio, LVOT/AV 0.73 --------- Velocity ratio, peak, LVOT/AV 0.76 --------- Velocity ratio, mean, LVOT/AV 0.72 ---------  Aorta Value  Reference Aortic root ID, ED 35 mm ---------  Left atrium Value Reference LA ID, A-P, ES 48 mm --------- LA ID/bsa, A-P (H) 2.37 cm/m^2 <=2.2 LA volume, S 63.1 ml --------- LA volume/bsa, S 31.1 ml/m^2 --------- LA volume, ES, 1-p A4C 62.4 ml --------- LA volume/bsa, ES, 1-p A4C 30.8 ml/m^2 --------- LA volume, ES, 1-p A2C 58.5 ml --------- LA volume/bsa, ES, 1-p A2C 28.8 ml/m^2 ---------  Systemic veins Value Reference Estimated CVP 3 mm Hg ---------  Legend: (L) and (H) mark values outside specified reference range.  ------------------------------------------------------------------- Prepared and Electronically Authenticated by  Mertie Moores, M.D. 2017-04-10T14:34:28        Impression:  Patient is doing exceptionally well proximally one year status post aortic valve replacement using a bioprosthetic tissue valve. Late follow-up echocardiogram looks good with normal left ventricular systolic function and normal functioning bioprosthetic tissue valve in the aortic position. Mean transvalvular gradient across the valve was estimated less than 10 mmHg.   Plan:  In the future the patient will return to see [place] only as needed.  The patient has been reminded regarding the importance of dental hygiene and the lifelong need for antibiotic prophylaxis for all dental cleanings and other related invasive procedures.  I spent in excess of 15 minutes during the conduct of this office consultation and >50% of this time involved direct face-to-face  encounter with the patient for counseling and/or coordination of their care.   Valentina Gu. Roxy Manns, MD 07/09/2015 12:04 PM

## 2015-07-09 NOTE — Patient Instructions (Signed)
Continue all previous medications without any changes at this time  You may resume normal physical activity without any particular limitations at this time, and return to our office only as needed should any further problems or questions arise.  Endocarditis is a potentially serious infection of heart valves or inside lining of the heart.  It occurs more commonly in patients with diseased heart valves (such as patient's with aortic or mitral valve disease) and in patients who have undergone heart valve repair or replacement.  Certain surgical and dental procedures may put you at risk, such as dental cleaning, other dental procedures, or any surgery involving the respiratory, urinary, gastrointestinal tract, gallbladder or prostate gland.   To minimize your chances for develooping endocarditis, maintain good oral health and seek prompt medical attention for any infections involving the mouth, teeth, gums, skin or urinary tract.    Always notify your doctor or dentist about your underlying heart valve condition before having any invasive procedures. You will need to take antibiotics before certain procedures, including all routine dental cleanings or other dental procedures.  Your cardiologist or dentist should prescribe these antibiotics for you to be taken ahead of time.       

## 2015-11-21 ENCOUNTER — Encounter: Payer: Self-pay | Admitting: Cardiovascular Disease

## 2015-11-21 ENCOUNTER — Ambulatory Visit (INDEPENDENT_AMBULATORY_CARE_PROVIDER_SITE_OTHER): Payer: Medicare Other | Admitting: Cardiovascular Disease

## 2015-11-21 VITALS — BP 128/66 | HR 53 | Ht 73.0 in | Wt 176.8 lb

## 2015-11-21 DIAGNOSIS — I5032 Chronic diastolic (congestive) heart failure: Secondary | ICD-10-CM | POA: Diagnosis not present

## 2015-11-21 DIAGNOSIS — I959 Hypotension, unspecified: Secondary | ICD-10-CM | POA: Diagnosis not present

## 2015-11-21 DIAGNOSIS — R001 Bradycardia, unspecified: Secondary | ICD-10-CM

## 2015-11-21 DIAGNOSIS — Z954 Presence of other heart-valve replacement: Secondary | ICD-10-CM

## 2015-11-21 DIAGNOSIS — I499 Cardiac arrhythmia, unspecified: Secondary | ICD-10-CM

## 2015-11-21 DIAGNOSIS — Z953 Presence of xenogenic heart valve: Secondary | ICD-10-CM

## 2015-11-21 DIAGNOSIS — I350 Nonrheumatic aortic (valve) stenosis: Secondary | ICD-10-CM | POA: Diagnosis not present

## 2015-11-21 DIAGNOSIS — I498 Other specified cardiac arrhythmias: Secondary | ICD-10-CM

## 2015-11-21 NOTE — Patient Instructions (Signed)
Your physician recommends that you schedule a follow-up appointment and echo in April 2018.

## 2015-11-21 NOTE — Progress Notes (Signed)
Patient ID: Erik [name], male   DOB: [birth year], 80 y.o.   MRN: [id number]    PCP: Dr. Jani Gravel  HPI: Erik [name] is a 80 y.o. male who presents to the office today for an 8 monthfollow up cardiology evaluation.  I initially saw Erik [name] in cardiology consultation in April 2016 while in the hospital after he experienced a brief episode of chest discomfort associated with presyncope and transient visual blurring.  Physical examination suggested a murmur of severe aortic stenosis.  This was verified by echo Doppler data, which suggested critical AS.  I performed a right and left heart catheterization 06/22/2014.  He was found to have critical aortic valve stenosis without significant coronary obstructive disease with only a smooth 20% luminal narrowing of the proximal RCA and moderate global LV dysfunction with an EF of 35-40%.  He had upper normal right heart pressures.  He underwent aortic valve replacement surgery by Dr. Roxy Manns using a bioprosthetic tissue valve on 06/30/2014.  During his hospitalization, he was taken off all beta blockers because of bradycardia.  Ultimately, beta blocker was reinstituted.  He underwent cardioversion on 10/09/2014 after being on Coumadin anticoagulation for over a month.  He was seen on 10/25/2014 by [name], which time he was found to be back in atrial flutter.  He subsequent was referred to Dr. Rayann Heman who saw him on 11/29/2014.  He underwent successful atrial flutter ablation by Dr. Rayann Heman on 12/19/2014.  Subsequently, he was taken off essentially all medications with the exception of aspirin.  He is not aware of any recurrent atrial flutter or atrial fibrillation.    Since I last saw him, he underwent a follow-up echo Doppler study in 06/25/2015.  This showed an ejection fraction of 60-65% with normal wall motion and diastolic function.  His aortic valve bioprosthesis was well-seated.  There was no stenosis.  The mean gradient was a peak gradient 15 concordant with  the valve.  His right atrium was mild moderately dilated.  He has seen Dr. Roxy Manns for follow-up evaluation who felt he was stable from his surgical standpoint.  Erik [name] continues to feel well.  He has had issues in the past with bradycardia. He is not on any medication except ASA 81 mg and denies any recurrent awareness of arrhythmia.  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  He denies chest pain, PND, orthopnea.  He denies presyncope or syncope.  Past Medical History:  Diagnosis Date  . Aortic stenosis, severe   . Atrial flutter, post-operative 07/27/2014   typical   . Chronic diastolic congestive heart failure ([place])   . S/P aortic valve replacement with bioprosthetic valve 06/30/2014   25 mm [facility] Ease bovine pericardial tissue valve  . Sinus bradycardia     Past Surgical History:  Procedure Laterality Date  . AORTIC VALVE REPLACEMENT N/A 06/30/2014   Procedure: AORTIC VALVE REPLACEMENT (AVR);  Surgeon: Rexene Alberts, MD;  Location: [facility];  Service: Open Heart Surgery;  Laterality: N/A;  . APPENDECTOMY    . CARDIOVERSION N/A 10/09/2014   Procedure: CARDIOVERSION;  Surgeon: Troy Sine, MD;  Location: [facility];  Service: Cardiovascular;  Laterality: N/A;  . CATARACT EXTRACTION Bilateral ? 2013    & 2014  . ELECTROPHYSIOLOGIC STUDY N/A 12/19/2014   CTI ablation by Dr Rayann Heman  . HAND SURGERY    . INTRAOPERATIVE TRANSESOPHAGEAL ECHOCARDIOGRAM N/A 06/30/2014   Procedure: INTRAOPERATIVE TRANSESOPHAGEAL ECHOCARDIOGRAM;  Surgeon: Valentina Gu  Roxy Manns, MD;  Location: [facility];  Service: Open Heart Surgery;  Laterality: N/A;  . LEFT AND RIGHT HEART CATHETERIZATION WITH CORONARY ANGIOGRAM N/A 06/22/2014   Procedure: LEFT AND RIGHT HEART CATHETERIZATION WITH CORONARY ANGIOGRAM;  Surgeon: Troy Sine, MD;  Location: [facility] C[facility] LAB;  Service: Cardiovascular;  Laterality: N/A;    No Known Allergies  Current Outpatient Prescriptions  Medication Sig  Dispense Refill  . aspirin EC 81 MG tablet Take 81 mg by mouth daily.     No current facility-administered medications for this visit.     Social History   Social History  . Marital status: Married    Spouse name: N/A  . Number of children: N/A  . Years of education: N/A   Occupational History  . Not on file.   Social History Main Topics  . Smoking status: Never Smoker  . Smokeless tobacco: Never Used     Comment: smoked in college  . Alcohol use No     Comment: occ   . Drug use: No  . Sexual activity: Yes   Other Topics Concern  . Not on file   Social History Narrative   Pt lives in [place] with spouse.  Retired Scientist, physiological.    Family History  Problem Relation Age of Onset  . Stroke Mother   . Hypertension Mother   . Cancer Father     ROS General: Negative; No fevers, chills, or night sweats HEENT: Negative; No changes in vision or hearing, sinus congestion, difficulty swallowing Pulmonary: Negative; No cough, wheezing, shortness of breath, hemoptysis Cardiovascular: See HPI:  GI: Negative; No nausea, vomiting, diarrhea, or abdominal pain GU: Negative; No dysuria, hematuria, or difficulty voiding Musculoskeletal: Negative; no myalgias, joint pain, or weakness Hematologic: Negative; no easy bruising, bleeding Endocrine: Negative; no heat/cold intolerance; no diabetes, Neuro: Negative; no changes in balance, headaches Skin: Negative; No rashes or skin lesions Psychiatric: Negative; No behavioral problems, depression Sleep: Negative; No snoring,  daytime sleepiness, hypersomnolence, bruxism, restless legs, hypnogognic hallucinations. Other comprehensive 14 point system review is negative   Physical Exam BP 128/66   Pulse (!) 53   Ht 6' 1" (1.854 m)   Wt 176 lb 12.8 oz (80.2 kg)   BMI 23.33 kg/m    Repeat blood pressure by me was 120/78.. Wt Readings from Last 3 Encounters:  11/21/15 176 lb 12.8 oz (80.2 kg)  07/09/15 173 lb 9.6 oz (78.7  kg)  04/09/15 176 lb 1 oz (79.9 kg)   General: Alert, oriented, no distress.  Skin: normal turgor, no rashes, warm and dry HEENT: Normocephalic, atraumatic. Pupils equal round and reactive to light; sclera anicteric; extraocular muscles intact, No lid lag; Nose without nasal septal hypertrophy; Mouth/Parynx benign; Mallinpatti scale 2 Neck: No JVD, no carotid bruits; normal carotid upstroke Lungs: clear to ausculatation and percussion bilaterally; no wheezing or rales, normal inspiratory and expiratory effort Chest wall: without tenderness to palpitation Heart: PMI not displaced, RRR, s1 s2 normal, 1/6 systolic murmur in the aortic area concordant with his bioprosthesis, No diastolic murmur, no rubs, gallops, thrills, or heaves Abdomen: soft, nontender; no hepatosplenomehaly, BS+; abdominal aorta nontender and not dilated by palpation. Back: no CVA tenderness Pulses: 2+  Musculoskeletal: full range of motion, normal strength, no joint deformities Extremities: Pulses 2+, no clubbing cyanosis or edema, Homan's sign negative  Neurologic: grossly nonfocal; Cranial nerves grossly wnl Psychologic: Normal mood and affect  ECG (independently read by me): Sinus bradycardia 53 bpm.  First-degree AV block.  There are PACs and atrial bigeminal pattern.  He has poor anterior R-wave voltage.  ECG (independently read by me): Sinus bradycardia 50 bpm with first-degree AV block with a PR interval at 234 ms and evidence for occasional PAC.  September 2016 ECG (independently read by me): Atrial flutter with 4:1 block at 64 bpm.  09/11/2014 ECG (independently read by me): Atrial flutter with 41 block.  One isolated PVC.  Ventricular rate 67 bpm.  Nonspecific ST-T change  Prior ECG (independently read by me): Underlying atrial flutter with a ventricular rate at 88 with frequent PVCs in a bigeminal pattern transiently.  LABS:  BMP Latest Ref Rng & Units 06/22/2015 12/12/2014 10/06/2014  Glucose 65 - 99 mg/dL 85  97 90  BUN 7 - 25 mg/dL _0 Creatinine 0.70 - 1.18 mg/dL 1.01 1.12 1.15  Sodium 135 - 146 mmol/L 138 141 140  Potassium 3.5 - 5.3 mmol/L 4.7 4.9 5.0  Chloride 98 - 110 mmol/L 104 107 105  CO2 20 - 31 mmol/L _1 Calcium 8.6 - 10.3 mg/dL 9.1 9.6 9.7    Hepatic Function Latest Ref Rng & Units 06/22/2015 08/22/2014 06/29/2014  Total Protein 6.1 - 8.1 g/dL 7.0 7.7 7.1  Albumin 3.6 - 5.1 g/dL 4.0 4.2 3.9  AST 10 - 35 U/L _2 ALT 9 - 46 U/L _3 Alk Phosphatase 40 - 115 U/L 67 79 58  Total Bilirubin 0.2 - 1.2 mg/dL 1.0 0.8 1.0    CBC Latest Ref Rng & Units 06/22/2015 12/12/2014 10/06/2014  WBC 3.8 - 10.8 K/uL 4.6 6.2 5.4  Hemoglobin 13.2 - 17.1 g/dL 13.4 12.4(L) 13.3  Hematocrit 38.5 - 50.0 % 39.7 36.5(L) 37.5(L)  Platelets 140 - 400 K/uL 217 265.0 278   Lab Results  Component Value Date   MCV 98.3 06/22/2015   MCV 93.6 12/12/2014   MCV 95.2 10/06/2014    Lab Results  Component Value Date   TSH 2.09 06/22/2015    BNP    Component Value Date/Time   BNP 1,282.9 (H) 06/19/2014 1630    ProBNP No results found for: PROBNP   Lipid Panel     Component Value Date/Time   CHOL 147 06/22/2015 0848   TRIG 66 06/22/2015 0848   HDL 34 (L) 06/22/2015 0848   CHOLHDL 4.3 06/22/2015 0848   VLDL 13 06/22/2015 0848   LDLCALC 100 06/22/2015 0848     RADIOLOGY: No results found.    ASSESSMENT AND PLAN: Mr. Erik [name] is a 80 year old gentleman who was found to have severe symptomatic critical aortic valve stenosis after he presented to the hospital following a presyncopal spell.  He successfully underwent aortic valve replacement surgery on 06/30/2014 with a 25 mm [facility] Ease bovine pericardial tissue valve.  He says we develop atrial flutter in October 2016 and underwent successful ablation.  He was taken off warfarin therapy as well as beta blocker therapy.  He continues to be on aspirin alone.  His blood pressure is stable and on repeat by me today was  stable.  His ECG shows sinus bradycardia with PACs and atrial bigeminal rhythm.  Resting pulse was 53.  He is asymptomatic with reference to this.  I reviewed his echo Doppler study from August 2017 which shows a well-seated aortic bioprosthesis.  He has normal systolic and diastolic function with an EF of 60-65% without wall motion abnormalities.  He had normal coronary arteries at catheterization.  I  reviewed laboratory from April 2017.  I reviewed his office visit evaluation with Dr. Ricard Dillon.  He will continue his current activity.  His weight is stable.  In April 2018 he will undergo a one-year follow-up echo Doppler study.  I will see him back in the office for follow-up evaluation.    Time spent: 25 minutes  Troy Sine, MD, [facility]  11/21/2015 5:53 PM

## 2016-05-20 ENCOUNTER — Ambulatory Visit (HOSPITAL_COMMUNITY): Payer: Medicare Other | Attending: Cardiology

## 2016-05-20 ENCOUNTER — Other Ambulatory Visit: Payer: Self-pay

## 2016-05-20 DIAGNOSIS — Z9889 Other specified postprocedural states: Secondary | ICD-10-CM | POA: Insufficient documentation

## 2016-05-20 DIAGNOSIS — I959 Hypotension, unspecified: Secondary | ICD-10-CM | POA: Insufficient documentation

## 2016-05-20 DIAGNOSIS — I350 Nonrheumatic aortic (valve) stenosis: Secondary | ICD-10-CM | POA: Insufficient documentation

## 2016-05-20 DIAGNOSIS — Z953 Presence of xenogenic heart valve: Secondary | ICD-10-CM | POA: Diagnosis not present

## 2016-05-20 DIAGNOSIS — I517 Cardiomegaly: Secondary | ICD-10-CM | POA: Diagnosis not present

## 2016-05-20 DIAGNOSIS — I5032 Chronic diastolic (congestive) heart failure: Secondary | ICD-10-CM | POA: Diagnosis not present

## 2016-06-02 ENCOUNTER — Telehealth: Payer: Self-pay | Admitting: Cardiovascular Disease

## 2016-06-02 NOTE — Telephone Encounter (Signed)
Pt has an appointment with Dr Claiborne Billings on 06-11-16,pt wants to make sure his lab order is downstairs. He says he always have lab work before his appointment with Dr Claiborne Billings.

## 2016-06-02 NOTE — Telephone Encounter (Signed)
DISCUSSED WITH  PT   TO  COME IN FASTING   AND  AFTER  APPT  WITH  DR KELLY  CAN SEND TO LAB  PT  AGREES .Adonis Housekeeper

## 2016-06-11 ENCOUNTER — Encounter: Payer: Self-pay | Admitting: Cardiovascular Disease

## 2016-06-11 ENCOUNTER — Ambulatory Visit (INDEPENDENT_AMBULATORY_CARE_PROVIDER_SITE_OTHER): Payer: Medicare Other | Admitting: Cardiovascular Disease

## 2016-06-11 VITALS — BP 124/64 | HR 54 | Ht 72.0 in | Wt 181.8 lb

## 2016-06-11 DIAGNOSIS — I483 Typical atrial flutter: Secondary | ICD-10-CM

## 2016-06-11 DIAGNOSIS — I498 Other specified cardiac arrhythmias: Secondary | ICD-10-CM

## 2016-06-11 DIAGNOSIS — I5032 Chronic diastolic (congestive) heart failure: Secondary | ICD-10-CM

## 2016-06-11 DIAGNOSIS — Z79899 Other long term (current) drug therapy: Secondary | ICD-10-CM

## 2016-06-11 DIAGNOSIS — Z125 Encounter for screening for malignant neoplasm of prostate: Secondary | ICD-10-CM

## 2016-06-11 DIAGNOSIS — E785 Hyperlipidemia, unspecified: Secondary | ICD-10-CM

## 2016-06-11 DIAGNOSIS — I350 Nonrheumatic aortic (valve) stenosis: Secondary | ICD-10-CM | POA: Diagnosis not present

## 2016-06-11 DIAGNOSIS — Z952 Presence of prosthetic heart valve: Secondary | ICD-10-CM

## 2016-06-11 LAB — CBC
HCT: 43.4 % (ref 38.5–50.0)
HEMOGLOBIN: 14.7 g/dL (ref 13.2–17.1)
MCH: 33.9 pg — AB (ref 27.0–33.0)
MCHC: 33.9 g/dL (ref 32.0–36.0)
MCV: 100 fL (ref 80.0–100.0)
MPV: 11.1 fL (ref 7.5–12.5)
Platelets: 231 10*3/uL (ref 140–400)
RBC: 4.34 MIL/uL (ref 4.20–5.80)
RDW: 13.3 % (ref 11.0–15.0)
WBC: 5.8 10*3/uL (ref 3.8–10.8)

## 2016-06-11 NOTE — Patient Instructions (Signed)
Your physician recommends that you return for lab work FASTING.  Your physician wants you to follow-up in: 6 MONTHS OR SOONER IF NEEDED. You will receive a reminder letter in the mail two months in advance. If you don't receive a letter, please call our office to schedule the follow-up appointment.   If you need a refill on your cardiac medications before your next appointment, please call your pharmacy.   

## 2016-06-11 NOTE — Progress Notes (Signed)
Patient ID: Erik [name], male   DOB: Oct 31, 1935, 81 y.o.   MRN: [id number]    PCP: Dr. Jani Gravel  HPI: Erik [name] is a 81 y.o. male who presents to the office today for an 8 monthfollow up cardiology evaluation.  I initially saw Erik [name] in cardiology consultation in April 2016 while in the hospital after he experienced a brief episode of chest discomfort associated with presyncope and transient visual blurring.  Physical examination suggested a murmur of severe aortic stenosis.  This was verified by echo Doppler data, which suggested critical AS.  I performed a right and left heart catheterization 06/22/2014.  He was found to have critical aortic valve stenosis without significant coronary obstructive disease with only a smooth 20% luminal narrowing of the proximal RCA and moderate global LV dysfunction with an EF of 35-40%.  He had upper normal right heart pressures.  He underwent aortic valve replacement surgery by Dr. Roxy Manns using a bioprosthetic tissue valve on 06/30/2014.  During his hospitalization, he was taken off all beta blockers because of bradycardia.  Ultimately, beta blocker was reinstituted.  He underwent cardioversion on 10/09/2014 after being on Coumadin anticoagulation for over a month.  He was seen on 10/25/2014 by [name], which time he was found to be back in atrial flutter.  He subsequent was referred to Dr. Rayann Heman who saw him on 11/29/2014.  He underwent successful atrial flutter ablation by Dr. Rayann Heman on 12/19/2014.  Subsequently, he was taken off essentially all medications with the exception of aspirin.  He is not aware of any recurrent atrial flutter or atrial fibrillation.    A follow-up echo Doppler study in 06/25/2015 showed an ejection fraction of 60-65% with normal wall motion and diastolic function.  His aortic valve bioprosthesis was well-seated.  There was no stenosis.  The mean gradient was a peak gradient 15 concordant with the valve.  His right atrium was mild  moderately dilated.  He has seen Dr. Roxy Manns for follow-up evaluation who felt he was stable from his surgical standpoint.  Erik [name] continues to feel well.  When I last saw him, he had an atrial bigeminal pattern.  He has had issues in the past with bradycardia. He is not on any medication except ASA 81 mg and denies any recurrent awareness of arrhythmia.  He continues to work in his son's [employer] and remains very active.  He denies chest pain, PND, orthopnea.  He denies presyncope or syncope.  He just completed a one-year follow-up echo Doppler study on 05/20/2016.  EF was 50-55%.  His bioprosthetic aortic valve was well-seated.  There was no aortic regurgitation or perivalvular leak.  Mean gradient was 7 and peak gradient 12.  There was trivial TR and PR and his left atrium was mildly dilated.  He presents for follow-up evaluation.  Past Medical History:  Diagnosis Date  . Aortic stenosis, severe   . Atrial flutter, post-operative 07/27/2014   typical   . Chronic diastolic congestive heart failure ([place])   . S/P aortic valve replacement with bioprosthetic valve 06/30/2014   25 mm [facility] Ease bovine pericardial tissue valve  . Sinus bradycardia     Past Surgical History:  Procedure Laterality Date  . AORTIC VALVE REPLACEMENT N/A 06/30/2014   Procedure: AORTIC VALVE REPLACEMENT (AVR);  Surgeon: Rexene Alberts, MD;  Location: [place];  Service: Open Heart Surgery;  Laterality: N/A;  . APPENDECTOMY    . CARDIOVERSION N/A 10/09/2014   Procedure: CARDIOVERSION;  Surgeon: Troy Sine, MD;  Location: [place];  Service: Cardiovascular;  Laterality: N/A;  . CATARACT EXTRACTION Bilateral ? 2013    & 2014  . ELECTROPHYSIOLOGIC STUDY N/A 12/19/2014   CTI ablation by Dr Rayann Heman  . HAND SURGERY    . INTRAOPERATIVE TRANSESOPHAGEAL ECHOCARDIOGRAM N/A 06/30/2014   Procedure: INTRAOPERATIVE TRANSESOPHAGEAL ECHOCARDIOGRAM;  Surgeon: Rexene Alberts, MD;  Location: [place];  Service:  Open Heart Surgery;  Laterality: N/A;  . LEFT AND RIGHT HEART CATHETERIZATION WITH CORONARY ANGIOGRAM N/A 06/22/2014   Procedure: LEFT AND RIGHT HEART CATHETERIZATION WITH CORONARY ANGIOGRAM;  Surgeon: Troy Sine, MD;  [facility];  Service: Cardiovascular;  Laterality: N/A;    No Known Allergies  Current Outpatient Prescriptions  Medication Sig Dispense Refill  . aspirin EC 81 MG tablet Take 81 mg by mouth daily.     No current facility-administered medications for this visit.     Social History   Social History  . Marital status: Married    Spouse name: N/A  . Number of children: N/A  . Years of education: N/A   Occupational History  . Not on file.   Social History Main Topics  . Smoking status: Never Smoker  . Smokeless tobacco: Never Used     Comment: smoked in college  . Alcohol use No     Comment: occ   . Drug use: No  . Sexual activity: Yes   Other Topics Concern  . Not on file   Social History Narrative   Pt lives in [place] with spouse.  Retired Scientist, physiological.    Family History  Problem Relation Age of Onset  . Stroke Mother   . Hypertension Mother   . Cancer Father     ROS General: Negative; No fevers, chills, or night sweats HEENT: Negative; No changes in vision or hearing, sinus congestion, difficulty swallowing Pulmonary: Negative; No cough, wheezing, shortness of breath, hemoptysis Cardiovascular: See HPI:  GI: Negative; No nausea, vomiting, diarrhea, or abdominal pain GU: Negative; No dysuria, hematuria, or difficulty voiding Musculoskeletal: Negative; no myalgias, joint pain, or weakness Hematologic: Negative; no easy bruising, bleeding Endocrine: Negative; no heat/cold intolerance; no diabetes, Neuro: Negative; no changes in balance, headaches Skin: Negative; No rashes or skin lesions Psychiatric: Negative; No behavioral problems, depression Sleep: Negative; No snoring,  daytime sleepiness, hypersomnolence, bruxism,  restless legs, hypnogognic hallucinations. Other comprehensive 14 point system review is negative   Physical Exam BP 124/64   Pulse (!) 54   Ht 6' (1.829 m)   Wt 181 lb 12.8 oz (82.5 kg)   BMI 24.66 kg/m    Repeat blood pressure by me was 122/70  Wt Readings from Last 3 Encounters:  06/11/16 181 lb 12.8 oz (82.5 kg)  11/21/15 176 lb 12.8 oz (80.2 kg)  07/09/15 173 lb 9.6 oz (78.7 kg)   General: Alert, oriented, no distress.  Skin: normal turgor, no rashes, warm and dry HEENT: Normocephalic, atraumatic. Pupils equal round and reactive to light; sclera anicteric; extraocular muscles intact, No lid lag; Nose without nasal septal hypertrophy; Mouth/Parynx benign; Mallinpatti scale 2 Neck: No JVD, no carotid bruits; normal carotid upstroke Lungs: clear to ausculatation and percussion bilaterally; no wheezing or rales, normal inspiratory and expiratory effort Chest wall: without tenderness to palpitation Heart: PMI not displaced, regular rhythm with occasional ectopic complex., s1 s2 normal, 1/6 systolic murmur in the aortic area concordant with his bioprosthesis, No diastolic murmur, no rubs, gallops, thrills, or heaves Abdomen: soft,  nontender; no hepatosplenomehaly, BS+; abdominal aorta nontender and not dilated by palpation. Back: no CVA tenderness Pulses: 2+  Musculoskeletal: full range of motion, normal strength, no joint deformities Extremities: Pulses 2+, no clubbing cyanosis or edema, Homan's sign negative  Neurologic: grossly nonfocal; Cranial nerves grossly wnl Psychologic: Normal mood and affect  ECG (independently read by me): Sinus bradycardia with first degree AV block.  PAC is in an atrial bigeminal rhythm.    September 2017 ECG (independently read by me): Sinus bradycardia 53 bpm.  First-degree AV block.  There are PACs and atrial bigeminal pattern.  He has poor anterior R-wave voltage.  January 2017 ECG (independently read by me): Sinus bradycardia 50 bpm with  first-degree AV block with a PR interval at 234 ms and evidence for occasional PAC.  September 2016 ECG (independently read by me): Atrial flutter with 4:1 block at 64 bpm.  09/11/2014 ECG (independently read by me): Atrial flutter with 41 block.  One isolated PVC.  Ventricular rate 67 bpm.  Nonspecific ST-T change  Prior ECG (independently read by me): Underlying atrial flutter with a ventricular rate at 88 with frequent PVCs in a bigeminal pattern transiently.  LABS:  BMP Latest Ref Rng & Units 06/22/2015 12/12/2014 10/06/2014  Glucose 65 - 99 mg/dL 85 97 90  BUN 7 - 25 mg/dL _0 Creatinine 0.70 - 1.18 mg/dL 1.01 1.12 1.15  Sodium 135 - 146 mmol/L 138 141 140  Potassium 3.5 - 5.3 mmol/L 4.7 4.9 5.0  Chloride 98 - 110 mmol/L 104 107 105  CO2 20 - 31 mmol/L _1 Calcium 8.6 - 10.3 mg/dL 9.1 9.6 9.7    Hepatic Function Latest Ref Rng & Units 06/22/2015 08/22/2014 06/29/2014  Total Protein 6.1 - 8.1 g/dL 7.0 7.7 7.1  Albumin 3.6 - 5.1 g/dL 4.0 4.2 3.9  AST 10 - 35 U/L _2 ALT 9 - 46 U/L _3 Alk Phosphatase 40 - 115 U/L 67 79 58  Total Bilirubin 0.2 - 1.2 mg/dL 1.0 0.8 1.0    CBC Latest Ref Rng & Units 06/22/2015 12/12/2014 10/06/2014  WBC 3.8 - 10.8 K/uL 4.6 6.2 5.4  Hemoglobin 13.2 - 17.1 g/dL 13.4 12.4(L) 13.3  Hematocrit 38.5 - 50.0 % 39.7 36.5(L) 37.5(L)  Platelets 140 - 400 K/uL 217 265.0 278   Lab Results  Component Value Date   MCV 98.3 06/22/2015   MCV 93.6 12/12/2014   MCV 95.2 10/06/2014    Lab Results  Component Value Date   TSH 2.09 06/22/2015    BNP    Component Value Date/Time   BNP 1,282.9 (H) 06/19/2014 1630    ProBNP No results found for: PROBNP   Lipid Panel     Component Value Date/Time   CHOL 147 06/22/2015 0848   TRIG 66 06/22/2015 0848   HDL 34 (L) 06/22/2015 0848   CHOLHDL 4.3 06/22/2015 0848   VLDL 13 06/22/2015 0848   LDLCALC 100 06/22/2015 0848     RADIOLOGY: No results found.  IMPRESSION:  1. Typical atrial  flutter ([place])   2. S/P AVR (aortic valve replacement)   3. Aortic stenosis, severe   4. Atrial bigeminy   5. Chronic diastolic congestive heart failure ([place])   6. Screening PSA (prostate specific antigen)   7. Hyperlipidemia, unspecified hyperlipidemia type   8. Medication management     ASSESSMENT AND PLAN: Mr. Erik [name] is a young appearing 81 year old gentleman who was found to have  severe symptomatic critical aortic valve stenosis after he presented to the hospital following a presyncopal spell.  He successfully underwent aortic valve replacement surgery on 06/30/2014 with a 25 mm [facility] Ease bovine pericardial tissue valve.  He developed atrial flutter in October 2016 and underwent successful ablation.  He was taken off warfarin therapy as well as beta blocker therapy.  He continues to be on aspirin alone.  His blood pressure is stable and on repeat by me today was stable.  The past, he had significant bradycardia and as result is not on any rate control medications.  His ECG today again shows sinus bradycardia with PACs in a atrial bigeminal rhythm.  This was present on his last evaluation.  He is completely asymptomatic with reference to this.  Remains active.  He is working at his [employer] and [place] is to place on the palate.  He is fasting today and a complete set of laboratory will be obtained.  I will also check a magnesium and thyroid function studies.  He's not had his PSA checked recently and is and I will check this for prostate screening.  Since his last has not been checked in over 4 years.  I will notify him regarding his blood work.  As long as he remains stable I will see him in 6 months for reevaluation.  Time spent: 25 minutes  Troy Sine, MD, [facility]  06/11/2016 7:30 PM

## 2016-06-12 LAB — LIPID PANEL
CHOL/HDL RATIO: 4.8 ratio (ref <5.0)
Cholesterol: 168 mg/dL (ref <200)
HDL: 35 mg/dL — ABNORMAL LOW (ref >40)
LDL CALC: 114 mg/dL — AB (ref <100)
Triglycerides: 95 mg/dL (ref <150)
VLDL: 19 mg/dL (ref <30)

## 2016-06-12 LAB — COMPREHENSIVE METABOLIC PANEL
ALK PHOS: 66 U/L (ref 40–115)
ALT: 9 U/L (ref 9–46)
AST: 13 U/L (ref 10–35)
Albumin: 4.1 g/dL (ref 3.6–5.1)
BILIRUBIN TOTAL: 0.9 mg/dL (ref 0.2–1.2)
BUN: 16 mg/dL (ref 7–25)
CO2: 24 mmol/L (ref 20–31)
CREATININE: 1.2 mg/dL — AB (ref 0.70–1.18)
Calcium: 9.7 mg/dL (ref 8.6–10.3)
Chloride: 106 mmol/L (ref 98–110)
GLUCOSE: 96 mg/dL (ref 65–99)
Potassium: 5 mmol/L (ref 3.5–5.3)
SODIUM: 141 mmol/L (ref 135–146)
TOTAL PROTEIN: 7.6 g/dL (ref 6.1–8.1)

## 2016-06-12 LAB — PSA: PSA: 2.1 ng/mL (ref <=4.0)

## 2016-06-12 LAB — TSH: TSH: 1.77 mIU/L (ref 0.40–4.50)

## 2016-06-12 LAB — MAGNESIUM: MAGNESIUM: 2.3 mg/dL (ref 1.5–2.5)

## 2016-06-30 ENCOUNTER — Telehealth: Payer: Self-pay | Admitting: *Deleted

## 2016-06-30 NOTE — Telephone Encounter (Signed)
Patient notified of lab results and recommendations. 

## 2016-06-30 NOTE — Telephone Encounter (Signed)
-----   Message from Troy Sine, MD sent at 06/30/2016  4:27 PM EDT ----- Labs stable.  LDL cholesterol is mildly increased at 114.  Consider improved diet;  recheck in 66 months

## 2016-11-04 DIAGNOSIS — Z961 Presence of intraocular lens: Secondary | ICD-10-CM | POA: Diagnosis not present

## 2016-11-04 DIAGNOSIS — H02834 Dermatochalasis of left upper eyelid: Secondary | ICD-10-CM | POA: Diagnosis not present

## 2016-11-04 DIAGNOSIS — H04123 Dry eye syndrome of bilateral lacrimal glands: Secondary | ICD-10-CM | POA: Diagnosis not present

## 2016-11-04 DIAGNOSIS — H02831 Dermatochalasis of right upper eyelid: Secondary | ICD-10-CM | POA: Diagnosis not present

## 2016-11-10 ENCOUNTER — Telehealth: Payer: Self-pay | Admitting: Cardiovascular Disease

## 2016-11-10 NOTE — Telephone Encounter (Signed)
New message       Pt has an appt scheduled 01-16-17.  Calling to see if he needs an echo prior to appt?

## 2016-11-10 NOTE — Telephone Encounter (Signed)
Spoke to pt. Informed that last echo was normal and pt did not need another echo prior to appt.

## 2017-01-16 ENCOUNTER — Ambulatory Visit: Payer: Medicare Other | Admitting: Cardiovascular Disease

## 2017-01-27 ENCOUNTER — Ambulatory Visit: Payer: Medicare Other | Admitting: Cardiovascular Disease

## 2017-01-29 ENCOUNTER — Encounter: Payer: Self-pay | Admitting: Cardiovascular Disease

## 2017-01-29 ENCOUNTER — Ambulatory Visit (INDEPENDENT_AMBULATORY_CARE_PROVIDER_SITE_OTHER): Payer: Medicare Other | Admitting: Cardiovascular Disease

## 2017-01-29 VITALS — BP 124/60 | HR 54 | Ht 74.0 in | Wt 182.0 lb

## 2017-01-29 DIAGNOSIS — E785 Hyperlipidemia, unspecified: Secondary | ICD-10-CM | POA: Diagnosis not present

## 2017-01-29 DIAGNOSIS — Z952 Presence of prosthetic heart valve: Secondary | ICD-10-CM

## 2017-01-29 DIAGNOSIS — I4892 Unspecified atrial flutter: Secondary | ICD-10-CM | POA: Diagnosis not present

## 2017-01-29 DIAGNOSIS — Z79899 Other long term (current) drug therapy: Secondary | ICD-10-CM

## 2017-01-29 DIAGNOSIS — R001 Bradycardia, unspecified: Secondary | ICD-10-CM

## 2017-01-29 NOTE — Progress Notes (Signed)
Patient ID: Erik [name], male   DOB: Apr 22, 1935, 81 y.o.   MRN: [id number]    PCP: Dr. Jani Gravel  HPI: Erik [name] is a 81 y.o. male who presents to the office today for an 8 month follow up cardiology evaluation.  I initially saw Erik [name] in cardiology consultation in April 2016 while in the hospital after he experienced a brief episode of chest discomfort associated with presyncope and transient visual blurring.  Physical examination suggested a murmur of severe aortic stenosis.  This was verified by echo Doppler data, which suggested critical AS.  I performed a right and left heart catheterization 06/22/2014.  He was found to have critical aortic valve stenosis without significant coronary obstructive disease with only a smooth 20% luminal narrowing of the proximal RCA and moderate global LV dysfunction with an EF of 35-40%.  He had upper normal right heart pressures.  He underwent aortic valve replacement surgery by Dr. Roxy Manns using a bioprosthetic tissue valve on 06/30/2014.  During his hospitalization, he was taken off all beta blockers because of bradycardia.  Ultimately, beta blocker was reinstituted.  He underwent cardioversion on 10/09/2014 after being on Coumadin anticoagulation for over a month.  He was seen on 10/25/2014 by [name], which time he was found to be back in atrial flutter.  He subsequent was referred to Dr. Rayann Heman who saw him on 11/29/2014.  He underwent successful atrial flutter ablation by Dr. Rayann Heman on 12/19/2014.  Subsequently, he was taken off essentially all medications with the exception of aspirin.  He is not aware of any recurrent atrial flutter or atrial fibrillation.    A follow-up echo Doppler study in 06/25/2015 showed an ejection fraction of 60-65% with normal wall motion and diastolic function.  His aortic valve bioprosthesis was well-seated.  There was no stenosis.  The mean gradient was a peak gradient 15 concordant with the valve.  His right atrium was mild  moderately dilated.  He has seen Dr. Roxy Manns for follow-up evaluation who felt he was stable from his surgical standpoint.  Erik [name] continues to feel well.  When I last saw him, he had an atrial bigeminal pattern.  He has had issues in the past with bradycardia. He is not on any medication except ASA 81 mg and denies any recurrent awareness of arrhythmia.  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  He denies chest pain, PND, orthopnea.  He denies presyncope or syncope.  A one-year follow-up echo Doppler study on 05/20/2016 revealed an ejection fraction at  50-55%.  His bioprosthetic aortic valve was well-seated.  There was no aortic regurgitation or perivalvular leak.  Mean gradient was 7 and peak gradient 12.  There was trivial TR and PR and his left atrium was mildly dilated.   Since I last saw him, he has continued to be asymptomatic and denies chest pain, shortness of breath, dizziness, or palpitations.  He remains very active.  He has been fixing up his house in anticipation of selling his house and ultimately moving to [facility].  He had lab work in the spring.  At that time, his LDL had increased to 114 when 2 years ago.  It was 88 and one year ago 100.  He presents for evaluation.  Past Medical History:  Diagnosis Date  . Aortic stenosis, severe   . Atrial flutter, post-operative 07/27/2014   typical   . Chronic diastolic congestive heart failure ([place])   . S/P  aortic valve replacement with bioprosthetic valve 06/30/2014   25 mm [facility] Ease bovine pericardial tissue valve  . Sinus bradycardia     Past Surgical History:  Procedure Laterality Date  . A-Flutter Ablation N/A 12/19/2014   Performed by Thompson Grayer, MD at [place] CV LAB  . AORTIC VALVE REPLACEMENT (AVR) N/A 06/30/2014   Performed by Rexene Alberts, MD at [place]    . CARDIOVERSION N/A 10/09/2014   Performed by Troy Sine, MD at [place]  .  CATARACT EXTRACTION Bilateral ? 2013    & 2014  . HAND SURGERY    . INTRAOPERATIVE TRANSESOPHAGEAL ECHOCARDIOGRAM N/A 06/30/2014   Performed by Rexene Alberts, MD at [place]  . LEFT AND RIGHT HEART CATHETERIZATION WITH CORONARY ANGIOGRAM N/A 06/22/2014   Performed by Troy Sine, MD at [facility] CATH LAB    No Known Allergies  Current Outpatient Medications  Medication Sig Dispense Refill  . aspirin EC 81 MG tablet Take 81 mg by mouth daily.     No current facility-administered medications for this visit.     Social History   Socioeconomic History  . Marital status: Married    Spouse name: Not on file  . Number of children: Not on file  . Years of education: Not on file  . Highest education level: Not on file  Social Needs  . Financial resource strain: Not on file  . Food insecurity - worry: Not on file  . Food insecurity - inability: Not on file  . Transportation needs - medical: Not on file  . Transportation needs - non-medical: Not on file  Occupational History  . Not on file  Tobacco Use  . Smoking status: Never Smoker  . Smokeless tobacco: Never Used  . Tobacco comment: smoked in college  Substance and Sexual Activity  . Alcohol use: No    Comment: occ   . Drug use: No  . Sexual activity: Yes  Other Topics Concern  . Not on file  Social History Narrative   Pt lives in [place] with spouse.  Retired Scientist, physiological.    Family History  Problem Relation Age of Onset  . Stroke Mother   . Hypertension Mother   . Cancer Father     ROS General: Negative; No fevers, chills, or night sweats HEENT: Negative; No changes in vision or hearing, sinus congestion, difficulty swallowing Pulmonary: Negative; No cough, wheezing, shortness of breath, hemoptysis Cardiovascular: See HPI:  GI: Negative; No nausea, vomiting, diarrhea, or abdominal pain GU: Negative; No dysuria, hematuria, or difficulty voiding Musculoskeletal: Negative; no myalgias, joint pain, or  weakness Hematologic: Negative; no easy bruising, bleeding Endocrine: Negative; no heat/cold intolerance; no diabetes, Neuro: Negative; no changes in balance, headaches Skin: Negative; No rashes or skin lesions Psychiatric: Negative; No behavioral problems, depression Sleep: Negative; No snoring,  daytime sleepiness, hypersomnolence, bruxism, restless legs, hypnogognic hallucinations. Other comprehensive 14 point system review is negative   Physical Exam BP 124/60   Pulse (!) 54   Ht '6\' 2"'$  (1.88 m)   Wt 182 lb (82.6 kg)   BMI 23.37 kg/m    Repeat blood pressure by me was 130/70  Wt Readings from Last 3 Encounters:  01/29/17 182 lb (82.6 kg)  06/11/16 181 lb 12.8 oz (82.5 kg)  11/21/15 176 lb 12.8 oz (80.2 kg)   General: Alert, oriented, no distress.  Skin: normal turgor, no rashes, warm and dry HEENT: Normocephalic, atraumatic. Pupils equal  round and reactive to light; sclera anicteric; extraocular muscles intact;  Nose without nasal septal hypertrophy Mouth/Parynx benign; Mallinpatti scale 2 Neck: No JVD, no carotid bruits; normal carotid upstroke Lungs: clear to ausculatation and percussion; no wheezing or rales Chest wall: without tenderness to palpitation Heart: PMI not displaced, bradycardic in the 50s with mild heart rate irregularity and occasional ectopic complexes, s1 s2 normal, 4-2/5 systolic murmur in the aortic area concordant with his bioprosthesis., no diastolic murmur, no rubs, gallops, thrills, or heaves Abdomen: soft, nontender; no hepatosplenomehaly, BS+; abdominal aorta nontender and not dilated by palpation. Back: no CVA tenderness Pulses 2+ Musculoskeletal: full range of motion, normal strength, no joint deformities Extremities: no clubbing cyanosis or edema, Homan's sign negative  Neurologic: grossly nonfocal; Cranial nerves grossly wnl Psychologic: Normal mood and affect   ECG (independently read by me): Sinus bradycardia 54 bpm with sinus arrhythmia.   First degree AV block with a PR interval at 214 ms.  PAC.  Poor anterior R-wave progression V1 through V4.  March 2018 ECG (independently read by me): Sinus bradycardia with first degree AV block.  PAC is in an atrial bigeminal rhythm.    September 2017 ECG (independently read by me): Sinus bradycardia 53 bpm.  First-degree AV block.  There are PACs and atrial bigeminal pattern.  He has poor anterior R-wave voltage.  January 2017 ECG (independently read by me): Sinus bradycardia 50 bpm with first-degree AV block with a PR interval at 234 ms and evidence for occasional PAC.  September 2016 ECG (independently read by me): Atrial flutter with 4:1 block at 64 bpm.  09/11/2014 ECG (independently read by me): Atrial flutter with 41 block.  One isolated PVC.  Ventricular rate 67 bpm.  Nonspecific ST-T change  Prior ECG (independently read by me): Underlying atrial flutter with a ventricular rate at 88 with frequent PVCs in a bigeminal pattern transiently.  LABS:  BMP Latest Ref Rng & Units 06/11/2016 06/22/2015 12/12/2014  Glucose 65 - 99 mg/dL 96 85 97  BUN 7 - 25 mg/dL '16 13 14  '$ Creatinine 0.70 - 1.18 mg/dL 1.20(H) 1.01 1.12  Sodium 135 - 146 mmol/L 141 138 141  Potassium 3.5 - 5.3 mmol/L 5.0 4.7 4.9  Chloride 98 - 110 mmol/L 106 104 107  CO2 20 - 31 mmol/L '24 25 29  '$ Calcium 8.6 - 10.3 mg/dL 9.7 9.1 9.6    Hepatic Function Latest Ref Rng & Units 06/11/2016 06/22/2015 08/22/2014  Total Protein 6.1 - 8.1 g/dL 7.6 7.0 7.7  Albumin 3.6 - 5.1 g/dL 4.1 4.0 4.2  AST 10 - 35 U/L '13 14 14  '$ ALT 9 - 46 U/L '9 9 13  '$ Alk Phosphatase 40 - 115 U/L 66 67 79  Total Bilirubin 0.2 - 1.2 mg/dL 0.9 1.0 0.8    CBC Latest Ref Rng & Units 06/11/2016 06/22/2015 12/12/2014  WBC 3.8 - 10.8 K/uL 5.8 4.6 6.2  Hemoglobin 13.2 - 17.1 g/dL 14.7 13.4 12.4(L)  Hematocrit 38.5 - 50.0 % 43.4 39.7 36.5(L)  Platelets 140 - 400 K/uL 231 217 265.0   Lab Results  Component Value Date   MCV 100.0 06/11/2016   MCV 98.3 06/22/2015     MCV 93.6 12/12/2014    Lab Results  Component Value Date   TSH 1.77 06/11/2016    BNP    Component Value Date/Time   BNP 1,282.9 (H) 06/19/2014 1630    ProBNP No results found for: PROBNP   Lipid Panel     Component  Value Date/Time   CHOL 168 06/11/2016 1120   TRIG 95 06/11/2016 1120   HDL 35 (L) 06/11/2016 1120   CHOLHDL 4.8 06/11/2016 1120   VLDL 19 06/11/2016 1120   LDLCALC 114 (H) 06/11/2016 1120     RADIOLOGY: No results found.  IMPRESSION:  1. S/P AVR (aortic valve replacement) for severe AS   2. Hyperlipidemia, unspecified hyperlipidemia type   3. Medication management   4. Atrial flutter, unspecified type ([place])   5. Sinus bradycardia     ASSESSMENT AND PLAN: Mr. Promise Bushong is a young appearing 81 year-old gentleman who was found to have severe symptomatic critical aortic valve stenosis after he presented to the hospital following a presyncopal spell in 2016.  He successfully underwent aortic valve replacement surgery on 06/30/2014 with a 25 mm [facility] Ease bovine pericardial tissue valve.  He developed atrial flutter in October 2016 and underwent successful ablation.  He was taken off warfarin therapy as well as beta blocker therapy.  He continues to be on aspirin alone.  His blood pressure today is stable and he is not on any medications with the exception of aspirin.  Remotely he had developed significant bradycardia and as result is not on any rate control medications.  He has been noted to have atrial bigeminal rhythm in the past.  His ECG today shows sinus bradycardia with sinus arrhythmia with heart rate in the mid 50s.  On exam, it did appear that he was having some PACs.  Due to his remote bradycardia I will not institute beta blocker therapy.  I reviewed his blood work from March 2018.  Repeat blood work will be obtained in the fasting state.  Clinically he is doing well.  He is staying very active and denies any exertional symptomatology.  He  will most likely be moving to the [facility] area after he sells his house, but intends to come back to [place] to see his physicians.  I will see him in 6 months for cardiology reevaluation.    Time spent: 25 minutes  Troy Sine, MD, [place]  01/31/2017 6:07 PM

## 2017-01-29 NOTE — Patient Instructions (Signed)
Medication Instructions:  Your physician recommends that you continue on your current medications as directed. Please refer to the Current Medication list given to you today.  Labwork: Please return for FASTING labs in February (CMET, CBC, Lipid, TSH)-lab slips provided  Follow-Up: Your physician wants you to follow-up in: 6 months with Dr. Claiborne Billings.  You will receive a reminder letter in the mail two months in advance. If you don't receive a letter, please call our office to schedule the follow-up appointment.   Any Other Special Instructions Will Be Listed Below (If Applicable).     If you need a refill on your cardiac medications before your next appointment, please call your pharmacy.

## 2017-01-31 ENCOUNTER — Encounter: Payer: Self-pay | Admitting: Cardiovascular Disease

## 2017-02-27 IMAGING — CR DG CHEST 1V PORT
1 series · 2 of 2 positions shown · non-contrast
Comparison: 06/19/2014.

CLINICAL DATA: Postop day 1 from aortic valve replacement.

EXAM:
PORTABLE CHEST - 1 VIEW

[Series 1: ap · 0.17mm/px · 2 of 2 slices shown]
[im 1/2]
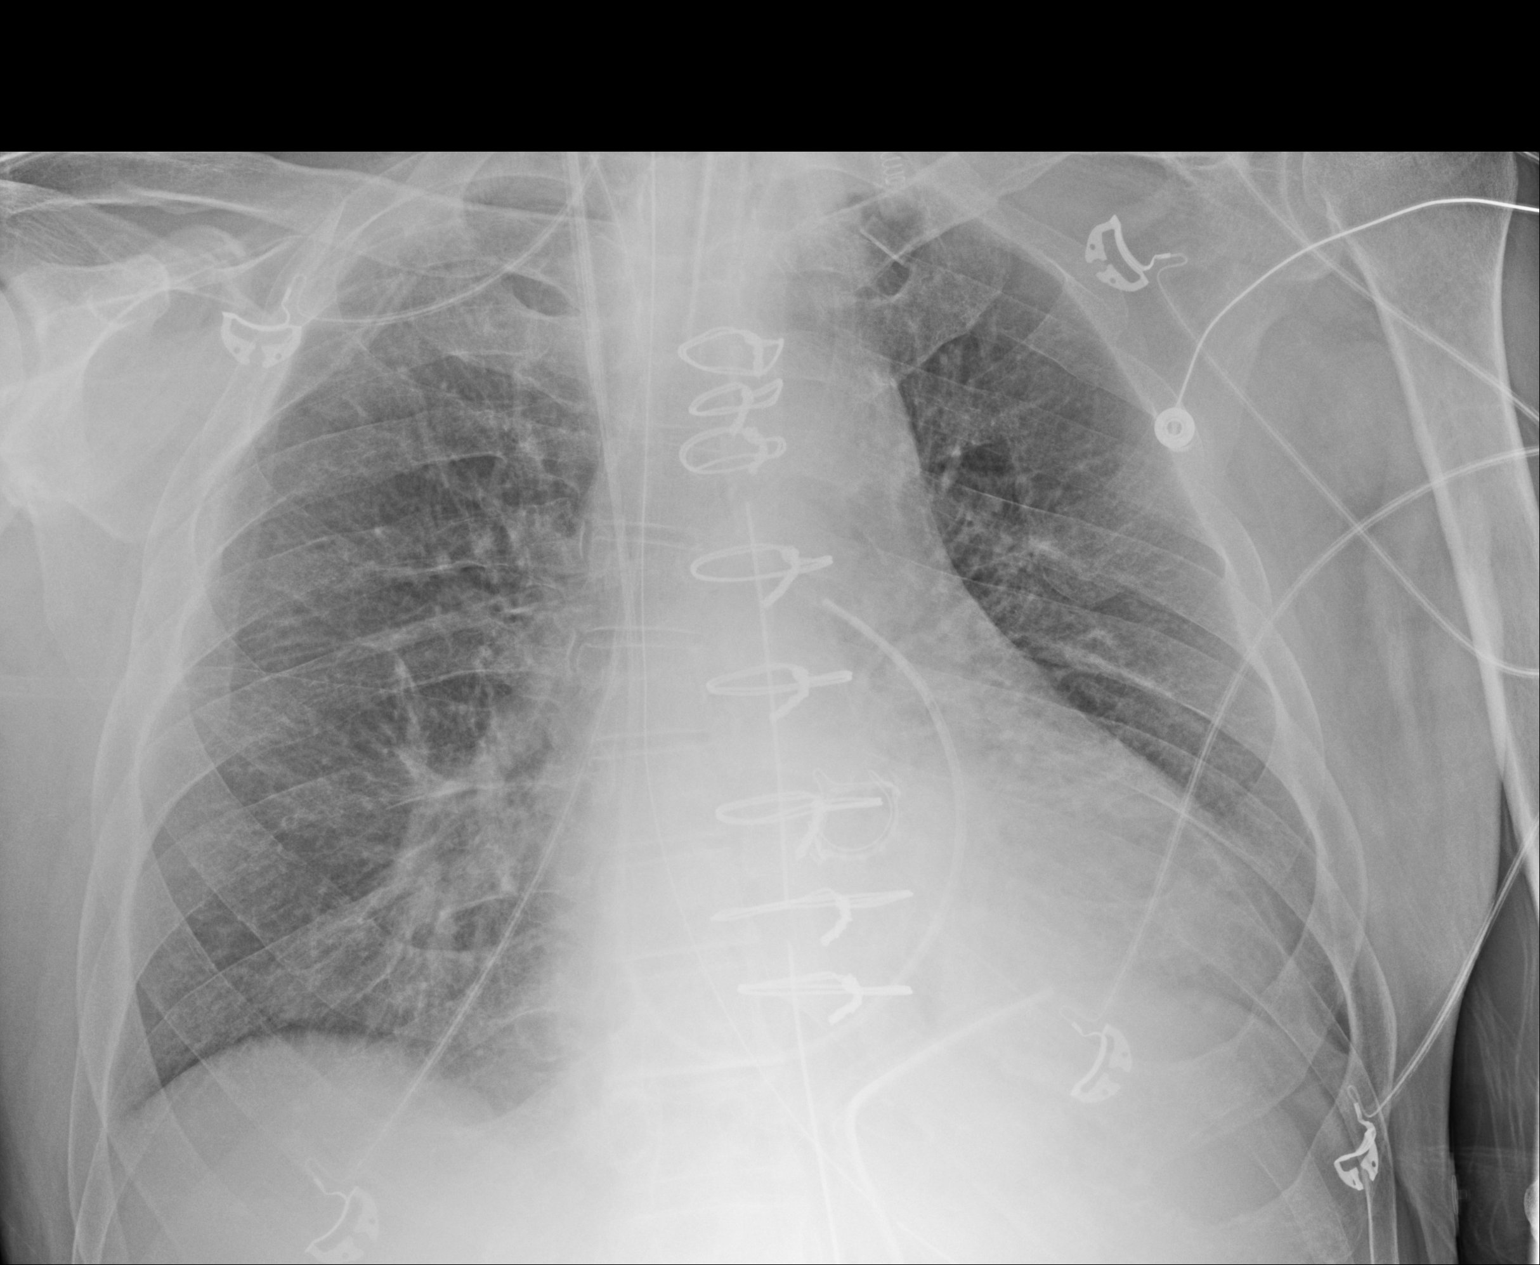
[im 2/2]
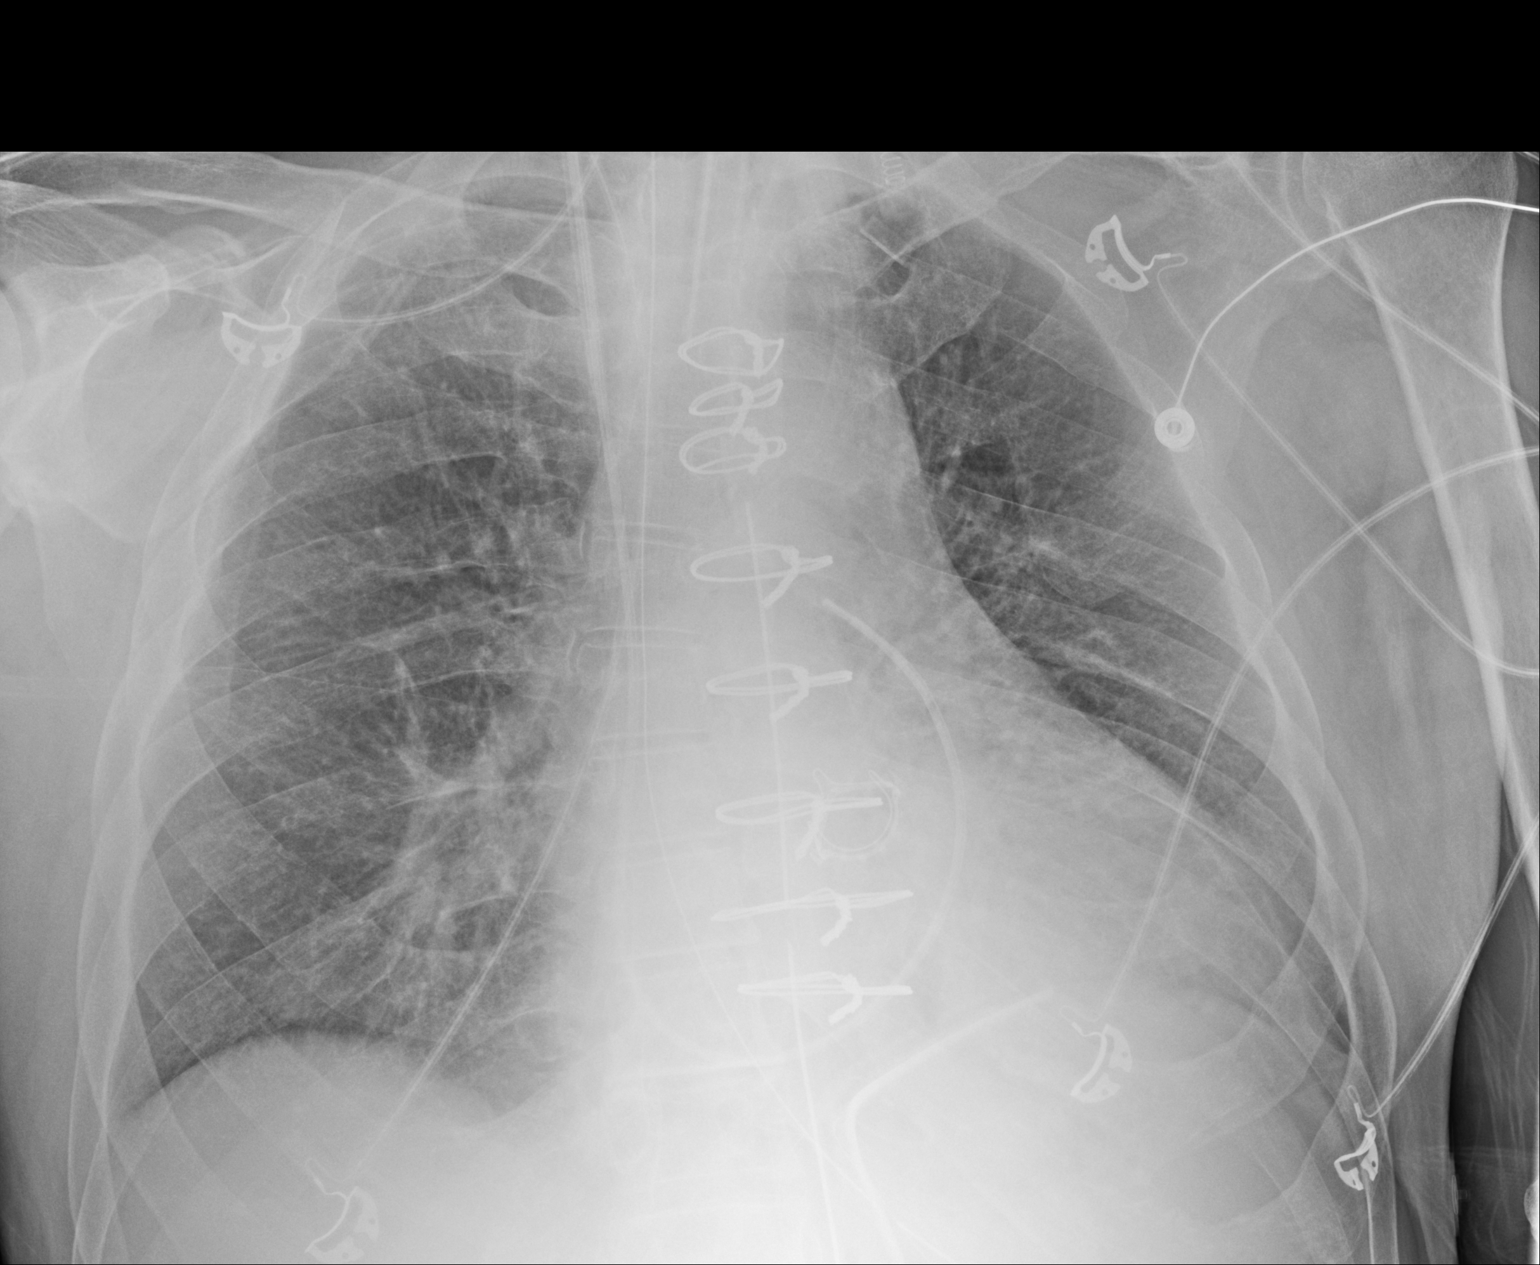

[2 of 2 positions shown; findings below may reference images not displayed]

FINDINGS: Since prior study, cardiac surgery has been performed with aortic
valve replacement. Standard lines and tubes are in place, including:
An endotracheal tube with its tip projecting 5.2 cm above the
Carina, a right internal jugular Swan-Ganz catheter with its tip
angle and towards the right pulmonary artery, a nasogastric tube
passing below the diaphragm into the stomach, a mediastinal tube and
a left inferior hemi thorax chest tube.

Cardiac silhouette is mildly enlarged but normal in configuration.
There is no mediastinal widening.

There are prominent interstitial markings bilaterally similar to the
prior exam without overt pulmonary edema. Mild basilar atelectasis
is noted, left greater than right. No gross pneumothorax noted on
this semi-erect study.
IMPRESSION: 1. Status post cardiac surgery. No evidence of an operative
complication.
2. Support apparatus is well positioned.

## 2017-02-28 IMAGING — CR DG CHEST 1V PORT
1 series · 1 of 1 positions shown · non-contrast
Comparison: 06/30/2014

CLINICAL DATA: Atelectasis

EXAM:
PORTABLE CHEST - 1 VIEW

[AP]
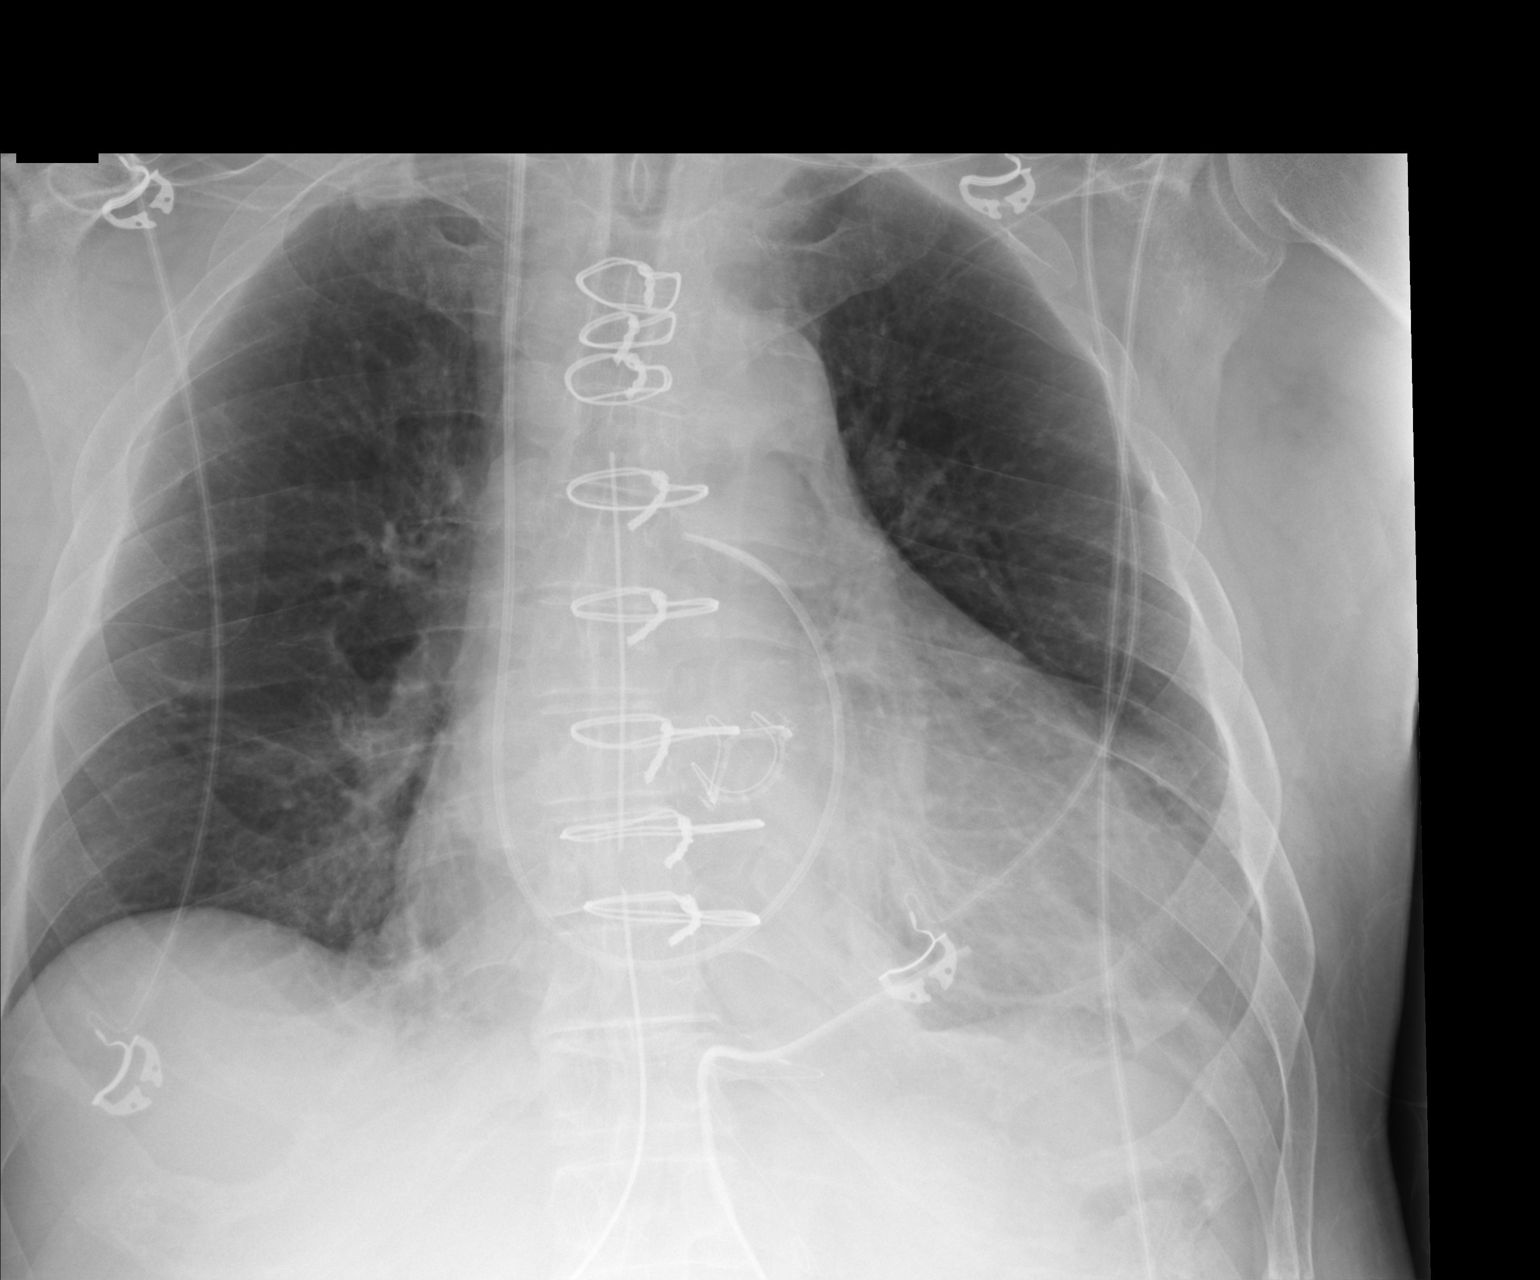

[1 of 1 positions shown; findings below may reference images not displayed]

FINDINGS: Endotracheal tube and nasogastric catheter been removed in the
interval. The pericardial drain and mediastinal drain remain in
place. The Swan-Ganz catheter is again noted in the pulmonary
outflow tract. The lungs are well aerated bilaterally with only
minimal left basilar atelectasis consistent with the recent surgery.
No pneumothorax is noted.
IMPRESSION: Minimal left basilar atelectasis.

Tubes and lines as described.

## 2017-03-01 IMAGING — CR DG CHEST 1V PORT
1 series · 1 of 1 positions shown · non-contrast
Comparison: 07/01/2014

CLINICAL DATA: Aortic valve replacement surgery.

EXAM:
PORTABLE CHEST - 1 VIEW

[AP]
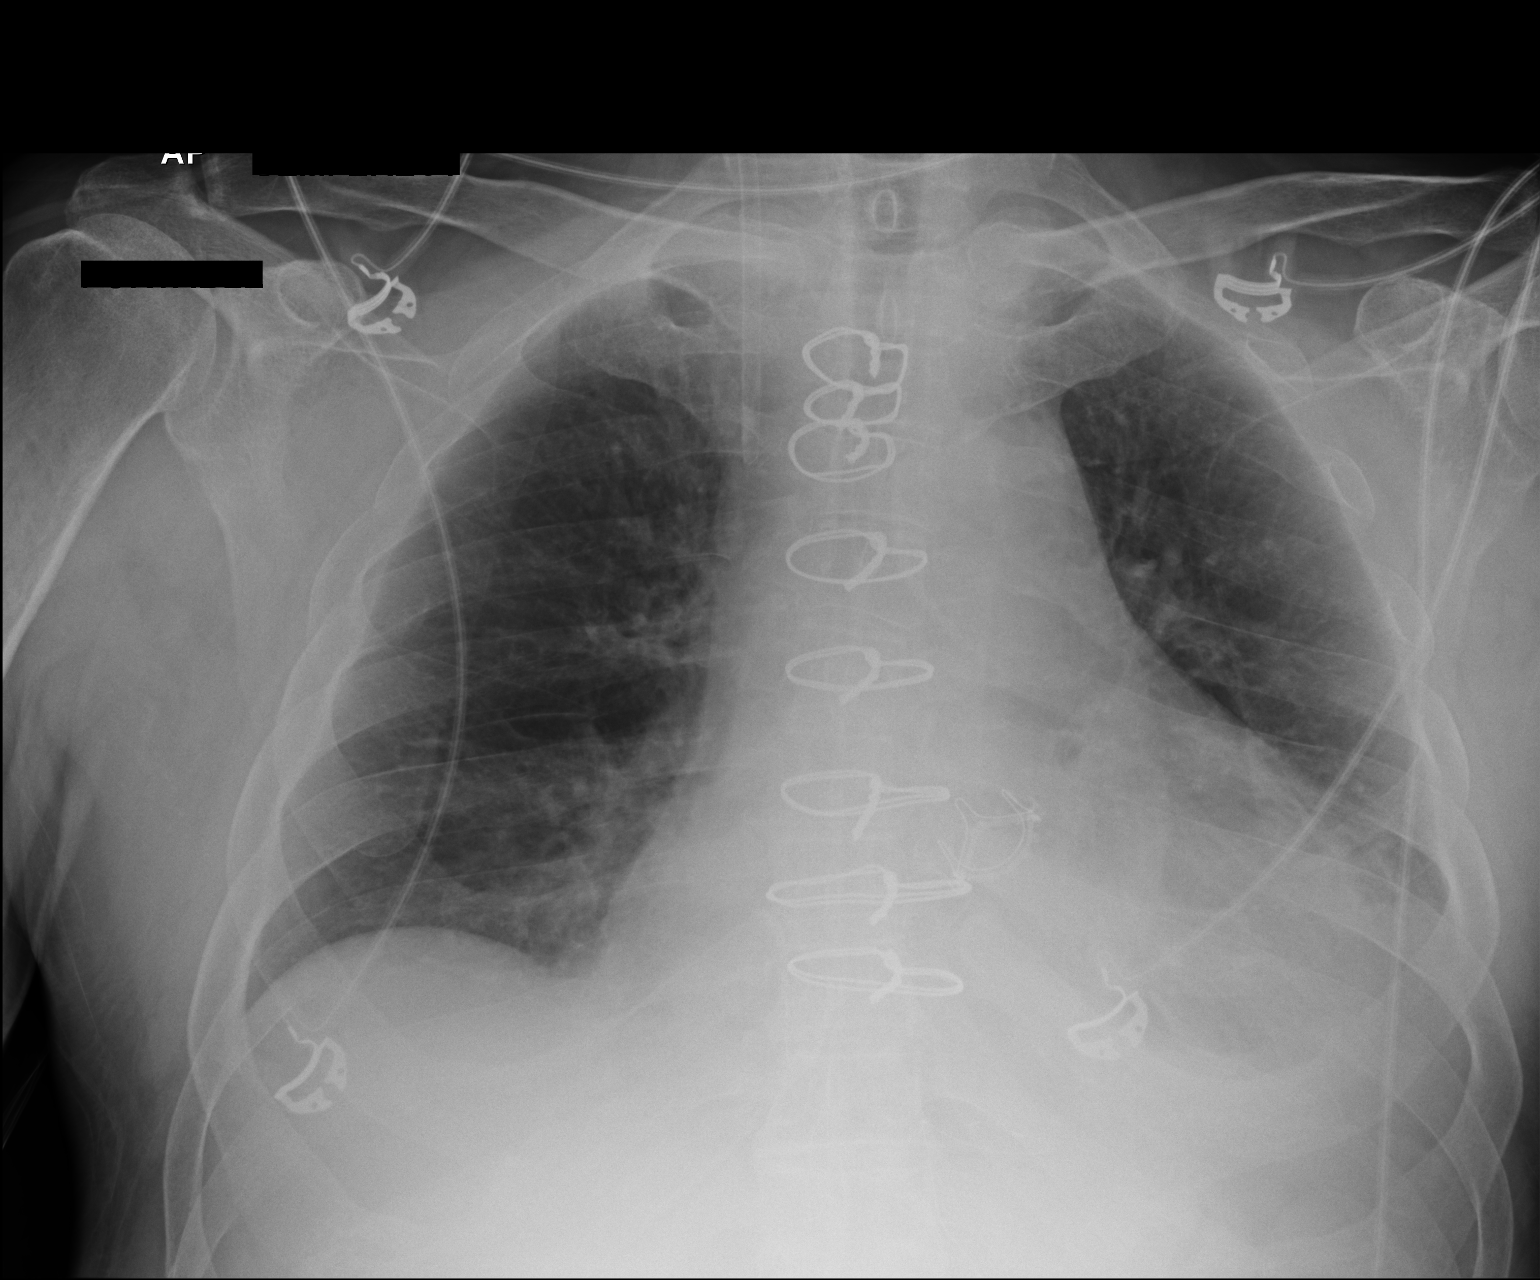

[1 of 1 positions shown; findings below may reference images not displayed]

FINDINGS: The Swan-Ganz catheter has been removed. The right IJ Cordis is
still in place. The mediastinal drain tube is been removed. Stable
cardiac enlargement, left pleural effusion and bibasilar
atelectasis. No pulmonary edema or pneumothorax.
IMPRESSION: Removal of Swan-Ganz catheter and mediastinal drain tube.

Stable cardiac enlargement, left pleural effusion and bibasilar
atelectasis.

## 2017-03-30 IMAGING — CR DG CHEST 2V
2 series · 2 of 2 positions shown · non-contrast
Comparison: PA and lateral chest of July 14, 2014

CLINICAL DATA: Status post aortic valve replacement on June 30, 2014, currently asymptomatic; history of previous tobacco use.

EXAM:
CHEST  2 VIEW

[w chest pa]
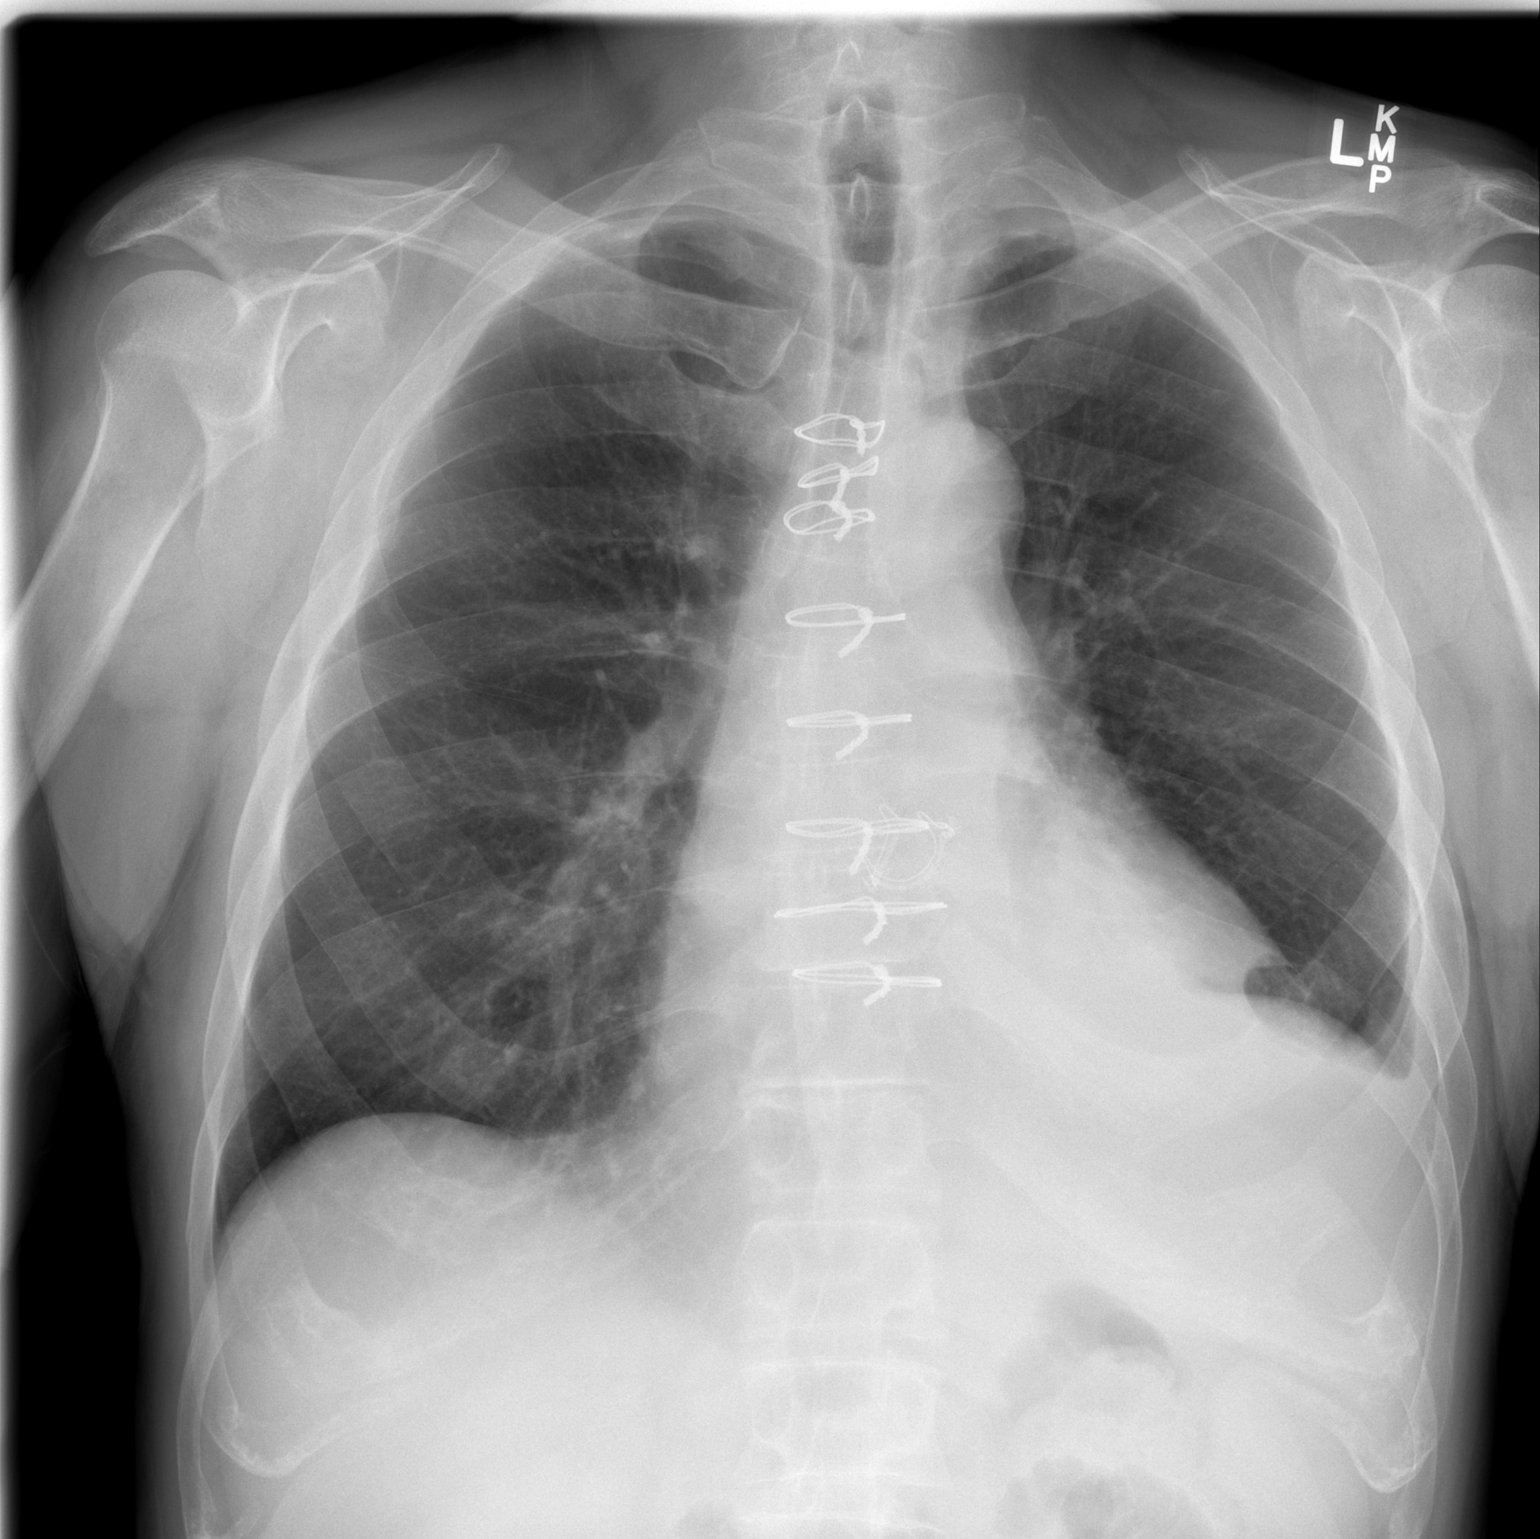

[w chest lat]
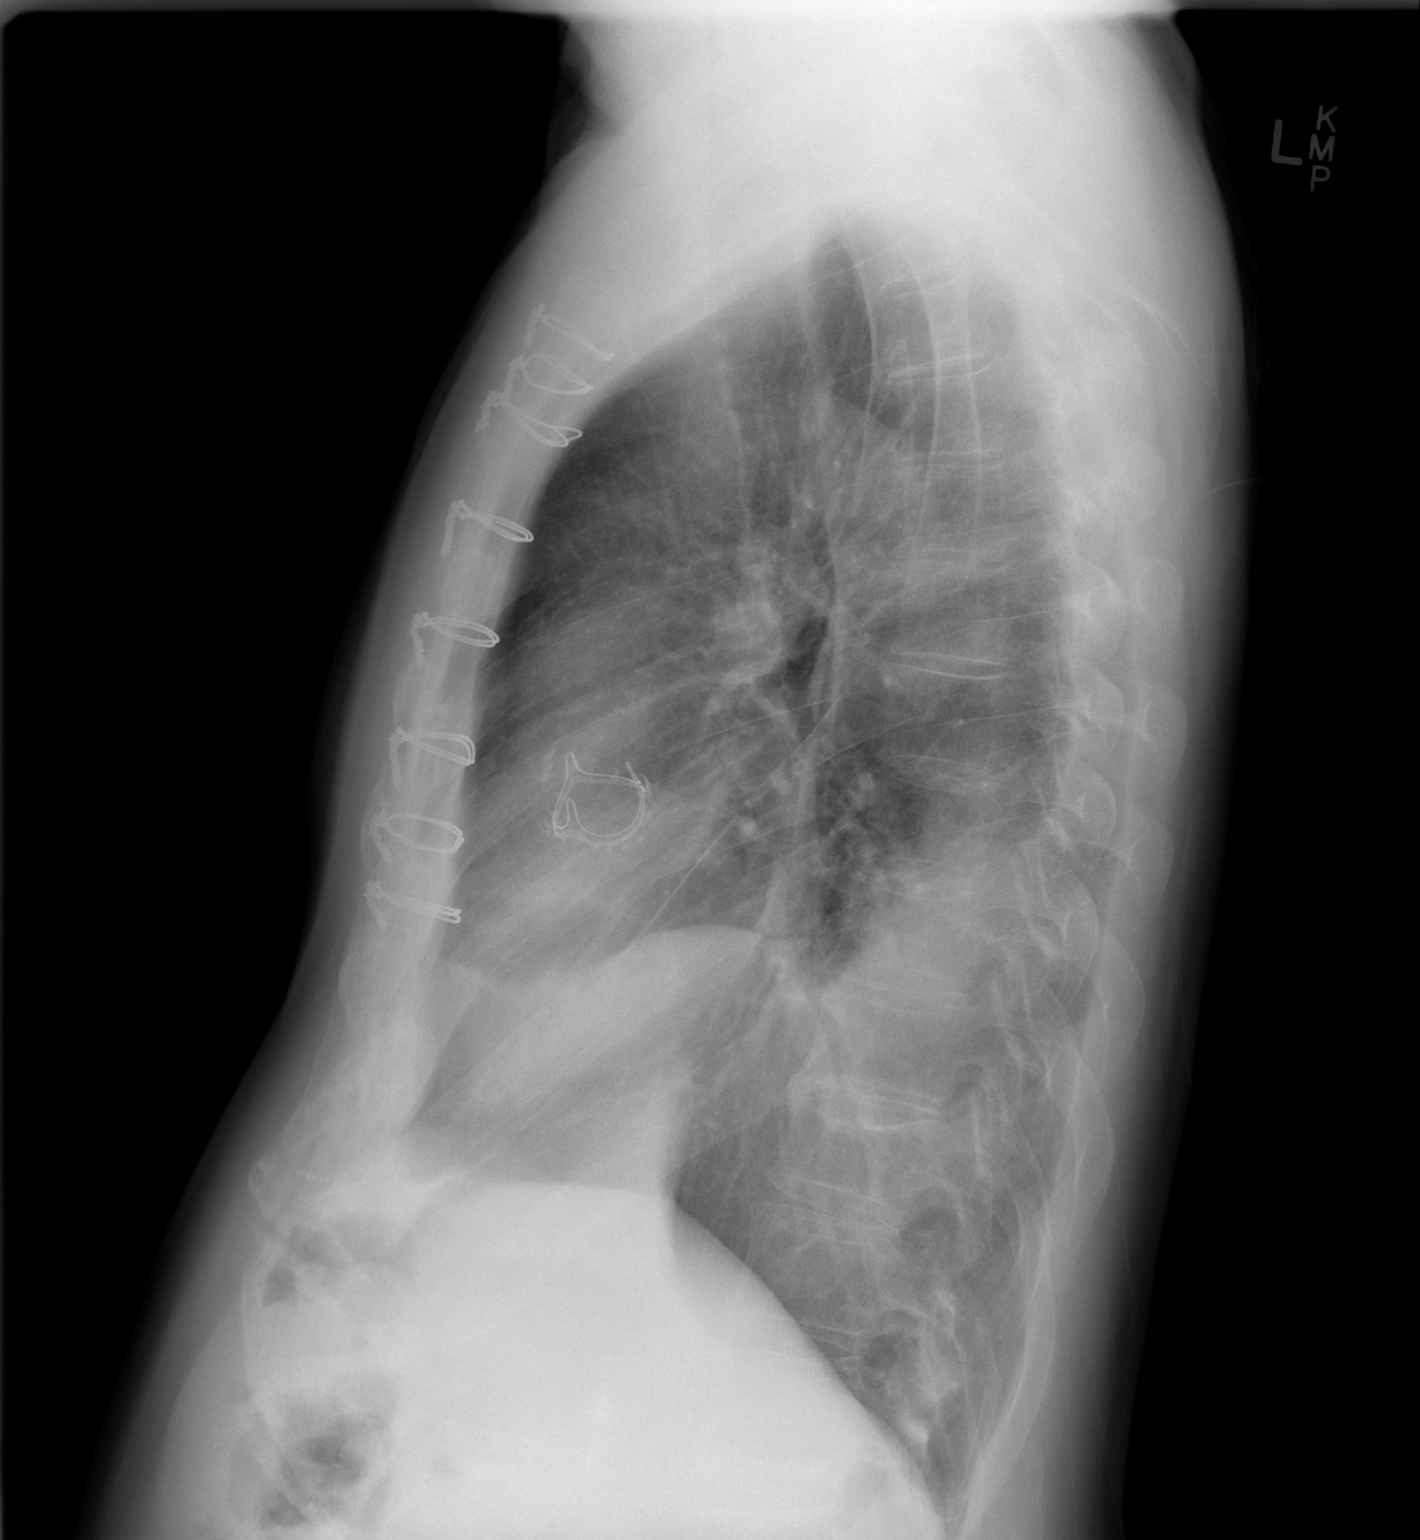

[2 of 2 positions shown; findings below may reference images not displayed]

FINDINGS: The right lung well-expanded and clear. On the left there is
persistent mild volume loss with small pleural effusion unchanged
since the previous exam. The heart is top normal in size. The
pulmonary vascularity is normal. There are 8 intact sternal wires.
The prosthetic aortic valve ring is visible.
IMPRESSION: Stable volume loss on the left consistent with pleural effusion and
basilar atelectasis. There is no pulmonary edema.

## 2017-07-20 DIAGNOSIS — I4892 Unspecified atrial flutter: Secondary | ICD-10-CM | POA: Diagnosis not present

## 2017-07-20 DIAGNOSIS — Z952 Presence of prosthetic heart valve: Secondary | ICD-10-CM | POA: Diagnosis not present

## 2017-07-20 DIAGNOSIS — E785 Hyperlipidemia, unspecified: Secondary | ICD-10-CM | POA: Diagnosis not present

## 2017-07-20 DIAGNOSIS — Z79899 Other long term (current) drug therapy: Secondary | ICD-10-CM | POA: Diagnosis not present

## 2017-07-21 LAB — COMPREHENSIVE METABOLIC PANEL
A/G RATIO: 1.7 (ref 1.2–2.2)
ALK PHOS: 68 IU/L (ref 39–117)
ALT: 8 IU/L (ref 0–44)
AST: 13 IU/L (ref 0–40)
Albumin: 4.3 g/dL (ref 3.5–4.8)
BILIRUBIN TOTAL: 0.6 mg/dL (ref 0.0–1.2)
BUN / CREAT RATIO: 16 (ref 10–24)
BUN: 18 mg/dL (ref 8–27)
CALCIUM: 8.9 mg/dL (ref 8.6–10.2)
CO2: 20 mmol/L (ref 20–29)
Chloride: 105 mmol/L (ref 96–106)
Creatinine, Ser: 1.15 mg/dL (ref 0.76–1.27)
GFR calc Af Amer: 70 mL/min/{1.73_m2} (ref >59)
GFR calc non Af Amer: 60 mL/min/{1.73_m2} (ref >59)
Globulin, Total: 2.6 g/dL (ref 1.5–4.5)
Glucose: 85 mg/dL (ref 65–99)
Potassium: 5 mmol/L (ref 3.5–5.2)
SODIUM: 142 mmol/L (ref 134–144)
Total Protein: 6.9 g/dL (ref 6.0–8.5)

## 2017-07-21 LAB — LIPID PANEL
Chol/HDL Ratio: 4.9 ratio (ref 0.0–5.0)
Cholesterol, Total: 163 mg/dL (ref 100–199)
HDL: 33 mg/dL — ABNORMAL LOW (ref >39)
LDL Calculated: 113 mg/dL — ABNORMAL HIGH (ref 0–99)
Triglycerides: 86 mg/dL (ref 0–149)
VLDL Cholesterol Cal: 17 mg/dL (ref 5–40)

## 2017-07-21 LAB — CBC
Hematocrit: 40.5 % (ref 37.5–51.0)
Hemoglobin: 14.1 g/dL (ref 13.0–17.7)
MCH: 35.4 pg — AB (ref 26.6–33.0)
MCHC: 34.8 g/dL (ref 31.5–35.7)
MCV: 102 fL — AB (ref 79–97)
Platelets: 240 10*3/uL (ref 150–379)
RBC: 3.98 x10E6/uL — ABNORMAL LOW (ref 4.14–5.80)
RDW: 13.4 % (ref 12.3–15.4)
WBC: 6 10*3/uL (ref 3.4–10.8)

## 2017-07-21 LAB — TSH: TSH: 2.42 u[IU]/mL (ref 0.450–4.500)

## 2017-07-28 ENCOUNTER — Ambulatory Visit: Payer: Medicare Other | Admitting: Cardiovascular Disease

## 2017-07-29 ENCOUNTER — Encounter: Payer: Self-pay | Admitting: Cardiovascular Disease

## 2017-07-31 ENCOUNTER — Ambulatory Visit (INDEPENDENT_AMBULATORY_CARE_PROVIDER_SITE_OTHER): Payer: Medicare Other | Admitting: Cardiovascular Disease

## 2017-07-31 ENCOUNTER — Encounter: Payer: Self-pay | Admitting: Cardiovascular Disease

## 2017-07-31 VITALS — BP 154/80 | HR 57 | Ht 73.0 in | Wt 188.6 lb

## 2017-07-31 DIAGNOSIS — R718 Other abnormality of red blood cells: Secondary | ICD-10-CM | POA: Diagnosis not present

## 2017-07-31 DIAGNOSIS — E785 Hyperlipidemia, unspecified: Secondary | ICD-10-CM | POA: Diagnosis not present

## 2017-07-31 DIAGNOSIS — Z79899 Other long term (current) drug therapy: Secondary | ICD-10-CM | POA: Diagnosis not present

## 2017-07-31 DIAGNOSIS — R001 Bradycardia, unspecified: Secondary | ICD-10-CM

## 2017-07-31 DIAGNOSIS — Z952 Presence of prosthetic heart valve: Secondary | ICD-10-CM | POA: Diagnosis not present

## 2017-07-31 MED ORDER — ROSUVASTATIN CALCIUM 10 MG PO TABS: 10.0000 mg | ORAL_TABLET | Freq: Every day | ORAL | 3 refills | Status: DC

## 2017-07-31 NOTE — Patient Instructions (Signed)
Medication Instructions:  START rosuvastatin (Crestor) 10 mg  --take every other day for 2-3 weeks, then increase to daily  Labwork: Please return for FASTING labs in 3 months (CMET, Lipid, B12, Folate)  Our in office lab hours are Monday-Friday 8:00-4:00, closed for lunch 12:45-1:45 pm.  No appointment needed.  Follow-Up: 4 months with Dr. Claiborne Billings  Any Other Special Instructions Will Be Listed Below (If Applicable).     If you need a refill on your cardiac medications before your next appointment, please call your pharmacy.

## 2017-07-31 NOTE — Progress Notes (Signed)
Patient ID: Erik [name], male   DOB: February 10, 1936, 82 y.o.   MRN: [id number]    PCP: Dr. Jani Gravel  HPI: Erik [name] is a 82 y.o. male who presents to the office today for a 7 month follow up cardiology evaluation.  I initially saw Erik [name] in cardiology consultation in April 2016 while in the hospital after he experienced a brief episode of chest discomfort associated with presyncope and transient visual blurring.  Physical examination suggested a murmur of severe aortic stenosis.  This was verified by echo Doppler data, which suggested critical AS.  I performed a right and left heart catheterization 06/22/2014.  He was found to have critical aortic valve stenosis without significant coronary obstructive disease with only a smooth 20% luminal narrowing of the proximal RCA and moderate global LV dysfunction with an EF of 35-40%.  He had upper normal right heart pressures.  He underwent aortic valve replacement surgery by Dr. Roxy Manns using a bioprosthetic tissue valve on 06/30/2014.  During his hospitalization, he was taken off all beta blockers because of bradycardia.  Ultimately, beta blocker was reinstituted.  He underwent cardioversion on 10/09/2014 after being on Coumadin anticoagulation for over a month.  He was seen on 10/25/2014 by [name], which time he was found to be back in atrial flutter.  He subsequent was referred to Dr. Rayann Heman who saw him on 11/29/2014.  He underwent successful atrial flutter ablation by Dr. Rayann Heman on 12/19/2014.  Subsequently, he was taken off essentially all medications with the exception of aspirin.  He is not aware of any recurrent atrial flutter or atrial fibrillation.    A follow-up echo Doppler study in 06/25/2015 showed an ejection fraction of 60-65% with normal wall motion and diastolic function.  His aortic valve bioprosthesis was well-seated.  There was no stenosis.  The mean gradient was a peak gradient 15 concordant with the valve.  His right atrium was mild  moderately dilated.  He has seen Dr. Roxy Manns for follow-up evaluation who felt he was stable from his surgical standpoint.  Erik [name] continues to feel well.  When I last saw him, he had an atrial bigeminal pattern.  He has had issues in the past with bradycardia. He is not on any medication except ASA 81 mg and denies any recurrent awareness of arrhythmia.  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  He denies chest pain, PND, orthopnea.  He denies presyncope or syncope.  A one-year follow-up echo Doppler study on 05/20/2016 revealed an ejection fraction at  50-55%.  His bioprosthetic aortic valve was well-seated.  There was no aortic regurgitation or perivalvular leak.  Mean gradient was 7 and peak gradient 12.  There was trivial TR and PR and his left atrium was mildly dilated.   I last saw him in November 2018 he denied any chest pain or shortness of breath, dizziness or palpitations.  He was very active fixing up his house anticipation of selling his house and ultimately moving to [place].  Asymptomatic and denies chest pain, shortness of breath, dizziness, or palpitations.  He remains very active.  He has been fixing up his house in anticipation of selling his house and ultimately moving to [facility].   Presently, he denies chest pain or shortness of breath.  He is tired.  Recently had lab work which showed his LDL cholesterol had risen to 113.  Is only been taking a baby aspirin.  TSH was normal  at 2.42.  He presents for evaluation.  Past Medical History:  Diagnosis Date  . Aortic stenosis, severe   . Atrial flutter, post-operative 07/27/2014   typical   . Chronic diastolic congestive heart failure ([place])   . S/P aortic valve replacement with bioprosthetic valve 06/30/2014   25 mm [facility] Ease bovine pericardial tissue valve  . Sinus bradycardia     Past Surgical History:  Procedure Laterality Date  . AORTIC VALVE REPLACEMENT N/A 06/30/2014    Procedure: AORTIC VALVE REPLACEMENT (AVR);  Surgeon: Rexene Alberts, MD;  Location: [place];  Service: Open Heart Surgery;  Laterality: N/A;  . APPENDECTOMY    . CARDIOVERSION N/A 10/09/2014   Procedure: CARDIOVERSION;  Surgeon: Troy Sine, MD;  Location: [place];  Service: Cardiovascular;  Laterality: N/A;  . CATARACT EXTRACTION Bilateral ? 2013    & 2014  . ELECTROPHYSIOLOGIC STUDY N/A 12/19/2014   CTI ablation by Dr Rayann Heman  . HAND SURGERY    . INTRAOPERATIVE TRANSESOPHAGEAL ECHOCARDIOGRAM N/A 06/30/2014   Procedure: INTRAOPERATIVE TRANSESOPHAGEAL ECHOCARDIOGRAM;  Surgeon: Rexene Alberts, MD;  Location: [place];  Service: Open Heart Surgery;  Laterality: N/A;  . LEFT AND RIGHT HEART CATHETERIZATION WITH CORONARY ANGIOGRAM N/A 06/22/2014   Procedure: LEFT AND RIGHT HEART CATHETERIZATION WITH CORONARY ANGIOGRAM;  Surgeon: Troy Sine, MD;  Location: [facility] CATH LAB;  Service: Cardiovascular;  Laterality: N/A;    No Known Allergies  Current Outpatient Medications  Medication Sig Dispense Refill  . aspirin EC 81 MG tablet Take 81 mg by mouth daily.    . rosuvastatin (CRESTOR) 10 MG tablet Take 1 tablet (10 mg total) by mouth daily. 90 tablet 3   No current facility-administered medications for this visit.     Social History   Socioeconomic History  . Marital status: Married    Spouse name: Not on file  . Number of children: Not on file  . Years of education: Not on file  . Highest education level: Not on file  Occupational History  . Not on file  Social Needs  . Financial resource strain: Not on file  . Food insecurity:    Worry: Not on file    Inability: Not on file  . Transportation needs:    Medical: Not on file    Non-medical: Not on file  Tobacco Use  . Smoking status: Never Smoker  . Smokeless tobacco: Never Used  . Tobacco comment: smoked in college  Substance and Sexual Activity  . Alcohol use: No    Comment: occ   . Drug use: No  . Sexual activity: Yes    Lifestyle  . Physical activity:    Days per week: Not on file    Minutes per session: Not on file  . Stress: Not on file  Relationships  . Social connections:    Talks on phone: Not on file    Gets together: Not on file    Attends religious service: Not on file    Active member of club or organization: Not on file    Attends meetings of clubs or organizations: Not on file    Relationship status: Not on file  . Intimate partner violence:    Fear of current or ex partner: Not on file    Emotionally abused: Not on file    Physically abused: Not on file    Forced sexual activity: Not on file  Other Topics Concern  . Not on file  Social History Narrative  Pt lives in [place] with spouse.  Retired Scientist, physiological.    Family History  Problem Relation Age of Onset  . Stroke Mother   . Hypertension Mother   . Cancer Father     ROS General: Negative; No fevers, chills, or night sweats HEENT: Negative; No changes in vision or hearing, sinus congestion, difficulty swallowing Pulmonary: Negative; No cough, wheezing, shortness of breath, hemoptysis Cardiovascular: See HPI:  GI: Negative; No nausea, vomiting, diarrhea, or abdominal pain GU: Negative; No dysuria, hematuria, or difficulty voiding Musculoskeletal: Negative; no myalgias, joint pain, or weakness Hematologic: Negative; no easy bruising, bleeding Endocrine: Negative; no heat/cold intolerance; no diabetes, Neuro: Negative; no changes in balance, headaches Skin: Negative; No rashes or skin lesions Psychiatric: Negative; No behavioral problems, depression Sleep: Negative; No snoring,  daytime sleepiness, hypersomnolence, bruxism, restless legs, hypnogognic hallucinations. Other comprehensive 14 point system review is negative   Physical Exam BP (!) 154/80 (BP Location: Left Arm)   Pulse (!) 57   Ht '6\' 1"'$  (1.854 m)   Wt 188 lb 9.6 oz (85.5 kg)   BMI 24.88 kg/m    Repeat blood pressure by me was 126/80  Wt  Readings from Last 3 Encounters:  07/31/17 188 lb 9.6 oz (85.5 kg)  01/29/17 182 lb (82.6 kg)  06/11/16 181 lb 12.8 oz (82.5 kg)   General: Alert, oriented, no distress.  Skin: normal turgor, no rashes, warm and dry HEENT: Normocephalic, atraumatic. Pupils equal round and reactive to light; sclera anicteric; extraocular muscles intact;  Nose without nasal septal hypertrophy Mouth/Parynx benign; Mallinpatti scale 2 Neck: No JVD, no carotid bruits; normal carotid upstroke Lungs: clear to ausculatation and percussion; no wheezing or rales Chest wall: without tenderness to palpitation Heart: PMI not displaced, RRR, s1 s2 normal, 1/6 systolic murmur, no diastolic murmur, no rubs, gallops, thrills, or heaves Abdomen: soft, nontender; no hepatosplenomehaly, BS+; abdominal aorta nontender and not dilated by palpation. Back: no CVA tenderness Pulses 2+ Musculoskeletal: full range of motion, normal strength, no joint deformities Extremities: no clubbing cyanosis or edema, Homan's sign negative  Neurologic: grossly nonfocal; Cranial nerves grossly wnl Psychologic: Normal mood and affect   ECG (independently read by me): Sinus bradycardia at 57 bpm.  First-degree AV block with a PR interval at 232 ms.  PAC.  QS complex.  November 2018 ECG (independently read by me): Sinus bradycardia 54 bpm with sinus arrhythmia.  First degree AV block with a PR interval at 214 ms.  PAC.  Poor anterior R-wave progression V1 through V4.  March 2018 ECG (independently read by me): Sinus bradycardia with first degree AV block.  PAC is in an atrial bigeminal rhythm.    September 2017 ECG (independently read by me): Sinus bradycardia 53 bpm.  First-degree AV block.  There are PACs and atrial bigeminal pattern.  He has poor anterior R-wave voltage.  January 2017 ECG (independently read by me): Sinus bradycardia 50 bpm with first-degree AV block with a PR interval at 234 ms and evidence for occasional PAC.  September  2016 ECG (independently read by me): Atrial flutter with 4:1 block at 64 bpm.  09/11/2014 ECG (independently read by me): Atrial flutter with 41 block.  One isolated PVC.  Ventricular rate 67 bpm.  Nonspecific ST-T change  Prior ECG (independently read by me): Underlying atrial flutter with a ventricular rate at 88 with frequent PVCs in a bigeminal pattern transiently.  LABS:  BMP Latest Ref Rng & Units 07/20/2017 06/11/2016 06/22/2015  Glucose 65 -  99 mg/dL 85 96 85  BUN 8 - 27 mg/dL '18 16 13  '$ Creatinine 0.76 - 1.27 mg/dL 1.15 1.20(H) 1.01  BUN/Creat Ratio 10 - 24 16 - -  Sodium 134 - 144 mmol/L 142 141 138  Potassium 3.5 - 5.2 mmol/L 5.0 5.0 4.7  Chloride 96 - 106 mmol/L 105 106 104  CO2 20 - 29 mmol/L '20 24 25  '$ Calcium 8.6 - 10.2 mg/dL 8.9 9.7 9.1    Hepatic Function Latest Ref Rng & Units 07/20/2017 06/11/2016 06/22/2015  Total Protein 6.0 - 8.5 g/dL 6.9 7.6 7.0  Albumin 3.5 - 4.8 g/dL 4.3 4.1 4.0  AST 0 - 40 IU/L '13 13 14  '$ ALT 0 - 44 IU/L '8 9 9  '$ Alk Phosphatase 39 - 117 IU/L 68 66 67  Total Bilirubin 0.0 - 1.2 mg/dL 0.6 0.9 1.0    CBC Latest Ref Rng & Units 07/20/2017 06/11/2016 06/22/2015  WBC 3.4 - 10.8 x10E3/uL 6.0 5.8 4.6  Hemoglobin 13.0 - 17.7 g/dL 14.1 14.7 13.4  Hematocrit 37.5 - 51.0 % 40.5 43.4 39.7  Platelets 150 - 379 x10E3/uL 240 231 217   Lab Results  Component Value Date   MCV 102 (H) 07/20/2017   MCV 100.0 06/11/2016   MCV 98.3 06/22/2015    Lab Results  Component Value Date   TSH 2.420 07/20/2017    BNP    Component Value Date/Time   BNP 1,282.9 (H) 06/19/2014 1630    ProBNP No results found for: PROBNP   Lipid Panel     Component Value Date/Time   CHOL 163 07/20/2017 0821   TRIG 86 07/20/2017 0821   HDL 33 (L) 07/20/2017 0821   CHOLHDL 4.9 07/20/2017 0821   CHOLHDL 4.8 06/11/2016 1120   VLDL 19 06/11/2016 1120   LDLCALC 113 (H) 07/20/2017 0821     RADIOLOGY: No results found.  IMPRESSION:  1. Elevated MCV   2. Hyperlipidemia,  unspecified hyperlipidemia type   3. S/P AVR (aortic valve replacement) for severe AS   4. Medication management   5. Sinus bradycardia     ASSESSMENT AND PLAN: Erik [name] is a young appearing 82 year-old gentleman who was found to have severe symptomatic critical aortic valve stenosis after he presented to the hospital following a presyncopal spell in 2016.  He successfully underwent aortic valve replacement surgery on 06/30/2014 with a 25 mm [facility] Ease bovine pericardial tissue valve.  He developed atrial flutter in October 2016 and underwent successful ablation.  He was taken off warfarin therapy as well as beta blocker therapy.  He continues to be on aspirin alone.  He, he had an atrial bigeminal rhythm and some bradycardia.  He has not been on beta-blocker therapy.  Major complaint today is that of fatigue.  He is sleeping well.  He is unaware of snoring.  He denies chest pain or dyspnea.  Blood pressure is stable.  His LDL cholesterol is now 113.  I have suggested a trial of statin therapy and will start him on rosuvastatin 10 mg every other day for several weeks and if he tolerates this he will increase this to 10 mg daily.  Laboratory also shows macrocytosis with an MCV of 10 which has risen from 2 years ago when it was 93.  He is not anemic.  I will check a B12 and folate level.  Renal function is stable.  I reviewed his last echo Doppler study from March 2018 which showed a well-seated bioprosthetic  aortic valve with a mean gradient of 7 and peak gradient of 12 consistent with his valve replacement.  There was no evidence of for aortic regurgitation or perivalvular leak.  I will repeat laboratory in 3 months.  I will see him in 4 months for reevaluation.  Time spent: 25 minutes  Troy Sine, MD, [facility]  08/02/2017 2:13 PM

## 2017-08-02 ENCOUNTER — Encounter: Payer: Self-pay | Admitting: Cardiovascular Disease

## 2017-08-03 ENCOUNTER — Other Ambulatory Visit (INDEPENDENT_AMBULATORY_CARE_PROVIDER_SITE_OTHER): Payer: Medicare Other

## 2017-08-03 DIAGNOSIS — I5032 Chronic diastolic (congestive) heart failure: Secondary | ICD-10-CM | POA: Diagnosis not present

## 2017-08-03 DIAGNOSIS — R079 Chest pain, unspecified: Secondary | ICD-10-CM

## 2017-09-23 ENCOUNTER — Telehealth: Payer: Self-pay | Admitting: Cardiovascular Disease

## 2017-09-23 NOTE — Telephone Encounter (Signed)
Agree with holding meds. May need Repatha. Forwarded to [facility] for review.

## 2017-09-23 NOTE — Telephone Encounter (Signed)
Returned call-spoke to patient.  He states he started rosuvastatin about a month ago and started having fatigue, muscle and joint aches.  He stopped the medication as he was moving and couldn't function the way he needed to.  He recently started the medication again to reattempt and he is having severe leg and joint pain, he was up all night with the aches.  He states he has been taking Coq10 with the rosuvastatin as well.   He has been taking rosuvastatin 10 QOD.  Advised to hold medication as of now and will review with Dr. Claiborne Billings next week for recommendations.   Patient agreed and verbalized understanding.

## 2017-09-23 NOTE — Telephone Encounter (Signed)
New message    Pt c/o medication issue:  1. Name of Medication: rosuvastatin (CRESTOR) 10 MG tablet  2. How are you currently taking this medication (dosage and times per day)? Take 1 tablet (10 mg total) by mouth daily.  3. Are you having a reaction (difficulty breathing--STAT)? Muscle pain  4. What is your medication issue? Fatigue and muscle pain

## 2017-10-02 NOTE — Telephone Encounter (Signed)
Please schedule for pharmacist lipid clinic to discuss cholesterol therapy options.

## 2017-10-05 NOTE — Telephone Encounter (Signed)
Message sent to scheduling 

## 2017-10-06 ENCOUNTER — Telehealth: Payer: Self-pay | Admitting: Cardiovascular Disease

## 2017-10-06 NOTE — Telephone Encounter (Signed)
Called patient and LVM to call and schedule his lipid clinic appointment with the pharmacist.

## 2017-11-11 ENCOUNTER — Telehealth: Payer: Self-pay | Admitting: Cardiovascular Disease

## 2017-11-11 NOTE — Telephone Encounter (Signed)
Called patient and LVM for the patient to call back and schedule a lipid clinic appointment.

## 2017-11-17 ENCOUNTER — Telehealth: Payer: Self-pay | Admitting: Cardiovascular Disease

## 2017-11-17 NOTE — Telephone Encounter (Signed)
I called the patient a second time and asked that he call back to schedule his appt.

## 2017-12-01 ENCOUNTER — Ambulatory Visit: Payer: Medicare Other | Admitting: Cardiovascular Disease

## 2018-02-08 DIAGNOSIS — Z79899 Other long term (current) drug therapy: Secondary | ICD-10-CM | POA: Diagnosis not present

## 2018-02-08 DIAGNOSIS — E785 Hyperlipidemia, unspecified: Secondary | ICD-10-CM | POA: Diagnosis not present

## 2018-02-08 DIAGNOSIS — R718 Other abnormality of red blood cells: Secondary | ICD-10-CM | POA: Diagnosis not present

## 2018-02-08 LAB — LIPID PANEL
CHOL/HDL RATIO: 3.7 ratio (ref 0.0–5.0)
Cholesterol, Total: 127 mg/dL (ref 100–199)
HDL: 34 mg/dL — ABNORMAL LOW (ref >39)
LDL CALC: 77 mg/dL (ref 0–99)
Triglycerides: 80 mg/dL (ref 0–149)
VLDL CHOLESTEROL CAL: 16 mg/dL (ref 5–40)

## 2018-02-08 LAB — COMPREHENSIVE METABOLIC PANEL
ALBUMIN: 4.4 g/dL (ref 3.5–4.7)
ALT: 7 IU/L (ref 0–44)
AST: 11 IU/L (ref 0–40)
Albumin/Globulin Ratio: 1.6 (ref 1.2–2.2)
Alkaline Phosphatase: 84 IU/L (ref 39–117)
BUN / CREAT RATIO: 10 (ref 10–24)
BUN: 12 mg/dL (ref 8–27)
Bilirubin Total: 0.8 mg/dL (ref 0.0–1.2)
CALCIUM: 9.6 mg/dL (ref 8.6–10.2)
CO2: 24 mmol/L (ref 20–29)
Chloride: 103 mmol/L (ref 96–106)
Creatinine, Ser: 1.18 mg/dL (ref 0.76–1.27)
GFR, EST AFRICAN AMERICAN: 67 mL/min/{1.73_m2} (ref >59)
GFR, EST NON AFRICAN AMERICAN: 58 mL/min/{1.73_m2} — AB (ref >59)
GLUCOSE: 99 mg/dL (ref 65–99)
Globulin, Total: 2.8 g/dL (ref 1.5–4.5)
Potassium: 5 mmol/L (ref 3.5–5.2)
Sodium: 142 mmol/L (ref 134–144)
TOTAL PROTEIN: 7.2 g/dL (ref 6.0–8.5)

## 2018-02-08 LAB — B12 AND FOLATE PANEL
Folate: 10.6 ng/mL (ref >3.0)
VITAMIN B 12: 247 pg/mL (ref 232–1245)

## 2018-02-16 ENCOUNTER — Encounter: Payer: Self-pay | Admitting: Cardiovascular Disease

## 2018-02-16 ENCOUNTER — Ambulatory Visit (INDEPENDENT_AMBULATORY_CARE_PROVIDER_SITE_OTHER): Payer: Medicare Other | Admitting: Cardiovascular Disease

## 2018-02-16 VITALS — BP 138/75 | HR 54 | Ht 73.0 in | Wt 180.6 lb

## 2018-02-16 DIAGNOSIS — I350 Nonrheumatic aortic (valve) stenosis: Secondary | ICD-10-CM | POA: Diagnosis not present

## 2018-02-16 DIAGNOSIS — Z952 Presence of prosthetic heart valve: Secondary | ICD-10-CM

## 2018-02-16 DIAGNOSIS — R001 Bradycardia, unspecified: Secondary | ICD-10-CM

## 2018-02-16 DIAGNOSIS — R718 Other abnormality of red blood cells: Secondary | ICD-10-CM

## 2018-02-16 DIAGNOSIS — E785 Hyperlipidemia, unspecified: Secondary | ICD-10-CM

## 2018-02-16 NOTE — Progress Notes (Signed)
Patient ID: Erik [name], male   DOB: 09-14-1935, 82 y.o.   MRN: [id number]    PCP: Dr. Jani Gravel  HPI: Erik [name] is a 82 y.o. male who presents to the office today for a 7 month follow up cardiology evaluation.  I initially saw Erik [name] in cardiology consultation in April 2016 while in the hospital after he experienced a brief episode of chest discomfort associated with presyncope and transient visual blurring.  Physical examination suggested a murmur of severe aortic stenosis.  This was verified by echo Doppler data, which suggested critical AS.  I performed a right and left heart catheterization 06/22/2014.  He was found to have critical aortic valve stenosis without significant coronary obstructive disease with only a smooth 20% luminal narrowing of the proximal RCA and moderate global LV dysfunction with an EF of 35-40%.  He had upper normal right heart pressures.  He underwent aortic valve replacement surgery by Dr. Roxy Manns using a bioprosthetic tissue valve on 06/30/2014.  During his hospitalization, he was taken off all beta blockers because of bradycardia.  Ultimately, beta blocker was reinstituted.  He underwent cardioversion on 10/09/2014 after being on Coumadin anticoagulation for over a month.  He was seen on 10/25/2014 by [name], which time he was found to be back in atrial flutter.  He subsequent was referred to Dr. Rayann Heman who saw him on 11/29/2014.  He underwent successful atrial flutter ablation by Dr. Rayann Heman on 12/19/2014.  Subsequently, he was taken off essentially all medications with the exception of aspirin.  He is not aware of any recurrent atrial flutter or atrial fibrillation.    A follow-up echo Doppler study in 06/25/2015 showed an ejection fraction of 60-65% with normal wall motion and diastolic function.  His aortic valve bioprosthesis was well-seated.  There was no stenosis.  The mean gradient was a peak gradient 15 concordant with the valve.  His right atrium was mild  moderately dilated.  He has seen Dr. Roxy Manns for follow-up evaluation who felt he was stable from his surgical standpoint.  Erik [name] continues to feel well.  When I last saw him, he had an atrial bigeminal pattern.  He has had issues in the past with bradycardia. He is not on any medication except ASA 81 mg and denies any recurrent awareness of arrhythmia.  He continues to work in his son's [employer] and remains very active.  He denies chest pain, PND, orthopnea.  He denies presyncope or syncope.  A one-year follow-up echo Doppler study on 05/20/2016 revealed an ejection fraction at  50-55%.  His bioprosthetic aortic valve was well-seated.  There was no aortic regurgitation or perivalvular leak.  Mean gradient was 7 and peak gradient 12.  There was trivial TR and PR and his left atrium was mildly dilated.   When I saw him in November 2018 he denied any chest pain or shortness of breath, dizziness or palpitations.  He was very active fixing up his house anticipation of selling his house and ultimately moving to [place].  Asymptomatic and denies chest pain, shortness of breath, dizziness, or palpitations.  He remains very active.  He has been fixing up his house in anticipation of selling his house and ultimately moving to [facility]. Lab work which showed his LDL cholesterol had risen to 113.  Is only been taking a baby aspirin.  TSH was normal at 2.42.   I last saw him in May 2019 at which time he remained stable.  He was found to have macrocytosis on his CBC with an MCV of 102.  Subsequent B12 and folate levels were normal.  Since I saw him, he has moved to [place] instead of his previous plan to move to [facility].  He is enjoyed being in the open.  He remains very active on his land.  He denies chest pain PND orthopnea, presyncope or syncope or palpitations.  He presents for reevaluation.  Past Medical History:  Diagnosis Date  . Aortic stenosis, severe   .  Atrial flutter, post-operative 07/27/2014   typical   . Chronic diastolic congestive heart failure ([place])   . S/P aortic valve replacement with bioprosthetic valve 06/30/2014   25 mm [facility] Ease bovine pericardial tissue valve  . Sinus bradycardia     Past Surgical History:  Procedure Laterality Date  . AORTIC VALVE REPLACEMENT N/A 06/30/2014   Procedure: AORTIC VALVE REPLACEMENT (AVR);  Surgeon: Rexene Alberts, MD;  Location: [facility];  Service: Open Heart Surgery;  Laterality: N/A;  . APPENDECTOMY    . CARDIOVERSION N/A 10/09/2014   Procedure: CARDIOVERSION;  Surgeon: Troy Sine, MD;  Location: [facility];  Service: Cardiovascular;  Laterality: N/A;  . CATARACT EXTRACTION Bilateral ? 2013    & 2014  . ELECTROPHYSIOLOGIC STUDY N/A 12/19/2014   CTI ablation by Dr Rayann Heman  . HAND SURGERY    . INTRAOPERATIVE TRANSESOPHAGEAL ECHOCARDIOGRAM N/A 06/30/2014   Procedure: INTRAOPERATIVE TRANSESOPHAGEAL ECHOCARDIOGRAM;  Surgeon: Rexene Alberts, MD;  Location: [facility];  Service: Open Heart Surgery;  Laterality: N/A;  . LEFT AND RIGHT HEART CATHETERIZATION WITH CORONARY ANGIOGRAM N/A 06/22/2014   Procedure: LEFT AND RIGHT HEART CATHETERIZATION WITH CORONARY ANGIOGRAM;  Surgeon: Troy Sine, MD;  Location: [facility];  Service: Cardiovascular;  Laterality: N/A;    No Known Allergies  Current Outpatient Medications  Medication Sig Dispense Refill  . aspirin EC 81 MG tablet Take 81 mg by mouth daily.    . rosuvastatin (CRESTOR) 10 MG tablet Take 1 tablet (10 mg total) by mouth daily. 90 tablet 3   No current facility-administered medications for this visit.     Social History   Socioeconomic History  . Marital status: Married    Spouse name: Not on file  . Number of children: Not on file  . Years of education: Not on file  . Highest education level: Not on file  Occupational History  . Not on file  Social Needs  . Financial resource strain: Not on file  . Food insecurity:     Worry: Not on file    Inability: Not on file  . Transportation needs:    Medical: Not on file    Non-medical: Not on file  Tobacco Use  . Smoking status: Never Smoker  . Smokeless tobacco: Never Used  . Tobacco comment: smoked in college  Substance and Sexual Activity  . Alcohol use: No    Comment: occ   . Drug use: No  . Sexual activity: Yes  Lifestyle  . Physical activity:    Days per week: Not on file    Minutes per session: Not on file  . Stress: Not on file  Relationships  . Social connections:    Talks on phone: Not on file    Gets together: Not on file    Attends religious service: Not on file    Active member of club or organization: Not on file    Attends meetings  of clubs or organizations: Not on file    Relationship status: Not on file  . Intimate partner violence:    Fear of current or ex partner: Not on file    Emotionally abused: Not on file    Physically abused: Not on file    Forced sexual activity: Not on file  Other Topics Concern  . Not on file  Social History Narrative   Pt lives in [place] with spouse.  Retired Scientist, physiological.    Family History  Problem Relation Age of Onset  . Stroke Mother   . Hypertension Mother   . Cancer Father     ROS General: Negative; No fevers, chills, or night sweats HEENT: Negative; No changes in vision or hearing, sinus congestion, difficulty swallowing Pulmonary: Negative; No cough, wheezing, shortness of breath, hemoptysis Cardiovascular: See HPI:  GI: Negative; No nausea, vomiting, diarrhea, or abdominal pain GU: Negative; No dysuria, hematuria, or difficulty voiding Musculoskeletal: Negative; no myalgias, joint pain, or weakness Hematologic: Negative; no easy bruising, bleeding Endocrine: Negative; no heat/cold intolerance; no diabetes, Neuro: Negative; no changes in balance, headaches Skin: Negative; No rashes or skin lesions Psychiatric: Negative; No behavioral problems, depression Sleep:  Negative; No snoring,  daytime sleepiness, hypersomnolence, bruxism, restless legs, hypnogognic hallucinations. Other comprehensive 14 point system review is negative   Physical Exam BP 138/75   Pulse (!) 54   Ht '6\' 1"'$  (1.854 m)   Wt 180 lb 9.6 oz (81.9 kg)   BMI 23.83 kg/m    Repeat blood pressure by me was 122/74  Wt Readings from Last 3 Encounters:  02/16/18 180 lb 9.6 oz (81.9 kg)  07/31/17 188 lb 9.6 oz (85.5 kg)  01/29/17 182 lb (82.6 kg)   General: Alert, oriented, no distress.  Appears younger than stated age. Skin: normal turgor, no rashes, warm and dry HEENT: Normocephalic, atraumatic. Pupils equal round and reactive to light; sclera anicteric; extraocular muscles intact; Fundi ** Nose without nasal septal hypertrophy Mouth/Parynx benign; Mallinpatti scale 2 Neck: No JVD, no carotid bruits; normal carotid upstroke Lungs: clear to ausculatation and percussion; no wheezing or rales Chest wall: without tenderness to palpitation Heart: PMI not displaced, RRR, s1 s2 normal, 1-6/1 systolic murmur in the aortic area, no diastolic murmur, no rubs, gallops, thrills, or heaves Abdomen: soft, nontender; no hepatosplenomehaly, BS+; abdominal aorta nontender and not dilated by palpation. Back: no CVA tenderness Pulses 2+ Musculoskeletal: full range of motion, normal strength, no joint deformities Extremities: no clubbing cyanosis or edema, Homan's sign negative  Neurologic: grossly nonfocal; Cranial nerves grossly wnl Psychologic: Normal mood and affect   ECG (independently read by me): Sinus bradycardia 54 bpm.  First-degree AV block with a PR interval at 234 ms.  Poor anterior R wave progression  May 2019 ECG (independently read by me): Sinus bradycardia at 57 bpm.  First-degree AV block with a PR interval at 232 ms.  PAC.  QS complex.  November 2018 ECG (independently read by me): Sinus bradycardia 54 bpm with sinus arrhythmia.  First degree AV block with a PR interval at 214  ms.  PAC.  Poor anterior R-wave progression V1 through V4.  March 2018 ECG (independently read by me): Sinus bradycardia with first degree AV block.  PAC is in an atrial bigeminal rhythm.    September 2017 ECG (independently read by me): Sinus bradycardia 53 bpm.  First-degree AV block.  There are PACs and atrial bigeminal pattern.  He has poor anterior R-wave voltage.  January 2017  ECG (independently read by me): Sinus bradycardia 50 bpm with first-degree AV block with a PR interval at 234 ms and evidence for occasional PAC.  September 2016 ECG (independently read by me): Atrial flutter with 4:1 block at 64 bpm.  09/11/2014 ECG (independently read by me): Atrial flutter with 41 block.  One isolated PVC.  Ventricular rate 67 bpm.  Nonspecific ST-T change  Prior ECG (independently read by me): Underlying atrial flutter with a ventricular rate at 88 with frequent PVCs in a bigeminal pattern transiently.  LABS:  BMP Latest Ref Rng & Units 02/08/2018 07/20/2017 06/11/2016  Glucose 65 - 99 mg/dL 99 85 96  BUN 8 - 27 mg/dL '12 18 16  '$ Creatinine 0.76 - 1.27 mg/dL 1.18 1.15 1.20(H)  BUN/Creat Ratio 10 - '24 10 16 '$ -  Sodium 134 - 144 mmol/L 142 142 141  Potassium 3.5 - 5.2 mmol/L 5.0 5.0 5.0  Chloride 96 - 106 mmol/L 103 105 106  CO2 20 - 29 mmol/L '24 20 24  '$ Calcium 8.6 - 10.2 mg/dL 9.6 8.9 9.7    Hepatic Function Latest Ref Rng & Units 02/08/2018 07/20/2017 06/11/2016  Total Protein 6.0 - 8.5 g/dL 7.2 6.9 7.6  Albumin 3.5 - 4.7 g/dL 4.4 4.3 4.1  AST 0 - 40 IU/L '11 13 13  '$ ALT 0 - 44 IU/L '7 8 9  '$ Alk Phosphatase 39 - 117 IU/L 84 68 66  Total Bilirubin 0.0 - 1.2 mg/dL 0.8 0.6 0.9    CBC Latest Ref Rng & Units 07/20/2017 06/11/2016 06/22/2015  WBC 3.4 - 10.8 x10E3/uL 6.0 5.8 4.6  Hemoglobin 13.0 - 17.7 g/dL 14.1 14.7 13.4  Hematocrit 37.5 - 51.0 % 40.5 43.4 39.7  Platelets 150 - 379 x10E3/uL 240 231 217   Lab Results  Component Value Date   MCV 102 (H) 07/20/2017   MCV 100.0 06/11/2016   MCV  98.3 06/22/2015    Lab Results  Component Value Date   TSH 2.420 07/20/2017    BNP    Component Value Date/Time   BNP 1,282.9 (H) 06/19/2014 1630    ProBNP No results found for: PROBNP   Lipid Panel     Component Value Date/Time   CHOL 127 02/08/2018 0859   TRIG 80 02/08/2018 0859   HDL 34 (L) 02/08/2018 0859   CHOLHDL 3.7 02/08/2018 0859   CHOLHDL 4.8 06/11/2016 1120   VLDL 19 06/11/2016 1120   LDLCALC 77 02/08/2018 0859     RADIOLOGY: No results found.  IMPRESSION:  1. S/P AVR (aortic valve replacement) for severe AS   2. Aortic stenosis, severe   3. Hyperlipidemia with target LDL less than 70   4. Elevated MCV   5. Sinus bradycardia     ASSESSMENT AND PLAN: Erik [name] is a young appearing 82 year-old gentleman who was found to have severe symptomatic critical aortic valve stenosis after he presented to the hospital following a presyncopal spell in 2016.  He successfully underwent aortic valve replacement surgery on 06/30/2014 with a 25 mm [facility] Ease bovine pericardial tissue valve.  He developed atrial flutter in October 2016 and underwent successful ablation.  He was taken off warfarin therapy as well as beta blocker therapy.  He continues to be on aspirin alone.  He had an atrial bigeminal rhythm and some bradycardia.  He has not been on beta-blocker therapy.  His blood pressure today is stable and repeat BP by me was 122/74.  He is not on any blood pressure  lowering medication.  When I last saw him, his LDL cholesterol was elevated at 113, and I recommended institution of rosuvastatin 10 mg at least every other day and if tolerated to increase to 10 mg daily.  Apparently he has been taking rosuvastatin but is only been taking 5 mg 3 times per week.  He has been without any chest pain.  He remains very active and denies any presyncope or syncope.  His ECG today shows sinus bradycardia which he is well tolerating.  His last echo Doppler study was in March  2018 which showed an EF of 50 to 55%.  His bioprosthetic aortic valve was well-seated.  There was no aortic insufficiency.  His mean gradient was 7 with a peak gradient of 12.  I have recommended that he have a follow-up echo Doppler study in 6 months and I will see him in follow-up of that study for further evaluation.  Time spent: 25 minutes Troy Sine, MD, [facility]  02/18/2018 8:27 PM

## 2018-02-16 NOTE — Patient Instructions (Signed)
Medication Instructions:  Your physician recommends that you continue on your current medications as directed. Please refer to the Current Medication list given to you today.  If you need a refill on your cardiac medications before your next appointment, please call your pharmacy.   Testing/Procedures: Your physician has requested that you have an echocardiogram in 6 MONTHS (same morning as appt). Echocardiography is a painless test that uses sound waves to create images of your heart. It provides your doctor with information about the size and shape of your heart and how well your heart's chambers and valves are working. This procedure takes approximately one hour. There are no restrictions for this procedure.  Follow-Up: At [facility], you and your health needs are our priority.  As part of our continuing mission to provide you with exceptional heart care, we have created designated Provider Care Teams.  These Care Teams include your primary Cardiologist (physician) and Advanced Practice Providers (APPs -  Physician Assistants and Nurse Practitioners) who all work together to provide you with the care you need, when you need it. You will need a follow up appointment in 6 months.  Please call our office 2 months in advance to schedule this appointment.  You may see Dr. Claiborne Billings or one of the following Advanced Practice Providers on your designated Care Team: [place], [place] . Fabian Sharp, PA-C

## 2018-02-18 ENCOUNTER — Encounter: Payer: Self-pay | Admitting: Cardiovascular Disease

## 2018-02-25 NOTE — Addendum Note (Signed)
Addended by: Leland Johns A on: 02/25/2018 07:27 AM   Modules accepted: Orders

## 2018-08-23 ENCOUNTER — Telehealth (HOSPITAL_COMMUNITY): Payer: Self-pay | Admitting: *Deleted

## 2018-08-23 NOTE — Telephone Encounter (Signed)
COVID-19 Pre-Screening Questions:  . Do you currently have a fever? No (yes = cancel and refer to pcp for e-visit) . Have you recently travelled on a cruise, internationally, or to [place], [place], [place], [place], [place], or [place], [place] ([place]) ? No (yes = cancel, stay home, monitor symptoms, and contact pcp or initiate e-visit if symptoms develop) . Have you been in contact with someone that is currently pending confirmation of Covid19 testing or has been confirmed to have the Covid19 virus?  No (yes = cancel, stay home, away from tested individual, monitor symptoms, and contact pcp or initiate e-visit if symptoms develop) . Are you currently experiencing fatigue or cough? No (yes = pt should be prepared to have a mask placed at the time of their visit).   . Reiterated no additional visitors. . Arrive no earlier than 15 minutes before appointment time. . Please bring own mask.  Erik [name] 

## 2018-08-24 ENCOUNTER — Other Ambulatory Visit: Payer: Self-pay

## 2018-08-24 ENCOUNTER — Ambulatory Visit (HOSPITAL_COMMUNITY): Payer: Medicare Other | Attending: Cardiovascular Disease

## 2018-08-24 DIAGNOSIS — Z952 Presence of prosthetic heart valve: Secondary | ICD-10-CM | POA: Insufficient documentation

## 2018-08-24 DIAGNOSIS — I350 Nonrheumatic aortic (valve) stenosis: Secondary | ICD-10-CM | POA: Insufficient documentation

## 2018-08-27 ENCOUNTER — Telehealth: Payer: Self-pay | Admitting: Cardiovascular Disease

## 2018-08-27 NOTE — Telephone Encounter (Signed)
Mychart, smartphone, consent , pre reg complete 08/27/18 AF

## 2018-09-02 ENCOUNTER — Other Ambulatory Visit: Payer: Self-pay

## 2018-09-02 ENCOUNTER — Telehealth (INDEPENDENT_AMBULATORY_CARE_PROVIDER_SITE_OTHER): Payer: Medicare Other | Admitting: Cardiovascular Disease

## 2018-09-02 VITALS — BP 130/67 | HR 59 | Ht 73.0 in | Wt 177.0 lb

## 2018-09-02 DIAGNOSIS — E785 Hyperlipidemia, unspecified: Secondary | ICD-10-CM | POA: Diagnosis not present

## 2018-09-02 DIAGNOSIS — I350 Nonrheumatic aortic (valve) stenosis: Secondary | ICD-10-CM | POA: Diagnosis not present

## 2018-09-02 DIAGNOSIS — Z952 Presence of prosthetic heart valve: Secondary | ICD-10-CM

## 2018-09-02 DIAGNOSIS — I4892 Unspecified atrial flutter: Secondary | ICD-10-CM

## 2018-09-02 MED ORDER — ROSUVASTATIN CALCIUM 10 MG PO TABS: 10.0000 mg | ORAL_TABLET | Freq: Every day | ORAL | 3 refills | Status: DC

## 2018-09-02 NOTE — Progress Notes (Signed)
Virtual Visit via Telephone Note   This visit type was conducted due to national recommendations for restrictions regarding the COVID-19 Pandemic (e.g. social distancing) in an effort to limit this patient's exposure and mitigate transmission in our community.  Due to his co-morbid illnesses, this patient is at least at moderate risk for complications without adequate follow up.  This format is felt to be most appropriate for this patient at this time.  The patient did not have access to video technology/had technical difficulties with video requiring transitioning to audio format only (telephone).  All issues noted in this document were discussed and addressed.  No physical exam could be performed with this format.  Please refer to the patient's chart for his  consent to telehealth for [facility].   Date:  09/02/2018   ID:  Erik [name], DOB 09-27-1935, MRN [id number]  Patient Location: Home Provider Location: Home  PCP:  Erik Gravel, MD  Cardiologist:  Erik Majestic, MD Electrophysiologist:  None   Evaluation Performed:  Follow-Up Visit  Chief Complaint:  7 month F/U AVR  History of Present Illness:    Erik [name] is a 83 y.o. male who I initially saw in cardiology consultation in April 2016 while he was in the hospital after he experienced a brief episode of chest discomfort associated with presyncope and transient visual blurring.  Physical examination suggested a murmur of severe aortic stenosis.  This was verified by echo Doppler data, which suggested critical AS.  I performed a right and left heart catheterization 06/22/2014.  He was found to have critical aortic valve stenosis without significant coronary obstructive disease with only a smooth 20% luminal narrowing of the proximal RCA and moderate global LV dysfunction with an EF of 35-40%.  He had upper normal right heart pressures.  He underwent aortic valve replacement surgery by Dr. [name] using a bioprosthetic tissue valve on  06/30/2014.  During his hospitalization, he was taken off all beta blockers because of bradycardia.  Ultimately, beta blocker was reinstituted.  He underwent cardioversion on 10/09/2014 after being on Coumadin anticoagulation for over a month.  He was seen on 10/25/2014 by Erik [name], which time he was found to be back in atrial flutter.  He subsequent was referred to Dr. [name] who saw him on 11/29/2014.  He underwent successful atrial flutter ablation by Dr. [name] on 12/19/2014.  Subsequently, he was taken off essentially all medications with the exception of aspirin.  He is not aware of any recurrent atrial flutter or atrial fibrillation.    A follow-up echo Doppler study in 06/25/2015 showed an ejection fraction of 60-65% with normal wall motion and diastolic function.  His aortic valve bioprosthesis was well-seated.  There was no stenosis.  The mean gradient was a peak gradient 15 concordant with the valve.  His right atrium was mild moderately dilated.  He has seen Dr. [name] for follow-up evaluation who felt he was stable from his surgical standpoint.  Mr. Shahan continues to feel well.  When I last saw him, he had an atrial bigeminal pattern.  He has had issues in the past with bradycardia. He is not on any medication except ASA 81 mg and denies any recurrent awareness of arrhythmia.  He continues to work in his son's [employer] and remains very active.  He denies chest pain, PND, orthopnea.  He denies presyncope or syncope.  A one-year follow-up echo Doppler study on 05/20/2016 revealed an ejection fraction at  50-55%.  His bioprosthetic  aortic valve was well-seated.  There was no aortic regurgitation or perivalvular leak.  Mean gradient was 7 and peak gradient 12.  There was trivial TR and PR and his left atrium was mildly dilated.   When I saw him in November 2018 he denied any chest pain or shortness of breath, dizziness or palpitations.  He was very active fixing up his house  anticipation of selling his house and ultimately moving to [place].  Asymptomatic and denies chest pain, shortness of breath, dizziness, or palpitations.  He remains very active.  He has been fixing up his house in anticipation of selling his house and ultimately moving to [facility]. Lab work which showed his LDL cholesterol had risen to 113.  Is only been taking a baby aspirin.  TSH was normal at 2.42.   I  saw him in May 2019 at which time he remained stable.  He was found to have macrocytosis on his CBC with an MCV of 102.  Subsequent B12 and folate levels were normal.  Since I saw him, he has moved to [place] instead of his previous plan to move to [facility].  He is enjoyed being in the open.  He remains very active on his land.  He denies chest pain PND orthopnea, presyncope or syncope or palpitations.  Since I last saw him in December 2019 he continues to be active.  He lives in the [place] area.  He walks outside, does yardwork, mowing, and keeps himself very busy.  He is asymptomatic and denies chest pain PND orthopnea palpitations or any shortness of breath.  He underwent a follow-up echo Doppler study on August 24, 2018.  This continued to show normal systolic function as well as normal diastolic function.  There was abnormal septal motion consistent with postoperative state.  There was mild biatrial enlargement.  His 25 mm Edwards bovine bioprosthesis valve was well-seated and functioning normally. The mean gradient was 5 with a peak gradient of 14, normal for the valve.  He presents for evaluation.  The patient does not have symptoms concerning for COVID-19 infection (fever, chills, cough, or new shortness of breath).    Past Medical History:  Diagnosis Date  . Aortic stenosis, severe   . Atrial flutter, post-operative 07/27/2014   typical   . Chronic diastolic congestive heart failure ([facility])   . S/P aortic valve replacement with bioprosthetic valve 06/30/2014   25  mm [facility] Ease bovine pericardial tissue valve  . Sinus bradycardia    Past Surgical History:  Procedure Laterality Date  . AORTIC VALVE REPLACEMENT N/A 06/30/2014   Procedure: AORTIC VALVE REPLACEMENT (AVR);  Surgeon: Rexene Alberts, MD;  Location: [place];  Service: Open Heart Surgery;  Laterality: N/A;  . APPENDECTOMY    . CARDIOVERSION N/A 10/09/2014   Procedure: CARDIOVERSION;  Surgeon: Troy Sine, MD;  Location: [place];  Service: Cardiovascular;  Laterality: N/A;  . CATARACT EXTRACTION Bilateral ? 2013    & 2014  . ELECTROPHYSIOLOGIC STUDY N/A 12/19/2014   CTI ablation by Dr Erik [name]  . HAND SURGERY    . INTRAOPERATIVE TRANSESOPHAGEAL ECHOCARDIOGRAM N/A 06/30/2014   Procedure: INTRAOPERATIVE TRANSESOPHAGEAL ECHOCARDIOGRAM;  Surgeon: Rexene Alberts, MD;  Location: [place];  Service: Open Heart Surgery;  Laterality: N/A;  . LEFT AND RIGHT HEART CATHETERIZATION WITH CORONARY ANGIOGRAM N/A 06/22/2014   Procedure: LEFT AND RIGHT HEART CATHETERIZATION WITH CORONARY ANGIOGRAM;  Surgeon: Troy Sine, MD;  Location: [facility]  CATH LAB;  Service: Cardiovascular;  Laterality: N/A;     Current Meds  Medication Sig  . aspirin EC 81 MG tablet Take 81 mg by mouth daily.  . rosuvastatin (CRESTOR) 10 MG tablet Take 1 tablet (10 mg total) by mouth daily.  . [DISCONTINUED] rosuvastatin (CRESTOR) 10 MG tablet Take 1 tablet (10 mg total) by mouth daily.     Allergies:   Patient has no known allergies.   Social History   Tobacco Use  . Smoking status: Never Smoker  . Smokeless tobacco: Never Used  . Tobacco comment: smoked in college  Substance Use Topics  . Alcohol use: No    Comment: occ   . Drug use: No     Family Hx: The patient's family history includes Cancer in his father; Hypertension in his mother; Stroke in his mother.  ROS:   Please see the history of present illness.    No fevers chills night sweats No cough no change in taste or smell No shortness of breath No chest  pain PND orthopnea palpitations presyncope or syncope No abdominal complaints No myalgias No neurologic issues Sleeping well All other systems reviewed and are negative.   Prior CV studies:   The following studies were reviewed today:  ECHO 08/24/2018 IMPRESSIONS  1. The left ventricle has normal systolic function, with an ejection fraction of 55-60%. The cavity size was normal. Left ventricular diastolic parameters were normal. There is abnormal septal motion consistent with post-operative status.  2. The right ventricle has normal systolic function. The cavity was normal. There is no increase in right ventricular wall thickness. Right ventricular systolic pressure could not be assessed.  3. Left atrial size was mildly dilated.  4. Right atrial size was mildly dilated.  5. A 18mm an Edwards bovine bioprosthesis valve is present in the aortic position. Procedure Date: 06/30/14.  6. The aortic root and ascending aorta are normal in size and structure.  7. When compared to the prior study: no significant change since 2018.  Labs/Other Tests and Data Reviewed:    EKG:  An ECG dated 02/16/2018 was personally reviewed today and demonstrated:  Sinus bradycardia 54 bpm.  First-degree AV block with a PR interval at 234 ms.  Poor anterior R wave progression  Recent Labs: 02/08/2018: ALT 7; BUN 12; Creatinine, Ser 1.18; Potassium 5.0; Sodium 142   Recent Lipid Panel Lab Results  Component Value Date/Time   CHOL 127 02/08/2018 08:59 AM   TRIG 80 02/08/2018 08:59 AM   HDL 34 (L) 02/08/2018 08:59 AM   CHOLHDL 3.7 02/08/2018 08:59 AM   CHOLHDL 4.8 06/11/2016 11:20 AM   LDLCALC 77 02/08/2018 08:59 AM    Wt Readings from Last 3 Encounters:  09/02/18 177 lb (80.3 kg)  02/16/18 180 lb 9.6 oz (81.9 kg)  07/31/17 188 lb 9.6 oz (85.5 kg)     Objective:    Vital Signs:  BP 130/67   Pulse (!) 59   Ht 6\' 1"  (1.854 m)   Wt 177 lb (80.3 kg)   BMI 23.35 kg/m    Since this was a phone visit I  was unable to examine him.  However he denies any change in physical appearance since my last examination in December 2019.   Breathing is normal and nonlabored There is no audible wheezing Palpation of his pulse revealed a normal rhythm He did denied any chest wall tenderness with palpation of his chest or abdomen abdomen.   There was no edema.  He denied neurologic symptoms He has normal affect and cognition   ASSESSMENT & PLAN:    1. Aortic valve stenosis/status post AVR: He was found to have severe aortic valve stenosis in April 2016 and underwent successful valve replacement with a 25 mm Edwards magna ease bovine pericardial tissue valve.  I reviewed his most recent echo Doppler study which continues to show valve stability.  His gradient is normal.  There is no aortic insufficiency. 2. History of atrial flutter: Status post atrial flutter ablation in October 2016 without recurrence.  He continues to be on aspirin and remotely was on Coumadin which was later discontinued. 3. Hyperlipidemia: Lipid studies have improved since initiation of rosuvastatin which most recent  LDL 77, improved from 114.  He is tolerating well without myalgias  COVID-19 Education: The signs and symptoms of COVID-19 were discussed with the patient and how to seek care for testing (follow up with PCP or arrange E-visit).  The importance of social distancing was discussed today.  Time:   Today, I have spent 16 minutes with the patient with telehealth technology discussing the above problems.     Medication Adjustments/Labs and Tests Ordered: Current medicines are reviewed at length with the patient today.  Concerns regarding medicines are outlined above.   Tests Ordered: No orders of the defined types were placed in this encounter.   Medication Changes: No orders of the defined types were placed in this encounter.   Follow Up: In office visit in 6 months  Signed, Erik Majestic, MD  09/02/2018 10:18 AM     [place]

## 2018-09-02 NOTE — Patient Instructions (Signed)

## 2019-02-22 ENCOUNTER — Telehealth: Payer: Self-pay | Admitting: Cardiovascular Disease

## 2019-02-22 NOTE — Telephone Encounter (Signed)
Mykelti Mominee (wife) notified of [place] she will have pt call when TK enterers the room

## 2019-02-22 NOTE — Telephone Encounter (Signed)
New Message  Patient's wife is calling in to get approval to accompany the patient in to his appointment on 03/02/19 with Dr. Claiborne Billings. Patient needs his wife there with him. Please call and confirm.

## 2019-03-01 ENCOUNTER — Ambulatory Visit: Payer: Medicare Other | Admitting: Cardiovascular Disease

## 2019-03-02 ENCOUNTER — Ambulatory Visit: Payer: Medicare Other | Admitting: Cardiovascular Disease

## 2019-04-11 ENCOUNTER — Encounter: Payer: Self-pay | Admitting: Cardiovascular Disease

## 2019-04-11 ENCOUNTER — Ambulatory Visit (INDEPENDENT_AMBULATORY_CARE_PROVIDER_SITE_OTHER): Payer: Medicare Other | Admitting: Cardiovascular Disease

## 2019-04-11 ENCOUNTER — Other Ambulatory Visit: Payer: Self-pay

## 2019-04-11 VITALS — BP 120/70 | HR 63 | Ht 73.0 in | Wt 190.0 lb

## 2019-04-11 DIAGNOSIS — Z79899 Other long term (current) drug therapy: Secondary | ICD-10-CM

## 2019-04-11 DIAGNOSIS — I491 Atrial premature depolarization: Secondary | ICD-10-CM

## 2019-04-11 DIAGNOSIS — Z953 Presence of xenogenic heart valve: Secondary | ICD-10-CM

## 2019-04-11 DIAGNOSIS — E785 Hyperlipidemia, unspecified: Secondary | ICD-10-CM

## 2019-04-11 DIAGNOSIS — Z125 Encounter for screening for malignant neoplasm of prostate: Secondary | ICD-10-CM

## 2019-04-11 DIAGNOSIS — I483 Typical atrial flutter: Secondary | ICD-10-CM

## 2019-04-11 NOTE — Progress Notes (Signed)
Patient ID: Erik [name], male   DOB: [birth year], 84 y.o.   MRN: [id number]    PCP: Dr. Jani [name]  HPI: Erik [name] is a 84 y.o. male who presents to the office today for a 7 month follow up cardiology evaluation.  I initially saw Erik [name] in cardiology consultation in April 2016 while he was in the hospital after he experienced a brief episode of chest discomfort associated with presyncope and transient visual blurring.  Physical examination suggested a murmur of severe aortic stenosis.  This was verified by echo Doppler data, which suggested critical AS.  I performed a right and left heart catheterization 06/22/2014.  He was found to have critical aortic valve stenosis without significant coronary obstructive disease with only a smooth 20% luminal narrowing of the proximal RCA and moderate global LV dysfunction with an EF of 35-40%.  He had upper normal right heart pressures.  He underwent aortic valve replacement surgery by Dr. Roxy Manns using a bioprosthetic tissue valve on 06/30/2014.  During his hospitalization, he was taken off all beta blockers because of bradycardia.  Ultimately, beta blocker was reinstituted.  He underwent cardioversion on 10/09/2014 after being on Coumadin anticoagulation for over a month.  He was seen on 10/25/2014 by [name], which time he was found to be back in atrial flutter.  He subsequent was referred to Dr. Rayann Heman who saw him on 11/29/2014.  He underwent successful atrial flutter ablation by Dr. Rayann Heman on 12/19/2014.  Subsequently, he was taken off essentially all medications with the exception of aspirin.  He is not aware of any recurrent atrial flutter or atrial fibrillation.    A follow-up echo Doppler study in 06/25/2015 showed an ejection fraction of 60-65% with normal wall motion and diastolic function.  His aortic valve bioprosthesis was [name]-seated.  There was no stenosis.  The mean gradient was a peak gradient 15 concordant with the valve.  His right atrium was  mild moderately dilated.  He has seen Dr. Roxy Manns for follow-up evaluation who felt he was stable from his surgical standpoint.  Erik [name].  When I last saw him, he had an atrial bigeminal pattern.  He has had issues in the past with bradycardia. He is not on any medication except ASA 81 mg and denies any recurrent awareness of arrhythmia.  He continues to work in his son's [place] and remains very active.  He denies chest pain, PND, orthopnea.  He denies presyncope or syncope.  A one-year follow-up echo Doppler study on 05/20/2016 revealed an ejection fraction at  50-55%.  His bioprosthetic aortic valve was [name]-seated.  There was no aortic regurgitation or perivalvular leak.  Mean gradient was 7 and peak gradient 12.  There was trivial TR and PR and his left atrium was mildly dilated.   When I saw him in November 2018 he denied any chest pain or shortness of breath, dizziness or palpitations. At that time he was asymptomatic and denied chest pain, shortness of breath, dizziness, or palpitations. He had been fixing up his house in anticipation of selling his house and ultimately moving to [facility]. Lab work which showed his LDL cholesterol had risen to 113.  Is only been taking a baby aspirin.  TSH was normal at 2.42.   When I saw him in May 2019 he remained stable.  He was found to have macrocytosis on his CBC with an MCV of 102.  Subsequent B12 and folate levels were normal.  He remains very active on his land.  He denies chest pain PND orthopnea, presyncope or syncope or palpitations.    I saw him in December 2019  and last saw him in a telemedicine visit on September 02, 2018.  At that time he continued to be active.  He lives in the [place] area.  He walks outside, does yardwork, mowing, and keeps himself very busy.  He is asymptomatic and denies chest pain PND orthopnea palpitations or any shortness of breath.  He underwent a follow-up echo Doppler study on  August 24, 2018.  This continued to show normal systolic function as [name] as normal diastolic function.  There was abnormal septal motion consistent with postoperative state.  There was mild biatrial enlargement.  His 25 mm Edwards bovine bioprosthesis valve was [name]-seated and functioning normally. The mean gradient was 5 with a peak gradient of 14, normal for the valve.   Since his telemedicine evaluation in June 2018, he continues to do [name].  He lives at the [place].  In the winter months there has not been many people nearby.  He denies chest pain PND orthopnea.  He denies palpitations presyncope or syncope.  He has not had recent laboratory.  He presents for evaluation.   Past Medical History:  Diagnosis Date  . Aortic stenosis, severe   . Atrial flutter, post-operative 07/27/2014   typical   . Chronic diastolic congestive heart failure ([place])   . S/P aortic valve replacement with bioprosthetic valve 06/30/2014   25 mm [facility] Ease bovine pericardial tissue valve  . Sinus bradycardia     Past Surgical History:  Procedure Laterality Date  . AORTIC VALVE REPLACEMENT N/A 06/30/2014   Procedure: AORTIC VALVE REPLACEMENT (AVR);  Surgeon: Rexene Alberts, MD;  Location: [facility];  Service: Open Heart Surgery;  Laterality: N/A;  . APPENDECTOMY    . CARDIOVERSION N/A 10/09/2014   Procedure: CARDIOVERSION;  Surgeon: Troy Sine, MD;  Location: [facility];  Service: Cardiovascular;  Laterality: N/A;  . CATARACT EXTRACTION Bilateral ? 2013    & 2014  . ELECTROPHYSIOLOGIC STUDY N/A 12/19/2014   CTI ablation by Dr Rayann Heman  . HAND SURGERY    . INTRAOPERATIVE TRANSESOPHAGEAL ECHOCARDIOGRAM N/A 06/30/2014   Procedure: INTRAOPERATIVE TRANSESOPHAGEAL ECHOCARDIOGRAM;  Surgeon: Rexene Alberts, MD;  Location: [facility];  Service: Open Heart Surgery;  Laterality: N/A;  . LEFT AND RIGHT HEART CATHETERIZATION WITH CORONARY ANGIOGRAM N/A 06/22/2014   Procedure: LEFT AND RIGHT HEART CATHETERIZATION WITH CORONARY  ANGIOGRAM;  Surgeon: Troy Sine, MD;  Location: [facility] CATH LAB;  Service: Cardiovascular;  Laterality: N/A;    No Known Allergies  Current Outpatient Medications  Medication Sig Dispense Refill  . aspirin EC 81 MG tablet Take 81 mg by mouth daily.    . rosuvastatin (CRESTOR) 10 MG tablet Take 1 tablet (10 mg total) by mouth daily. 90 tablet 3   No current facility-administered medications for this visit.    Social History   Socioeconomic History  . Marital status: Married    Spouse name: Not on file  . Number of children: Not on file  . Years of education: Not on file  . Highest education level: Not on file  Occupational History  . Not on file  Tobacco Use  . Smoking status: Never Smoker  . Smokeless tobacco: Never Used  . Tobacco comment: smoked in college  Substance and Sexual Activity  . Alcohol use: No    Comment: occ   .  Drug use: No  . Sexual activity: Yes  Other Topics Concern  . Not on file  Social History Narrative   Pt lives in [place] with spouse.  Retired Scientist, physiological.   Social Determinants of Health   Financial Resource Strain:   . Difficulty of Paying Living Expenses: Not on file  Food Insecurity:   . Worried About Charity fundraiser in the Last Year: Not on file  . Ran Out of Food in the Last Year: Not on file  Transportation Needs:   . Lack of Transportation (Medical): Not on file  . Lack of Transportation (Non-Medical): Not on file  Physical Activity:   . Days of Exercise per Week: Not on file  . Minutes of Exercise per Session: Not on file  Stress:   . Feeling of Stress : Not on file  Social Connections:   . Frequency of Communication with Friends and Family: Not on file  . Frequency of Social Gatherings with Friends and Family: Not on file  . Attends Religious Services: Not on file  . Active Member of Clubs or Organizations: Not on file  . Attends Archivist Meetings: Not on file  . Marital Status: Not on file   Intimate Partner Violence:   . Fear of Current or Ex-Partner: Not on file  . Emotionally Abused: Not on file  . Physically Abused: Not on file  . Sexually Abused: Not on file    Family History  Problem Relation Age of Onset  . Stroke Mother   . Hypertension Mother   . Cancer Father     ROS General: Negative; No fevers, chills, or night sweats HEENT: Negative; No changes in vision or hearing, sinus congestion, difficulty swallowing Pulmonary: Negative; No cough, wheezing, shortness of breath, hemoptysis Cardiovascular: See HPI:  GI: Negative; No nausea, vomiting, diarrhea, or abdominal pain GU: Negative; No dysuria, hematuria, or difficulty voiding Musculoskeletal: Negative; no myalgias, joint pain, or weakness Hematologic: Negative; no easy bruising, bleeding Endocrine: Negative; no heat/cold intolerance; no diabetes, Neuro: Negative; no changes in balance, headaches Skin: Negative; No rashes or skin lesions Psychiatric: Negative; No behavioral problems, depression Sleep: Negative; No snoring,  daytime sleepiness, hypersomnolence, bruxism, restless legs, hypnogognic hallucinations. Other comprehensive 14 point system review is negative   Physical Exam BP 120/70   Pulse 63   Ht '6\' 1"'$  (1.854 m)   Wt 190 lb (86.2 kg)   BMI 25.07 kg/m    Repeat blood pressure by me was 130/70  Wt Readings from Last 3 Encounters:  04/11/19 190 lb (86.2 kg)  09/02/18 177 lb (80.3 kg)  02/16/18 180 lb 9.6 oz (81.9 kg)   General: Alert, oriented, no distress.  Skin: normal turgor, no rashes, warm and dry HEENT: Normocephalic, atraumatic. Pupils equal round and reactive to light; sclera anicteric; extraocular muscles intact;  Nose without nasal septal hypertrophy Mouth/Parynx benign; Mallinpatti scale 3 Neck: No JVD, no carotid bruits; normal carotid upstroke Lungs: clear to ausculatation and percussion; no wheezing or rales Chest wall: Very mild pectus excavatum; no tenderness to  palpitation Heart: PMI not displaced, RRR, s1 s2 normal, 7-9/3 systolic murmur in the aortic area compatible with his bioprosthetic AVR, no diastolic murmur, no rubs, gallops, thrills, or heaves Abdomen: soft, nontender; no hepatosplenomehaly, BS+; abdominal aorta nontender and not dilated by palpation. Back: no CVA tenderness Pulses 2+ Musculoskeletal: full range of motion, normal strength, no joint deformities Extremities: no clubbing cyanosis or edema, Homan's sign negative  Neurologic: grossly nonfocal;  Cranial nerves grossly wnl Psychologic: Normal mood and affect   ECG (independently read by me): Sinus rhythm with an isolated PVC and occasional PACs.  December 2019 ECG (independently read by me): Sinus bradycardia 54 bpm.  First-degree AV block with a PR interval at 234 ms.  Poor anterior R wave progression  May 2019 ECG (independently read by me): Sinus bradycardia at 57 bpm.  First-degree AV block with a PR interval at 232 ms.  PAC.  QS complex.  November 2018 ECG (independently read by me): Sinus bradycardia 54 bpm with sinus arrhythmia.  First degree AV block with a PR interval at 214 ms.  PAC.  Poor anterior R-wave progression V1 through V4.  March 2018 ECG (independently read by me): Sinus bradycardia with first degree AV block.  PAC is in an atrial bigeminal rhythm.    September 2017 ECG (independently read by me): Sinus bradycardia 53 bpm.  First-degree AV block.  There are PACs and atrial bigeminal pattern.  He has poor anterior R-wave voltage.  January 2017 ECG (independently read by me): Sinus bradycardia 50 bpm with first-degree AV block with a PR interval at 234 ms and evidence for occasional PAC.  September 2016 ECG (independently read by me): Atrial flutter with 4:1 block at 64 bpm.  09/11/2014 ECG (independently read by me): Atrial flutter with 41 block.  One isolated PVC.  Ventricular rate 67 bpm.  Nonspecific ST-T change  Prior ECG (independently read by me):  Underlying atrial flutter with a ventricular rate at 88 with frequent PVCs in a bigeminal pattern transiently.  LABS:  BMP Latest Ref Rng & Units 02/08/2018 07/20/2017 06/11/2016  Glucose 65 - 99 mg/dL 99 85 96  BUN 8 - 27 mg/dL '12 18 16  '$ Creatinine 0.76 - 1.27 mg/dL 1.18 1.15 1.20(H)  BUN/Creat Ratio 10 - '24 10 16 '$ -  Sodium 134 - 144 mmol/L 142 142 141  Potassium 3.5 - 5.2 mmol/L 5.0 5.0 5.0  Chloride 96 - 106 mmol/L 103 105 106  CO2 20 - 29 mmol/L '24 20 24  '$ Calcium 8.6 - 10.2 mg/dL 9.6 8.9 9.7    Hepatic Function Latest Ref Rng & Units 02/08/2018 07/20/2017 06/11/2016  Total Protein 6.0 - 8.5 g/dL 7.2 6.9 7.6  Albumin 3.5 - 4.7 g/dL 4.4 4.3 4.1  AST 0 - 40 IU/L '11 13 13  '$ ALT 0 - 44 IU/L '7 8 9  '$ Alk Phosphatase 39 - 117 IU/L 84 68 66  Total Bilirubin 0.0 - 1.2 mg/dL 0.8 0.6 0.9    CBC Latest Ref Rng & Units 07/20/2017 06/11/2016 06/22/2015  WBC 3.4 - 10.8 x10E3/uL 6.0 5.8 4.6  Hemoglobin 13.0 - 17.7 g/dL 14.1 14.7 13.4  Hematocrit 37.5 - 51.0 % 40.5 43.4 39.7  Platelets 150 - 379 x10E3/uL 240 231 217   Lab Results  Component Value Date   MCV 102 (H) 07/20/2017   MCV 100.0 06/11/2016   MCV 98.3 06/22/2015    Lab Results  Component Value Date   TSH 2.420 07/20/2017    BNP    Component Value Date/Time   BNP 1,282.9 (H) 06/19/2014 1630    ProBNP No results found for: PROBNP   Lipid Panel     Component Value Date/Time   CHOL 127 02/08/2018 0859   TRIG 80 02/08/2018 0859   HDL 34 (L) 02/08/2018 0859   CHOLHDL 3.7 02/08/2018 0859   CHOLHDL 4.8 06/11/2016 1120   VLDL 19 06/11/2016 1120   LDLCALC 77 02/08/2018 0859  RADIOLOGY: No results found.  IMPRESSION:  1. S/P aortic valve replacement with bioprosthetic valve   2. Typical atrial flutter [facility]): s/p ablation   3. Hyperlipidemia, unspecified hyperlipidemia type   4. Premature atrial contractions   5. Medication management   6. Screening PSA (prostate specific antigen)     ASSESSMENT AND PLAN: Mr.  Brantley [name] is a young appearing 84 year-old gentleman who was found to have severe symptomatic critical aortic valve stenosis after he presented to the hospital following a presyncopal spell in 2016.  He successfully underwent aortic valve replacement surgery on 06/30/2014 with a 25 mm [facility] Ease bovine pericardial tissue valve.  He developed atrial flutter in October 2016 and underwent successful ablation.  He was taken off warfarin therapy as [name] as beta blocker therapy.  He continues to be on aspirin alone.  In the past he has been demonstrated to have an atrial bigeminal rhythm and some bradycardia.  He has not been on beta-blocker therapy.  ECG today shows sinus rhythm with an isolated PVC and several P ACEs.  His blood pressure today is stable.  He is not on any therapy with the exception of aspirin in addition to rosuvastatin.  He is unaware of any palpitations or heart rate irregularity.  He is not having any anginal symptoms.  I reviewed his most recent echo Doppler study from June 2020 which continued to show normal systolic function with EF at 55 to 60%.  There was mild biatrial enlargement.  His 25 mm Edwards bovine bioprosthetic valve was [name]-seated and functioning normally and he had only a minimal gradient with a mean gradient of 5 and peak gradient of 14.  He continues to be on rosuvastatin for hyperlipidemia.  He is not had recent laboratory.  He is fasting today and I will check a comprehensive metabolic panel, CBC, free T4, TSH H, lipid panel, and he is also requested a PSA be checked.  I will contact him regarding the results.  I will see him in 6 months for reevaluation or sooner as needed.  Time spent: 25 minutes Troy Sine, MD, [facility]  04/11/2019 12:15 PM

## 2019-04-11 NOTE — Patient Instructions (Signed)
Medication Instructions:  Continue current medications  *If you need a refill on your cardiac medications before your next appointment, please call your pharmacy*  Lab Work: Fasting Lipid, CMP, CBC, Free T4, TSH and PSA  If you have labs (blood work) drawn today and your tests are completely normal, you will receive your results only by: Marland Kitchen MyChart Message (if you have MyChart) OR . A paper copy in the mail If you have any lab test that is abnormal or we need to change your treatment, we will call you to review the results.  Testing/Procedures: None Ordered  Follow-Up: At [facility], you and your health needs are our priority.  As part of our continuing mission to provide you with exceptional heart care, we have created designated Provider Care Teams.  These Care Teams include your primary Cardiologist (physician) and Advanced Practice Providers (APPs -  Physician Assistants and Nurse Practitioners) who all work together to provide you with the care you need, when you need it.  Your next appointment:   6 month(s)  The format for your next appointment:   In Person  Provider:   Shelva Majestic, MD

## 2019-04-12 LAB — COMPREHENSIVE METABOLIC PANEL
ALT: 14 IU/L (ref 0–44)
AST: 21 IU/L (ref 0–40)
Albumin/Globulin Ratio: 1.5 (ref 1.2–2.2)
Albumin: 4.6 g/dL (ref 3.6–4.6)
Alkaline Phosphatase: 80 IU/L (ref 39–117)
BUN/Creatinine Ratio: 14 (ref 10–24)
BUN: 13 mg/dL (ref 8–27)
Bilirubin Total: 1.1 mg/dL (ref 0.0–1.2)
CO2: 23 mmol/L (ref 20–29)
Calcium: 9.3 mg/dL (ref 8.6–10.2)
Chloride: 106 mmol/L (ref 96–106)
Creatinine, Ser: 0.96 mg/dL (ref 0.76–1.27)
GFR calc Af Amer: 85 mL/min/{1.73_m2} (ref >59)
GFR calc non Af Amer: 74 mL/min/{1.73_m2} (ref >59)
Globulin, Total: 3.1 g/dL (ref 1.5–4.5)
Glucose: 80 mg/dL (ref 65–99)
Potassium: 4.7 mmol/L (ref 3.5–5.2)
Sodium: 146 mmol/L — ABNORMAL HIGH (ref 134–144)
Total Protein: 7.7 g/dL (ref 6.0–8.5)

## 2019-04-12 LAB — LIPID PANEL
Chol/HDL Ratio: 3.2 ratio (ref 0.0–5.0)
Cholesterol, Total: 140 mg/dL (ref 100–199)
HDL: 44 mg/dL (ref >39)
LDL Chol Calc (NIH): 82 mg/dL (ref 0–99)
Triglycerides: 71 mg/dL (ref 0–149)
VLDL Cholesterol Cal: 14 mg/dL (ref 5–40)

## 2019-04-12 LAB — CBC
Hematocrit: 40.9 % (ref 37.5–51.0)
Hemoglobin: 14.2 g/dL (ref 13.0–17.7)
MCH: 34.7 pg — ABNORMAL HIGH (ref 26.6–33.0)
MCHC: 34.7 g/dL (ref 31.5–35.7)
MCV: 100 fL — ABNORMAL HIGH (ref 79–97)
Platelets: 209 10*3/uL (ref 150–450)
RBC: 4.09 x10E6/uL — ABNORMAL LOW (ref 4.14–5.80)
RDW: 11.9 % (ref 11.6–15.4)
WBC: 6.7 10*3/uL (ref 3.4–10.8)

## 2019-04-12 LAB — PSA: Prostate Specific Ag, Serum: 2.3 ng/mL (ref 0.0–4.0)

## 2019-04-12 LAB — TSH: TSH: 1.21 u[IU]/mL (ref 0.450–4.500)

## 2019-04-12 LAB — T4, FREE: Free T4: 1.32 ng/dL (ref 0.82–1.77)

## 2019-10-14 ENCOUNTER — Encounter: Payer: Self-pay | Admitting: Cardiovascular Disease

## 2019-10-14 ENCOUNTER — Ambulatory Visit (INDEPENDENT_AMBULATORY_CARE_PROVIDER_SITE_OTHER): Payer: Medicare Other | Admitting: Cardiovascular Disease

## 2019-10-14 ENCOUNTER — Other Ambulatory Visit: Payer: Self-pay

## 2019-10-14 VITALS — BP 134/76 | HR 55 | Temp 98.1°F | Ht 73.0 in | Wt 188.0 lb

## 2019-10-14 DIAGNOSIS — Z953 Presence of xenogenic heart valve: Secondary | ICD-10-CM

## 2019-10-14 DIAGNOSIS — I483 Typical atrial flutter: Secondary | ICD-10-CM

## 2019-10-14 DIAGNOSIS — E785 Hyperlipidemia, unspecified: Secondary | ICD-10-CM | POA: Diagnosis not present

## 2019-10-14 DIAGNOSIS — I491 Atrial premature depolarization: Secondary | ICD-10-CM | POA: Diagnosis not present

## 2019-10-14 DIAGNOSIS — I440 Atrioventricular block, first degree: Secondary | ICD-10-CM

## 2019-10-14 NOTE — Progress Notes (Signed)
Patient ID: Erik [name], male   DOB: [birth year], 84 y.o.   MRN: [id number]    PCP: Dr. Jani Gravel  HPI: Erik [name] is a 84 y.o. male who presents to the office today for a 7 month follow up cardiology evaluation.  I initially saw Erik [name] in cardiology consultation in April 2016 while he was in the hospital after he experienced a brief episode of chest discomfort associated with presyncope and transient visual blurring.  Physical examination suggested a murmur of severe aortic stenosis.  This was verified by echo Doppler data, which suggested critical AS.  I performed a right and left heart catheterization 06/22/2014.  He was found to have critical aortic valve stenosis without significant coronary obstructive disease with only a smooth 20% luminal narrowing of the proximal RCA and moderate global LV dysfunction with an EF of 35-40%.  He had upper normal right heart pressures.  He underwent aortic valve replacement surgery by Dr. [name] using a bioprosthetic tissue valve on 06/30/2014.  During his hospitalization, he was taken off all beta blockers because of bradycardia.  Ultimately, beta blocker was reinstituted.  He underwent cardioversion on 10/09/2014 after being on Coumadin anticoagulation for over a month.  He was seen on 10/25/2014 by [name], which time he was found to be back in atrial flutter.  He subsequent was referred to Dr. [name] who saw him on 11/29/2014.  He underwent successful atrial flutter ablation by Dr. [name] on 12/19/2014.  Subsequently, he was taken off essentially all medications with the exception of aspirin.  He is not aware of any recurrent atrial flutter or atrial fibrillation.    A follow-up echo Doppler study in 06/25/2015 showed an ejection fraction of 60-65% with normal wall motion and diastolic function.  His aortic valve bioprosthesis was well-seated.  There was no stenosis.  The mean gradient was a peak gradient 15 concordant with the valve.  His right atrium was  mild moderately dilated.  He has seen Dr. [name] for follow-up evaluation who felt he was stable from his surgical standpoint.  Erik [name] continues to feel well.  When I last saw him, he had an atrial bigeminal pattern.  He has had issues in the past with bradycardia. He is not on any medication except ASA 81 mg and denies any recurrent awareness of arrhythmia.  He continues to work in his son's [place] and remains very active.  He denies chest pain, PND, orthopnea.  He denies presyncope or syncope.  A one-year follow-up echo Doppler study on 05/20/2016 revealed an ejection fraction at  50-55%.  His bioprosthetic aortic valve was well-seated.  There was no aortic regurgitation or perivalvular leak.  Mean gradient was 7 and peak gradient 12.  There was trivial TR and PR and his left atrium was mildly dilated.   When I saw him in November 2018 he denied any chest pain or shortness of breath, dizziness or palpitations. At that time he was asymptomatic and denied chest pain, shortness of breath, dizziness, or palpitations. He had been fixing up his house in anticipation of selling his house and ultimately moving to [facility]. Lab work which showed his LDL cholesterol had risen to 113.  Is only been taking a baby aspirin.  TSH was normal at 2.42.   When I saw him in May 2019 he remained stable.  He was found to have macrocytosis on his CBC with an MCV of 102.  Subsequent B12 and folate levels were normal.  He remains very active on his land.  He denies chest pain PND orthopnea, presyncope or syncope or palpitations.    I saw him in December 2019  and last saw him in a telemedicine visit on September 02, 2018.  At that time he continued to be active.  He lives in the [place] area.  He walks outside, does yardwork, mowing, and keeps himself very busy.  He is asymptomatic and denies chest pain PND orthopnea palpitations or any shortness of breath.  He underwent a follow-up echo Doppler study on  August 24, 2018.  This continued to show normal systolic function as well as normal diastolic function.  There was abnormal septal motion consistent with postoperative state.  There was mild biatrial enlargement.  His 25 mm Edwards bovine bioprosthesis valve was well-seated and functioning normally. The mean gradient was 5 with a peak gradient of 14, normal for the valve.   I last saw him in January 2021.  [place] months there had not been many people there and he was staying safe with reference to COVID-19.  He denied palpitations, presyncope or syncope.  Since I last saw him, he has continued to feel well.  He remains asymptomatic and lives on [place].  At times he notes a rare palpitation, isolated.  He denies presyncope or syncope.  He has not had recent laboratory.  He remains active.  He presents for evaluation  Past Medical History:  Diagnosis Date  . Aortic stenosis, severe   . Atrial flutter, post-operative 07/27/2014   typical   . Chronic diastolic congestive heart failure ([place])   . S/P aortic valve replacement with bioprosthetic valve 06/30/2014   25 mm [facility] Ease bovine pericardial tissue valve  . Sinus bradycardia     Past Surgical History:  Procedure Laterality Date  . AORTIC VALVE REPLACEMENT N/A 06/30/2014   Procedure: AORTIC VALVE REPLACEMENT (AVR);  Surgeon: Rexene Alberts, MD;  Location: [place];  Service: Open Heart Surgery;  Laterality: N/A;  . APPENDECTOMY    . CARDIOVERSION N/A 10/09/2014   Procedure: CARDIOVERSION;  Surgeon: Troy Sine, MD;  Location: [place];  Service: Cardiovascular;  Laterality: N/A;  . CATARACT EXTRACTION Bilateral ? 2013    & 2014  . ELECTROPHYSIOLOGIC STUDY N/A 12/19/2014   CTI ablation by Dr [name]  . HAND SURGERY    . INTRAOPERATIVE TRANSESOPHAGEAL ECHOCARDIOGRAM N/A 06/30/2014   Procedure: INTRAOPERATIVE TRANSESOPHAGEAL ECHOCARDIOGRAM;  Surgeon: Rexene Alberts, MD;  Location: [place];  Service: Open Heart Surgery;  Laterality: N/A;  . LEFT AND RIGHT HEART CATHETERIZATION WITH CORONARY ANGIOGRAM N/A 06/22/2014   Procedure: LEFT AND RIGHT HEART CATHETERIZATION WITH CORONARY ANGIOGRAM;  Surgeon: Troy Sine, MD;  Location: [facility] CATH LAB;  Service: Cardiovascular;  Laterality: N/A;    No Known Allergies  Current Outpatient Medications  Medication Sig Dispense Refill  . aspirin EC 81 MG tablet Take 81 mg by mouth daily.    . rosuvastatin (CRESTOR) 10 MG tablet Take 1 tablet (10 mg total) by mouth daily. 90 tablet 3   No current facility-administered medications for this visit.    Social History   Socioeconomic History  . Marital status: Married    Spouse name: Not on file  . Number of children: Not on file  . Years of education: Not on file  . Highest education level: Not on file  Occupational History  .  Not on file  Tobacco Use  . Smoking status: Never Smoker  . Smokeless tobacco: Never Used  . Tobacco comment: smoked in college  Vaping Use  . Vaping Use: Never used  Substance and Sexual Activity  . Alcohol use: No    Comment: occ   . Drug use: No  . Sexual activity: Yes  Other Topics Concern  . Not on file  Social History Narrative   Pt lives in [place] with spouse.  Retired Scientist, physiological.   Social Determinants of Health   Financial Resource Strain:   . Difficulty of Paying Living Expenses:   Food Insecurity:   . Worried About Charity fundraiser in the Last Year:   . Arboriculturist in the Last Year:   Transportation Needs:   . Film/video editor (Medical):   Marland Kitchen Lack of Transportation (Non-Medical):   Physical Activity:   . Days of Exercise per Week:   . Minutes of Exercise per Session:   Stress:   . Feeling of Stress :   Social Connections:   . Frequency of Communication with Friends and Family:   . Frequency of Social Gatherings with Friends and Family:   . Attends Religious Services:   . Active  Member of Clubs or Organizations:   . Attends Archivist Meetings:   Marland Kitchen Marital Status:   Intimate Partner Violence:   . Fear of Current or Ex-Partner:   . Emotionally Abused:   Marland Kitchen Physically Abused:   . Sexually Abused:     Family History  Problem Relation Age of Onset  . Stroke Mother   . Hypertension Mother   . Cancer Father     ROS General: Negative; No fevers, chills, or night sweats HEENT: Negative; No changes in vision or hearing, sinus congestion, difficulty swallowing Pulmonary: Negative; No cough, wheezing, shortness of breath, hemoptysis Cardiovascular: See HPI:  GI: Negative; No nausea, vomiting, diarrhea, or abdominal pain GU: Negative; No dysuria, hematuria, or difficulty voiding Musculoskeletal: Negative; no myalgias, joint pain, or weakness Hematologic: Negative; no easy bruising, bleeding Endocrine: Negative; no heat/cold intolerance; no diabetes, Neuro: Negative; no changes in balance, headaches Skin: Negative; No rashes or skin lesions Psychiatric: Negative; No behavioral problems, depression Sleep: Negative; No snoring,  daytime sleepiness, hypersomnolence, bruxism, restless legs, hypnogognic hallucinations. Other comprehensive 14 point system review is negative   Physical Exam BP (!) 134/76   Pulse 55   Temp 98.1 F (36.7 C)   Ht 6' 1" (1.854 m)   Wt 188 lb (85.3 kg)   SpO2 98%   BMI 24.80 kg/m    Repeat blood pressure by me 118/72  Wt Readings from Last 3 Encounters:  10/14/19 188 lb (85.3 kg)  04/11/19 190 lb (86.2 kg)  09/02/18 177 lb (80.3 kg)   General: Alert, oriented, no distress.  Skin: normal turgor, no rashes, warm and dry HEENT: Normocephalic, atraumatic. Pupils equal round and reactive to light; sclera anicteric; extraocular muscles intact; Fundi ** Nose without nasal septal hypertrophy Mouth/Parynx benign; Mallinpatti scale 3 Neck: No JVD, no carotid bruits; normal carotid upstroke Lungs: clear to ausculatation and  percussion; no wheezing or rales Chest wall: without tenderness to palpitation Heart: PMI not displaced, RRR, s1 s2 normal, 5-0/5 systolic murmur in the aortic area compatible with his bioprosthetic aortic valve replacement;  no diastolic murmur, no rubs, gallops, thrills, or heaves Abdomen: soft, nontender; no hepatosplenomehaly, BS+; abdominal aorta nontender and not dilated by palpation. Back: no CVA tenderness  Pulses 2+ Musculoskeletal: full range of motion, normal strength, no joint deformities Extremities: no clubbing cyanosis or edema, Homan's sign negative  Neurologic: grossly nonfocal; Cranial nerves grossly wnl Psychologic: Normal mood and affect   ECG (independently read by me): Sinus bradycardia 55 bpm, first-degree AV block with a PR of 218 ms.  PACs.  R wave progression, unchanged.  T wave abnormality inferiorly.  January 2021 ECG (independently read by me): Sinus rhythm with an isolated PVC and occasional PACs.  December 2019 ECG (independently read by me): Sinus bradycardia 54 bpm.  First-degree AV block with a PR interval at 234 ms.  Poor anterior R wave progression  May 2019 ECG (independently read by me): Sinus bradycardia at 57 bpm.  First-degree AV block with a PR interval at 232 ms.  PAC.  QS complex.  November 2018 ECG (independently read by me): Sinus bradycardia 54 bpm with sinus arrhythmia.  First degree AV block with a PR interval at 214 ms.  PAC.  Poor anterior R-wave progression V1 through V4.  March 2018 ECG (independently read by me): Sinus bradycardia with first degree AV block.  PAC is in an atrial bigeminal rhythm.    September 2017 ECG (independently read by me): Sinus bradycardia 53 bpm.  First-degree AV block.  There are PACs and atrial bigeminal pattern.  He has poor anterior R-wave voltage.  January 2017 ECG (independently read by me): Sinus bradycardia 50 bpm with first-degree AV block with a PR interval at 234 ms and evidence for occasional  PAC.  September 2016 ECG (independently read by me): Atrial flutter with 4:1 block at 64 bpm.  09/11/2014 ECG (independently read by me): Atrial flutter with 41 block.  One isolated PVC.  Ventricular rate 67 bpm.  Nonspecific ST-T change  Prior ECG (independently read by me): Underlying atrial flutter with a ventricular rate at 88 with frequent PVCs in a bigeminal pattern transiently.  LABS:  BMP Latest Ref Rng & Units 10/14/2019 04/11/2019 02/08/2018  Glucose 65 - 99 mg/dL 96 80 99  BUN 8 - 27 mg/dL _0 Creatinine 0.76 - 1.27 mg/dL 1.15 0.96 1.18  BUN/Creat Ratio 10 - 24 9(L) 14 10  Sodium 134 - 144 mmol/L 145(H) 146(H) 142  Potassium 3.5 - 5.2 mmol/L 5.7(H) 4.7 5.0  Chloride 96 - 106 mmol/L 100 106 103  CO2 20 - 29 mmol/L _1 Calcium 8.6 - 10.2 mg/dL 9.7 9.3 9.6    Hepatic Function Latest Ref Rng & Units 10/14/2019 04/11/2019 02/08/2018  Total Protein 6.0 - 8.5 g/dL 7.8 7.7 7.2  Albumin 3.6 - 4.6 g/dL 4.6 4.6 4.4  AST 0 - 40 IU/L _2 ALT 0 - 44 IU/L _3 Alk Phosphatase 48 - 121 IU/L 84 80 84  Total Bilirubin 0.0 - 1.2 mg/dL 1.0 1.1 0.8    CBC Latest Ref Rng & Units 10/14/2019 04/11/2019 07/20/2017  WBC 3.4 - 10.8 x10E3/uL 7.0 6.7 6.0  Hemoglobin 13.0 - 17.7 g/dL 15.5 14.2 14.1  Hematocrit 37.5 - 51.0 % 43.6 40.9 40.5  Platelets 150 - 450 x10E3/uL 215 209 240   Lab Results  Component Value Date   MCV 101 (H) 10/14/2019   MCV 100 (H) 04/11/2019   MCV 102 (H) 07/20/2017    Lab Results  Component Value Date   TSH 1.210 04/11/2019    BNP    Component Value Date/Time   BNP 1,282.9 (H) 06/19/2014 1630    ProBNP  No results found for: PROBNP   Lipid Panel     Component Value Date/Time   CHOL 138 10/14/2019 1204   TRIG 95 10/14/2019 1204   HDL 36 (L) 10/14/2019 1204   CHOLHDL 3.8 10/14/2019 1204   CHOLHDL 4.8 06/11/2016 1120   VLDL 19 06/11/2016 1120   LDLCALC 84 10/14/2019 1204     RADIOLOGY: No results found.  IMPRESSION:  1. S/P  aortic valve replacement with bioprosthetic valve   2. Typical atrial flutter [facility]): Status post ablation December 19, 2014, Dr. [name]   3. Hyperlipidemia, unspecified hyperlipidemia type   4. Premature atrial contractions   5. First degree AV block    ASSESSMENT AND PLAN: Erik [name] is a young appearing 84 year-old gentleman who was found to have severe symptomatic critical aortic valve stenosis after he presented to the hospital following a presyncopal spell in 2016.  He successfully underwent aortic valve replacement surgery on 06/30/2014 with a 25 mm [facility] Ease bovine pericardial tissue valve.  He developed atrial flutter in October 2016 and underwent successful ablation.  He was taken off warfarin therapy as well as beta blocker therapy.  He continues to be on aspirin alone.  In the past he has been demonstrated to have an atrial bigeminal rhythm and some bradycardia.  I again reviewed his last echo Doppler study from June 2020 which showed normal systolic function with EF at 55 to 60%, mild biatrial enlargement.  His bovine pericardial tissue valve was well-seated and functioning normally with a minimal gradient of 5 and peak gradient of 14.  [place]  He remains on rosuvastatin 10 mg.  His blood pressure today is stable.  He has first-degree AV block.  He has noticed a very rare palpitation, and PAC was detected on his ECG.  A complete set of laboratory will be checked today.  In 6 months I am recommending he undergo an 72-monthfollow-up echo Doppler study and I will see him back in the office in follow-up echo evaluation for further assessment.   TTroy Sine MD, FOphthalmology Surgery [facility] 10/19/2019 5:18 PM

## 2019-10-14 NOTE — Patient Instructions (Signed)
Medication Instructions:  CONTINUE WITH CURRENT MEDICATIONS. NO CHANGES.  *If you need a refill on your cardiac medications before your next appointment, please call your pharmacy*   Lab Work: FASTING LABS TODAY: CMET CBC LIPID If you have labs (blood work) drawn today and your tests are completely normal, you will receive your results only by: Marland Kitchen MyChart Message (if you have MyChart) OR . A paper copy in the mail If you have any lab test that is abnormal or we need to change your treatment, we will call you to review the results.   Testing/Procedures: Your physician has requested that you have an echocardiogram. Echocardiography is a painless test that uses sound waves to create images of your heart. It provides your doctor with information about the size and shape of your heart and how well your heart's chambers and valves are working. This procedure takes approximately one hour. There are no restrictions for this procedure.     Follow-Up: At [facility] you and your health needs are our priority.  As part of our continuing mission to provide you with exceptional heart care, we have created designated Provider Care Teams.  These Care Teams include your primary Cardiologist (physician) and Advanced Practice Providers (APPs -  Physician Assistants and Nurse Practitioners) who all work together to provide you with the care you need, when you need it.  We recommend signing up for the patient portal called "MyChart".  Sign up information is provided on this After Visit Summary.  MyChart is used to connect with patients for Virtual Visits (Telemedicine).  Patients are able to view lab/test results, encounter notes, upcoming appointments, etc.  Non-urgent messages can be sent to your provider as well.   To learn more about what you can do with MyChart, go to [email or web address].    Your next appointment:   6 month(s)  The format for your next appointment:   In Person  Provider:    Shelva Majestic, MD

## 2019-10-15 LAB — CBC
Hematocrit: 43.6 % (ref 37.5–51.0)
Hemoglobin: 15.5 g/dL (ref 13.0–17.7)
MCH: 35.7 pg — ABNORMAL HIGH (ref 26.6–33.0)
MCHC: 35.6 g/dL (ref 31.5–35.7)
MCV: 101 fL — ABNORMAL HIGH (ref 79–97)
Platelets: 215 10*3/uL (ref 150–450)
RBC: 4.34 x10E6/uL (ref 4.14–5.80)
RDW: 12.3 % (ref 11.6–15.4)
WBC: 7 10*3/uL (ref 3.4–10.8)

## 2019-10-15 LAB — COMPREHENSIVE METABOLIC PANEL
ALT: 12 IU/L (ref 0–44)
AST: 16 IU/L (ref 0–40)
Albumin/Globulin Ratio: 1.4 (ref 1.2–2.2)
Albumin: 4.6 g/dL (ref 3.6–4.6)
Alkaline Phosphatase: 84 IU/L (ref 48–121)
BUN/Creatinine Ratio: 9 — ABNORMAL LOW (ref 10–24)
BUN: 10 mg/dL (ref 8–27)
Bilirubin Total: 1 mg/dL (ref 0.0–1.2)
CO2: 25 mmol/L (ref 20–29)
Calcium: 9.7 mg/dL (ref 8.6–10.2)
Chloride: 100 mmol/L (ref 96–106)
Creatinine, Ser: 1.15 mg/dL (ref 0.76–1.27)
GFR calc Af Amer: 69 mL/min/{1.73_m2} (ref >59)
GFR calc non Af Amer: 59 mL/min/{1.73_m2} — ABNORMAL LOW (ref >59)
Globulin, Total: 3.2 g/dL (ref 1.5–4.5)
Glucose: 96 mg/dL (ref 65–99)
Potassium: 5.7 mmol/L — ABNORMAL HIGH (ref 3.5–5.2)
Sodium: 145 mmol/L — ABNORMAL HIGH (ref 134–144)
Total Protein: 7.8 g/dL (ref 6.0–8.5)

## 2019-10-15 LAB — LIPID PANEL
Chol/HDL Ratio: 3.8 ratio (ref 0.0–5.0)
Cholesterol, Total: 138 mg/dL (ref 100–199)
HDL: 36 mg/dL — ABNORMAL LOW (ref >39)
LDL Chol Calc (NIH): 84 mg/dL (ref 0–99)
Triglycerides: 95 mg/dL (ref 0–149)
VLDL Cholesterol Cal: 18 mg/dL (ref 5–40)

## 2019-10-19 ENCOUNTER — Other Ambulatory Visit: Payer: Self-pay

## 2019-10-19 ENCOUNTER — Encounter: Payer: Self-pay | Admitting: Cardiovascular Disease

## 2019-10-20 MED ORDER — ROSUVASTATIN CALCIUM 10 MG PO TABS: 10.0000 mg | ORAL_TABLET | Freq: Every day | ORAL | 3 refills | Status: DC

## 2019-10-26 ENCOUNTER — Telehealth: Payer: Self-pay | Admitting: Cardiovascular Disease

## 2019-10-26 NOTE — Telephone Encounter (Signed)
    Pt's wife returning call from [place] for lab results

## 2019-10-26 NOTE — Addendum Note (Signed)
Addended by: Valeta Harms on: 10/26/2019 01:42 PM   Modules accepted: Orders

## 2019-10-26 NOTE — Telephone Encounter (Signed)
Called and spoke with pt's wife per DPR she states that 3 days prior to his lab work he had been working outside a lot a sweating but he didn't drink any water and thought that maybe he had been dehydrated prior to these labs. Reviewed Dr.Kelly's recommendations. Wife says they will go to the [facility] in [place] tomorrow morning. Wife would like [place] to send the lab slips to the [facility] prior to them going.  No other questions at this time. Will contact the [facility] and fax lab slip

## 2019-10-26 NOTE — Telephone Encounter (Signed)
Patient returning call.

## 2019-10-28 LAB — COMPREHENSIVE METABOLIC PANEL
ALT: 12 IU/L (ref 0–44)
AST: 14 IU/L (ref 0–40)
Albumin/Globulin Ratio: 1.7 (ref 1.2–2.2)
Albumin: 4.3 g/dL (ref 3.6–4.6)
Alkaline Phosphatase: 77 IU/L (ref 48–121)
BUN/Creatinine Ratio: 12 (ref 10–24)
BUN: 13 mg/dL (ref 8–27)
Bilirubin Total: 0.5 mg/dL (ref 0.0–1.2)
CO2: 22 mmol/L (ref 20–29)
Calcium: 9.1 mg/dL (ref 8.6–10.2)
Chloride: 108 mmol/L — ABNORMAL HIGH (ref 96–106)
Creatinine, Ser: 1.1 mg/dL (ref 0.76–1.27)
GFR calc Af Amer: 72 mL/min/{1.73_m2} (ref >59)
GFR calc non Af Amer: 63 mL/min/{1.73_m2} (ref >59)
Globulin, Total: 2.5 g/dL (ref 1.5–4.5)
Glucose: 85 mg/dL (ref 65–99)
Potassium: 4.6 mmol/L (ref 3.5–5.2)
Sodium: 144 mmol/L (ref 134–144)
Total Protein: 6.8 g/dL (ref 6.0–8.5)

## 2019-11-02 ENCOUNTER — Other Ambulatory Visit (HOSPITAL_COMMUNITY): Payer: Medicare Other

## 2020-03-28 ENCOUNTER — Other Ambulatory Visit (HOSPITAL_COMMUNITY): Payer: Medicare Other

## 2020-04-09 ENCOUNTER — Ambulatory Visit (INDEPENDENT_AMBULATORY_CARE_PROVIDER_SITE_OTHER): Payer: Medicare Other | Admitting: Cardiovascular Disease

## 2020-04-09 ENCOUNTER — Encounter: Payer: Self-pay | Admitting: Cardiovascular Disease

## 2020-04-09 ENCOUNTER — Other Ambulatory Visit: Payer: Self-pay

## 2020-04-09 ENCOUNTER — Ambulatory Visit (HOSPITAL_COMMUNITY): Payer: Medicare Other | Attending: Cardiology

## 2020-04-09 DIAGNOSIS — I440 Atrioventricular block, first degree: Secondary | ICD-10-CM

## 2020-04-09 DIAGNOSIS — Z953 Presence of xenogenic heart valve: Secondary | ICD-10-CM | POA: Diagnosis not present

## 2020-04-09 DIAGNOSIS — I491 Atrial premature depolarization: Secondary | ICD-10-CM | POA: Diagnosis not present

## 2020-04-09 DIAGNOSIS — E785 Hyperlipidemia, unspecified: Secondary | ICD-10-CM | POA: Insufficient documentation

## 2020-04-09 DIAGNOSIS — I483 Typical atrial flutter: Secondary | ICD-10-CM | POA: Insufficient documentation

## 2020-04-09 DIAGNOSIS — Z79899 Other long term (current) drug therapy: Secondary | ICD-10-CM | POA: Diagnosis not present

## 2020-04-09 LAB — ECHOCARDIOGRAM COMPLETE
AV Mean grad: 8 mmHg
AV Peak grad: 13.4 mmHg
Ao pk vel: 1.83 m/s
Area-P 1/2: 2.56 cm2
S' Lateral: 2.6 cm

## 2020-04-09 NOTE — Patient Instructions (Signed)
Medication Instructions:  Start taking Crestor every other day- and then increase to daily   *If you need a refill on your cardiac medications before your next appointment, please call your pharmacy*  Follow-Up: At [facility], you and your health needs are our priority.  As part of our continuing mission to provide you with exceptional heart care, we have created designated Provider Care Teams.  These Care Teams include your primary Cardiologist (physician) and Advanced Practice Providers (APPs -  Physician Assistants and Nurse Practitioners) who all work together to provide you with the care you need, when you need it.  We recommend signing up for the patient portal called "MyChart".  Sign up information is provided on this After Visit Summary.  MyChart is used to connect with patients for Virtual Visits (Telemedicine).  Patients are able to view lab/test results, encounter notes, upcoming appointments, etc.  Non-urgent messages can be sent to your provider as well.   To learn more about what you can do with MyChart, go to [email or web address].    Your next appointment:   6 month(s)  The format for your next appointment:   In Person  Provider:   Shelva Majestic, MD

## 2020-04-09 NOTE — Progress Notes (Signed)
Patient ID: Erik [name], male   DOB: [birth year], 85 y.o.   MRN: [id number]    PCP: Dr. Pearson Grippe  HPI: Erik [name] is a 85 y.o. male who presents to the office today for a 6  month follow up cardiology evaluation.  I initially saw Erik [name] in cardiology consultation in April 2016 while he was in the hospital after he experienced a brief episode of chest discomfort associated with presyncope and transient visual blurring.  Physical examination suggested a murmur of severe aortic stenosis.  This was verified by echo Doppler data, which suggested critical AS.  I performed a right and left heart catheterization 06/22/2014.  He was found to have critical aortic valve stenosis without significant coronary obstructive disease with only a smooth 20% luminal narrowing of the proximal RCA and moderate global LV dysfunction with an EF of 35-40%.  He had upper normal right heart pressures.  He underwent aortic valve replacement surgery by Dr. Cornelius Moras using a bioprosthetic tissue valve on 06/30/2014.  During his hospitalization, he was taken off all beta blockers because of bradycardia.  Ultimately, beta blocker was reinstituted.  He underwent cardioversion on 10/09/2014 after being on Coumadin anticoagulation for over a month.  He was seen on 10/25/2014 by Huey Bienenstock, which time he was found to be back in atrial flutter.  He subsequent was referred to Dr. Johney Frame who saw him on 11/29/2014.  He underwent successful atrial flutter ablation by Dr. Johney Frame on 12/19/2014.  Subsequently, he was taken off essentially all medications with the exception of aspirin.  He is not aware of any recurrent atrial flutter or atrial fibrillation.    A follow-up echo Doppler study in 06/25/2015 showed an ejection fraction of 60-65% with normal wall motion and diastolic function.  His aortic valve bioprosthesis was well-seated.  There was no stenosis.  The mean gradient was a peak gradient 15 concordant with the valve.  His right atrium was  mild moderately dilated.  He has seen Dr. Cornelius Moras for follow-up evaluation who felt he was stable from his surgical standpoint.  Erik [name] continues to feel well.  When I last saw him, he had an atrial bigeminal pattern.  He has had issues in the past with bradycardia. He is not on any medication except ASA 81 mg and denies any recurrent awareness of arrhythmia.  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  He denies chest pain, PND, orthopnea.  He denies presyncope or syncope.  A one-year follow-up echo Doppler study on 05/20/2016 revealed an ejection fraction at  50-55%.  His bioprosthetic aortic valve was well-seated.  There was no aortic regurgitation or perivalvular leak.  Mean gradient was 7 and peak gradient 12.  There was trivial TR and PR and his left atrium was mildly dilated.   When I saw him in November 2018 he denied any chest pain or shortness of breath, dizziness or palpitations. At that time he was asymptomatic and denied chest pain, shortness of breath, dizziness, or palpitations. He had been fixing up his house in anticipation of selling his house and ultimately moving to [facility]. Lab work which showed his LDL cholesterol had risen to 113.  Is only been taking a baby aspirin.  TSH was normal at 2.42.   When I saw him in May 2019 he remained stable.  He was found to have macrocytosis on his CBC with an MCV of 102.  Subsequent B12 and folate levels were normal.  He remains very active on his land.  He denies chest pain PND orthopnea, presyncope or syncope or palpitations.    I saw him in December 2019  and last saw him in a telemedicine visit on September 02, 2018.  At that time he continued to be active.  [place]  He walks outside, does yardwork, mowing, and keeps himself very busy.  He is asymptomatic and denies chest pain PND orthopnea palpitations or any shortness of breath.  He underwent a follow-up echo Doppler study on  August 24, 2018.  This continued to show normal systolic function as well as normal diastolic function.  There was abnormal septal motion consistent with postoperative state.  There was mild biatrial enlargement.  His 25 mm Edwards bovine bioprosthesis valve was well-seated and functioning normally. The mean gradient was 5 with a peak gradient of 14, normal for the valve.   I saw him in January 2021.  [place] there had not been many people there and he was staying safe with reference to COVID-19.  He denied palpitations, presyncope or syncope.  I last saw him in July 2021.  That time he remained asymptomatic living at [facility].  At times he experienced  a rare palpitation, isolated.  He denied presyncope or syncope.  He has not had recent laboratory.  He remained active.     He underwent laboratory in August 2021 which was stable.  Potassium had normalized from an increased value in July.  I recommended he undergo an echo Doppler study for further evaluation of his AVR.  Since I last saw him, he has remained stable.  He continues to be active and lives full-time at [facility].  He will be establishing primary care with Dr. Lynetta Mare a new physician who joined [facility] health at [facility].  Erik [name] underwent his echo Doppler study this morning.  This showed normal LV function with EF at 60 to 65% with moderate concentric LVH.  He had normal diastolic parameters.  His Edwards SAPIEN valve was well-seated with an 8 mm mean gradient and peak gradient of 13.4.  He denies chest pain PND orthopnea, palpitations, or shortness of breath.  He presents for evaluation.     Past Medical History:  Diagnosis Date  . Aortic stenosis, severe   . Atrial flutter, post-operative 07/27/2014   typical   . Chronic diastolic congestive heart failure (HCC)   . S/P aortic valve replacement with bioprosthetic valve 06/30/2014   25 mm  [facility] Ease bovine pericardial tissue valve  . Sinus bradycardia     Past Surgical History:  Procedure Laterality Date  . AORTIC VALVE REPLACEMENT N/A 06/30/2014   Procedure: AORTIC VALVE REPLACEMENT (AVR);  Surgeon: Purcell Nails, MD;  Location[facility];  Service: Open Heart Surgery;  Laterality: N/A;  . APPENDECTOMY    . CARDIOVERSION N/A 10/09/2014   Procedure: CARDIOVERSION;  Surgeon: Lennette Bihari, MD;  Location: [facility] ENDOSCOPY;  Service: Cardiovascular;  Laterality: N/A;  . CATARACT EXTRACTION Bilateral ? 2013    & 2014  . ELECTROPHYSIOLOGIC STUDY N/A 12/19/2014   CTI ablation by Dr Johney Frame  . HAND SURGERY    . INTRAOPERATIVE TRANSESOPHAGEAL ECHOCARDIOGRAM N/A 06/30/2014   Procedure: INTRAOPERATIVE TRANSESOPHAGEAL ECHOCARDIOGRAM;  Surgeon: Purcell Nails, MD;  Location: [facility] OR;  Service: Open Heart Surgery;  Laterality: N/A;  . LEFT AND RIGHT  HEART CATHETERIZATION WITH CORONARY ANGIOGRAM N/A 06/22/2014   Procedure: LEFT AND RIGHT HEART CATHETERIZATION WITH CORONARY ANGIOGRAM;  Surgeon: Lennette Bihari, MD;  Location: [facility] CATH LAB;  Service: Cardiovascular;  Laterality: N/A;    No Known Allergies  Current Outpatient Medications  Medication Sig Dispense Refill  . aspirin EC 81 MG tablet Take 81 mg by mouth daily.    . rosuvastatin (CRESTOR) 10 MG tablet Take 1 tablet (10 mg total) by mouth daily. 90 tablet 3   No current facility-administered medications for this visit.    Social History   Socioeconomic History  . Marital status: Married    Spouse name: Not on file  . Number of children: Not on file  . Years of education: Not on file  . Highest education level: Not on file  Occupational History  . Not on file  Tobacco Use  . Smoking status: Never Smoker  . Smokeless tobacco: Never Used  . Tobacco comment: smoked in college  Vaping Use  . Vaping Use: Never used  Substance and Sexual Activity  . Alcohol use: No    Comment: occ   . Drug use: No  . Sexual activity: Yes   Other Topics Concern  . Not on file  Social History Narrative   Pt lives in [place] with spouse.  Retired Conservation officer, historic buildings.   Social Determinants of Health   Financial Resource Strain: Not on file  Food Insecurity: Not on file  Transportation Needs: Not on file  Physical Activity: Not on file  Stress: Not on file  Social Connections: Not on file  Intimate Partner Violence: Not on file    Family History  Problem Relation Age of Onset  . Stroke Mother   . Hypertension Mother   . Cancer Father     ROS General: Negative; No fevers, chills, or night sweats HEENT: Negative; No changes in vision or hearing, sinus congestion, difficulty swallowing Pulmonary: Negative; No cough, wheezing, shortness of breath, hemoptysis Cardiovascular: See HPI:  GI: Negative; No nausea, vomiting, diarrhea, or abdominal pain GU: Negative; No dysuria, hematuria, or difficulty voiding Musculoskeletal: Negative; no myalgias, joint pain, or weakness Hematologic: Negative; no easy bruising, bleeding Endocrine: Negative; no heat/cold intolerance; no diabetes, Neuro: Negative; no changes in balance, headaches Skin: Negative; No rashes or skin lesions Psychiatric: Negative; No behavioral problems, depression Sleep: Negative; No snoring,  daytime sleepiness, hypersomnolence, bruxism, restless legs, hypnogognic hallucinations. Other comprehensive 14 point system review is negative   Physical Exam BP 140/70 (BP Location: Right Arm, Patient Position: Sitting)   Pulse 63   Ht 6\' 1"  (1.854 m)   Wt 189 lb (85.7 kg)   SpO2 99%   BMI 24.94 kg/m    Repeat blood pressure by me was 130/74  Wt Readings from Last 3 Encounters:  04/09/20 189 lb (85.7 kg)  10/14/19 188 lb (85.3 kg)  04/11/19 190 lb (86.2 kg)    BP 140/70 (BP Location: Right Arm, Patient Position: Sitting)   Pulse 63   Ht 6\' 1"  (1.854 m)   Wt 189 lb (85.7 kg)   SpO2 99%   BMI 24.94 kg/m  General: Alert, oriented, no distress.   Skin: normal turgor, no rashes, warm and dry HEENT: Normocephalic, atraumatic. Pupils equal round and reactive to light; sclera anicteric; extraocular muscles intact;  Nose without nasal septal hypertrophy Mouth/Parynx benign; Mallinpatti scale 3 Neck: No JVD, no carotid bruits; normal carotid upstroke Lungs: clear to ausculatation and percussion; no wheezing or rales Chest wall:  without tenderness to palpitation Heart: PMI not displaced, RRR, s1 s2 normal, 1-2/6 systolic murmur, no diastolic murmur, no rubs, gallops, thrills, or heaves Abdomen: soft, nontender; no hepatosplenomehaly, BS+; abdominal aorta nontender and not dilated by palpation. Back: no CVA tenderness Pulses 2+ Musculoskeletal: full range of motion, normal strength, no joint deformities Extremities: no clubbing cyanosis or edema, Homan's sign negative  Neurologic: grossly nonfocal; Cranial nerves grossly wnl Psychologic: Normal mood and affect   ECG (independently read by me): Sinus rhythm with !st degree AV block; PRWP  July 2021 ECG (independently read by me): Sinus bradycardia 55 bpm, first-degree AV block with a PR of 218 ms.  PACs.  R wave progression, unchanged.  T wave abnormality inferiorly.  January 2021 ECG (independently read by me): Sinus rhythm with an isolated PVC and occasional PACs.  December 2019 ECG (independently read by me): Sinus bradycardia 54 bpm.  First-degree AV block with a PR interval at 234 ms.  Poor anterior R wave progression  May 2019 ECG (independently read by me): Sinus bradycardia at 57 bpm.  First-degree AV block with a PR interval at 232 ms.  PAC.  QS complex.  November 2018 ECG (independently read by me): Sinus bradycardia 54 bpm with sinus arrhythmia.  First degree AV block with a PR interval at 214 ms.  PAC.  Poor anterior R-wave progression V1 through V4.  March 2018 ECG (independently read by me): Sinus bradycardia with first degree AV block.  PAC is in an atrial bigeminal  rhythm.    September 2017 ECG (independently read by me): Sinus bradycardia 53 bpm.  First-degree AV block.  There are PACs and atrial bigeminal pattern.  He has poor anterior R-wave voltage.  January 2017 ECG (independently read by me): Sinus bradycardia 50 bpm with first-degree AV block with a PR interval at 234 ms and evidence for occasional PAC.  September 2016 ECG (independently read by me): Atrial flutter with 4:1 block at 64 bpm.  09/11/2014 ECG (independently read by me): Atrial flutter with 41 block.  One isolated PVC.  Ventricular rate 67 bpm.  Nonspecific ST-T change  Prior ECG (independently read by me): Underlying atrial flutter with a ventricular rate at 88 with frequent PVCs in a bigeminal pattern transiently.  LABS:  BMP Latest Ref Rng & Units 10/27/2019 10/14/2019 04/11/2019  Glucose 65 - 99 mg/dL 85 96 80  BUN 8 - 27 mg/dL 13 10 13   Creatinine 0.76 - 1.27 mg/dL 1.02 7.25 3.66  BUN/Creat Ratio 10 - 24 12 9(L) 14  Sodium 134 - 144 mmol/L 144 145(H) 146(H)  Potassium 3.5 - 5.2 mmol/L 4.6 5.7(H) 4.7  Chloride 96 - 106 mmol/L 108(H) 100 106  CO2 20 - 29 mmol/L 22 25 23   Calcium 8.6 - 10.2 mg/dL 9.1 9.7 9.3    Hepatic Function Latest Ref Rng & Units 10/27/2019 10/14/2019 04/11/2019  Total Protein 6.0 - 8.5 g/dL 6.8 7.8 7.7  Albumin 3.6 - 4.6 g/dL 4.3 4.6 4.6  AST 0 - 40 IU/L 14 16 21   ALT 0 - 44 IU/L 12 12 14   Alk Phosphatase 48 - 121 IU/L 77 84 80  Total Bilirubin 0.0 - 1.2 mg/dL 0.5 1.0 1.1    CBC Latest Ref Rng & Units 10/14/2019 04/11/2019 07/20/2017  WBC 3.4 - 10.8 x10E3/uL 7.0 6.7 6.0  Hemoglobin 13.0 - 17.7 g/dL 44.0 34.7 42.5  Hematocrit 37.5 - 51.0 % 43.6 40.9 40.5  Platelets 150 - 450 x10E3/uL 215 209 240   Lab  Results  Component Value Date   MCV 101 (H) 10/14/2019   MCV 100 (H) 04/11/2019   MCV 102 (H) 07/20/2017    Lab Results  Component Value Date   TSH 1.210 04/11/2019    BNP    Component Value Date/Time   BNP 1,282.9 (H) 06/19/2014 1630     ProBNP No results found for: PROBNP   Lipid Panel     Component Value Date/Time   CHOL 138 10/14/2019 1204   TRIG 95 10/14/2019 1204   HDL 36 (L) 10/14/2019 1204   CHOLHDL 3.8 10/14/2019 1204   CHOLHDL 4.8 06/11/2016 1120   VLDL 19 06/11/2016 1120   LDLCALC 84 10/14/2019 1204     RADIOLOGY: ECHOCARDIOGRAM COMPLETE  Result Date: 04/09/2020    ECHOCARDIOGRAM REPORT   Patient Name:   TYKIM ALARCON  Date of Exam: 04/09/2020 Medical Rec #:  [id number]     Height:       73.0 in Accession #:    [id number]    Weight:       188.0 lb Date of Birth:  31-Aug-1935     BSA:          2.096 m Patient Age:    84 years      BP:           134/76 mmHg Patient Gender: M             HR:           61 bpm. Exam Location:  [address] Procedure: 2D Echo, Cardiac Doppler and Color Doppler Indications:    Z95.3 Status post Aortic valve replacement  History:        Patient has prior history of Echocardiogram examinations, most                 recent 08/24/2018. Aortic valve replacement-25 mm [employer]. Atrial flutter. Bradycardia.                 Aortic Valve: 25 mm Edwards-SAPIEN valve is present in the                 aortic position.  Sonographer:    Sedonia Small Rodgers-Jones RDCS Referring Phys: [id number] Monzerrat Wellen A Daishaun Ayre IMPRESSIONS  1. Left ventricular ejection fraction, by estimation, is 60 to 65%. The left ventricle has normal function. The left ventricle has no regional wall motion abnormalities. There is moderate concentric left ventricular hypertrophy. Left ventricular diastolic parameters were normal.  2. Right ventricular systolic function is normal. The right ventricular size is normal.  3. The mitral valve is normal in structure. Mild mitral valve regurgitation. No evidence of mitral stenosis.  4. The aortic valve is normal in structure. Aortic valve regurgitation is not visualized. No aortic stenosis is present. There is a 25 mm Edwards-SAPIEN valve present in the aortic position. Aortic  valve mean gradient measures 8.0 mmHg.  5. The inferior vena cava is normal in size with greater than 50% respiratory variability, suggesting right atrial pressure of 3 mmHg. Comparison(s): No significant change from prior study. FINDINGS  Left Ventricle: Left ventricular ejection fraction, by estimation, is 60 to 65%. The left ventricle has normal function. The left ventricle has no regional wall motion abnormalities. The left ventricular internal cavity size was normal in size. There is  moderate concentric left ventricular hypertrophy. Left ventricular diastolic parameters were normal. Right Ventricle: The right ventricular  size is normal. No increase in right ventricular wall thickness. Right ventricular systolic function is normal. Left Atrium: Left atrial size was normal in size. Right Atrium: Right atrial size was normal in size. Pericardium: There is no evidence of pericardial effusion. Mitral Valve: The mitral valve is normal in structure. Mild mitral valve regurgitation. No evidence of mitral valve stenosis. Tricuspid Valve: The tricuspid valve is normal in structure. Tricuspid valve regurgitation is trivial. No evidence of tricuspid stenosis. Aortic Valve: The aortic valve is normal in structure. Aortic valve regurgitation is not visualized. No aortic stenosis is present. Aortic valve mean gradient measures 8.0 mmHg. Aortic valve peak gradient measures 13.4 mmHg. There is a 25 mm Edwards-SAPIEN valve present in the aortic position. Pulmonic Valve: The pulmonic valve was normal in structure. Pulmonic valve regurgitation is not visualized. No evidence of pulmonic stenosis. Aorta: The aortic root is normal in size and structure. Venous: The inferior vena cava is normal in size with greater than 50% respiratory variability, suggesting right atrial pressure of 3 mmHg. IAS/Shunts: No atrial level shunt detected by color flow Doppler.  LEFT VENTRICLE PLAX 2D LVIDd:         4.10 cm Diastology LVIDs:         2.60  cm LV e' medial:    5.66 cm/s LV PW:         1.30 cm LV E/e' medial:  15.1 LV IVS:        1.30 cm LV e' lateral:   9.46 cm/s                        LV E/e' lateral: 9.0  RIGHT VENTRICLE RV Basal diam:  3.20 cm RV S prime:     11.75 cm/s TAPSE (M-mode): 1.6 cm LEFT ATRIUM             Index       RIGHT ATRIUM           Index LA diam:        3.90 cm 1.86 cm/m  RA Area:     22.90 cm LA Vol (A2C):   61.9 ml 29.54 ml/m RA Volume:   69.00 ml  32.92 ml/m LA Vol (A4C):   54.3 ml 25.91 ml/m LA Biplane Vol: 60.0 ml 28.63 ml/m  AORTIC VALVE AV Vmax:           183.25 cm/s AV Vmean:          124.000 cm/s AV VTI:            0.388 m AV Peak Grad:      13.4 mmHg AV Mean Grad:      8.0 mmHg LVOT Vmax:         75.20 cm/s LVOT Vmean:        50.600 cm/s LVOT VTI:          0.167 m LVOT/AV VTI ratio: 0.43  AORTA Ao Root diam: 3.60 cm Ao Asc diam:  3.30 cm MITRAL VALVE MV Area (PHT): 2.56 cm    SHUNTS MV Decel Time: 296 msec    Systemic VTI: 0.17 m MV E velocity: 85.20 cm/s MV A velocity: 80.50 cm/s MV E/A ratio:  1.06 Tobias Alexander MD Electronically signed by Tobias Alexander MD Signature Date/Time: 04/09/2020/12:57:01 PM    Final     IMPRESSION:  1. S/P aortic valve replacement with bioprosthetic valve   2. First degree AV block   3. Hyperlipidemia with target LDL  less than 70   4. Medication management    ASSESSMENT AND PLAN: Mr. Jeremyah Guglielmo is a young appearing 85 year-old gentleman who was found to have severe symptomatic critical aortic valve stenosis after he presented to the hospital following a presyncopal spell in 2016.  He successfully underwent aortic valve replacement surgery on 06/30/2014 with a 25 mm [facility] Ease bovine pericardial tissue valve.  He developed atrial flutter in October 2016 and underwent successful ablation.  He was taken off warfarin therapy as well as beta blocker therapy.  He continues to be on aspirin alone.  Recently, he continues to be asymptomatic.  He has been taking  rosuvastatin 10 mg on Monday Wednesday and Friday.  Most recent lipid studies in July 2021 showed cholesterol 138, L cholesterol was 84.  He denies any myalgias or arthralgias.  With his LDL at 84, I have suggested he increase rosuvastatin to every other day for 2 to 4 weeks and if tolerated to increase this to daily.  I reviewed his echo Doppler study which was done this morning in our office.  He continues to have normal LV function with EF at 60 to 65%, moderate LVH with normal diastolic parameters.  He has a 25 mm Edwards SAPIEN valve with a mean gradient of 8 which remained stable.  Clinically he is doing well.  His ECG is stable.  Since he will be establishing with a new primary care provider, I have suggested that follow-up laboratory be performed in approximately 4 months.  I will see him in 6 months for follow-up Cardiologic evaluation.  Lennette Bihari, MD, [facility]  04/09/2020 2:03 PM

## 2020-07-18 ENCOUNTER — Telehealth: Payer: Self-pay | Admitting: Cardiovascular Disease

## 2020-07-18 NOTE — Telephone Encounter (Signed)
Patient's wife would like to know if the patient needs to have lab work prior to/during his appointment on 11/12/20 with Dr. Claiborne Billings. Please advise.

## 2020-07-18 NOTE — Telephone Encounter (Signed)
Called and spoke with wife and told her to call back closer to his appointment date. She states that she will need to have lab order for [facility] please because the closest [facility] is "2 hours away". So he will need to have lab drawn at new PCP (I was unable to find this MD in EPIC) Iran Ouch, MD at [place] at [facility] [place], [place] [address] Fax ([place]): [phone] Phone: [phone]

## 2020-11-12 ENCOUNTER — Other Ambulatory Visit: Payer: Self-pay

## 2020-11-12 ENCOUNTER — Encounter: Payer: Self-pay | Admitting: Cardiovascular Disease

## 2020-11-12 ENCOUNTER — Ambulatory Visit (INDEPENDENT_AMBULATORY_CARE_PROVIDER_SITE_OTHER): Payer: Medicare Other | Admitting: Cardiovascular Disease

## 2020-11-12 DIAGNOSIS — E785 Hyperlipidemia, unspecified: Secondary | ICD-10-CM | POA: Diagnosis not present

## 2020-11-12 DIAGNOSIS — I5032 Chronic diastolic (congestive) heart failure: Secondary | ICD-10-CM | POA: Diagnosis not present

## 2020-11-12 DIAGNOSIS — I350 Nonrheumatic aortic (valve) stenosis: Secondary | ICD-10-CM

## 2020-11-12 DIAGNOSIS — I483 Typical atrial flutter: Secondary | ICD-10-CM

## 2020-11-12 DIAGNOSIS — Z953 Presence of xenogenic heart valve: Secondary | ICD-10-CM | POA: Diagnosis not present

## 2020-11-12 MED ORDER — ROSUVASTATIN CALCIUM 10 MG PO TABS: 10.0000 mg | ORAL_TABLET | Freq: Every day | ORAL | 3 refills | Status: DC

## 2020-11-12 NOTE — Progress Notes (Signed)
Patient ID: [name], male   DOB: 10/26/1935, 85 y.o.   MRN: [id number]    PCP: [name]. [name]  HPI: [name] is a 85 y.o. male who presents to the office today for a 7 month follow up cardiology evaluation.  I initially saw [name] in cardiology consultation in April 2016 while he was in [place] [name] after he experienced a brief episode of chest discomfort associated with presyncope and transient visual blurring.  Physical examination suggested a murmur of severe aortic stenosis.  This was verified by echo Doppler data, which suggested critical AS.  I performed a right and left heart catheterization 06/22/2014.  He was found to have critical aortic valve stenosis without significant coronary obstructive disease with only a smooth 20% luminal narrowing of the proximal RCA and moderate global LV dysfunction with an EF of 35-40%.  He had upper normal right heart pressures.  He underwent aortic valve replacement surgery by [name] using a bioprosthetic tissue valve on 06/30/2014.  During his hospitalization, he was taken off all beta blockers because of bradycardia.  Ultimately, beta blocker was reinstituted.  He underwent cardioversion on 10/09/2014 after being on Coumadin anticoagulation for over a month.  He was seen on 10/25/2014 by [name], which time he was found to be back in atrial flutter.  He subsequent was referred to [name] who saw him on 11/29/2014.  He underwent successful atrial flutter ablation by [name] on 12/19/2014.  Subsequently, he was taken off essentially all medications with the exception of aspirin.  He is not aware of any recurrent atrial flutter or atrial fibrillation.    A follow-up echo Doppler study in 06/25/2015 showed an ejection fraction of 60-65% with normal wall motion and diastolic function.  His aortic valve bioprosthesis was well-seated.  There was no stenosis.  The mean gradient was a peak gradient 15 concordant with the valve.  His right atrium was  mild moderately dilated.  He has seen [name] for follow-up evaluation who felt he was stable from his surgical standpoint.  [name] continues to feel well.  When I last saw him, he had an atrial bigeminal pattern.  He has had issues in the past with bradycardia. He is not on any medication except ASA 81 mg and denies any recurrent awareness of arrhythmia.  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  He denies chest pain, PND, orthopnea.  He denies presyncope or syncope.  A one-year follow-up echo Doppler study on 05/20/2016 revealed an ejection fraction at  50-55%.  His bioprosthetic aortic valve was well-seated.  There was no aortic regurgitation or perivalvular leak.  Mean gradient was 7 and peak gradient 12.  There was trivial TR and PR and his left atrium was mildly dilated.   When I saw him in November 2018 he denied any chest pain or shortness of breath, dizziness or palpitations. At that time he was asymptomatic and denied chest pain, shortness of breath, dizziness, or palpitations. He had been fixing up his house in anticipation of selling his house and ultimately moving to [name]. Lab work which showed his LDL cholesterol had risen to 113.  Is only been taking a baby aspirin.  TSH was normal at 2.42.   When I saw him in May 2019 he remained stable.  He was found to have macrocytosis on his CBC with an MCV of 102.  Subsequent B12 and folate levels were normal.  He remains very active on his land.  He denies chest pain PND orthopnea, presyncope or syncope or palpitations.    I saw him in December 2019  and last saw him in a telemedicine visit on September 02, 2018.  At that time he continued to be active.  [place] in the [name] area.  He walks outside, does yardwork, mowing, and keeps himself very busy.  He is asymptomatic and denies chest pain PND orthopnea palpitations or any shortness of breath.  He underwent a follow-up echo Doppler study on  August 24, 2018.  This continued to show normal systolic function as well as normal diastolic function.  There was abnormal septal motion consistent with postoperative state.  There was mild biatrial enlargement.  His 25 mm Edwards bovine bioprosthesis valve was well-seated and functioning normally. The mean gradient was 5 with a peak gradient of 14, normal for the valve.   I saw him in January 2021.  [place] months there had not been many people there and he was staying safe with reference to COVID-19.  He denied palpitations, presyncope or syncope.  I last saw him in July 2021.  That time he remained asymptomatic living at [name].  At times he experienced  a rare palpitation, isolated.  He denied presyncope or syncope.  He has not had recent laboratory.  He remained active.     He underwent laboratory in August 2021 which was stable.  Potassium had normalized from an increased value in July.  I recommended he undergo an echo Doppler study for further evaluation of his AVR.  I last saw him on April 09, 2020.  Since I last saw him, he has remained stable.  He continues to be active and lives full-time at [name].  He will be establishing primary care with [name]. [name] a new physician who joined [name].  A follow-up echo Doppler study on April 09, 2020 showed normal LV function with EF at 60 to 65% with moderate concentric LVH.  He had normal diastolic parameters.  His Edwards SAPIEN valve was well-seated with an 8 mm mean gradient and peak gradient of 13.4.  He denied chest pain PND orthopnea, palpitations, or shortness of breath.    Since I last saw him, he continues to be asymptomatic.  He is now seeing [name]. [name].  He remains free without chest pain or shortness of breath.  He denies palpitations.  He has been taking rosuvastatin 10 mg on Monday  Wednesday Friday and Saturday.  Laboratory in July 2021 showed an LDL cholesterol at 84, triglycerides 95 total cholesterol 138 and HDL 36.  He presents for follow-up evaluation.    Past Medical History:  Diagnosis Date   Aortic stenosis, severe    Atrial flutter, post-operative 07/27/2014   typical    Chronic diastolic congestive heart failure (HCC)    S/P aortic valve replacement with bioprosthetic valve 06/30/2014   25 mm [name] Ease bovine pericardial tissue valve   Sinus bradycardia     Past Surgical History:  Procedure Laterality Date   AORTIC VALVE REPLACEMENT N/A 06/30/2014   Procedure: AORTIC VALVE REPLACEMENT (AVR);  Surgeon: Rexene Alberts, MD;  Location: [place];  Service: Open Heart Surgery;  Laterality: N/A;   APPENDECTOMY     CARDIOVERSION N/A 10/09/2014   Procedure: CARDIOVERSION;  Surgeon: Troy Sine, MD;  Location: [place];  Service: Cardiovascular;  Laterality: N/A;   CATARACT EXTRACTION Bilateral ? 2013    & 2014   ELECTROPHYSIOLOGIC STUDY N/A 12/19/2014   CTI ablation by [name]   HAND SURGERY     INTRAOPERATIVE TRANSESOPHAGEAL ECHOCARDIOGRAM N/A 06/30/2014   Procedure: INTRAOPERATIVE TRANSESOPHAGEAL ECHOCARDIOGRAM;  Surgeon: Rexene Alberts, MD;  Location: [place];  Service: Open Heart Surgery;  Laterality: N/A;   LEFT AND RIGHT HEART CATHETERIZATION WITH CORONARY ANGIOGRAM N/A 06/22/2014   Procedure: LEFT AND RIGHT HEART CATHETERIZATION WITH CORONARY ANGIOGRAM;  Surgeon: Troy Sine, MD;  [name];  Service: Cardiovascular;  Laterality: N/A;    No Known Allergies  Current Outpatient Medications  Medication Sig Dispense Refill   aspirin EC 81 MG tablet Take 81 mg by mouth daily.     rosuvastatin (CRESTOR) 10 MG tablet Take 1 tablet (10 mg total) by mouth daily. 90 tablet 3   No current facility-administered medications for this visit.    Social History   Socioeconomic History   Marital status: Married    Spouse name: Not on  file   Number of children: Not on file   Years of education: Not on file   Highest education level: Not on file  Occupational History   Not on file  Tobacco Use   Smoking status: Never   Smokeless tobacco: Never   Tobacco comments:    smoked in college  Vaping Use   Vaping Use: Never used  Substance and Sexual Activity   Alcohol use: No    Comment: occ    Drug use: No   Sexual activity: Yes  Other Topics Concern   Not on file  Social History Narrative   Pt lives in [place] with spouse.  Retired Scientist, physiological.   Social Determinants of Health   Financial Resource Strain: Not on file  Food Insecurity: Not on file  Transportation Needs: Not on file  Physical Activity: Not on file  Stress: Not on file  Social Connections: Not on file  Intimate Partner Violence: Not on file    Family History  Problem Relation Age of Onset   Stroke Mother    Hypertension Mother    Cancer Father     ROS General: Negative; No fevers, chills, or night sweats HEENT: Negative; No changes in vision or hearing, sinus congestion, difficulty swallowing Pulmonary: Negative; No cough, wheezing, shortness of breath, hemoptysis Cardiovascular: See HPI:  GI: Negative; No nausea, vomiting, diarrhea, or abdominal pain GU: Negative; No dysuria, hematuria, or difficulty voiding Musculoskeletal: Negative; no myalgias, joint pain, or weakness Hematologic: Negative; no easy bruising, bleeding Endocrine: Negative; no heat/cold intolerance; no diabetes, Neuro: Negative; no changes in balance, headaches Skin: Negative; No rashes or skin lesions Psychiatric: Negative; No behavioral problems, depression Sleep: Negative; No snoring,  daytime sleepiness, hypersomnolence, bruxism, restless legs, hypnogognic hallucinations. Other comprehensive 14 point system review is negative   Physical Exam BP 132/78   Pulse (!) 53   Ht $R'6\' 1"'Zi$  (1.854 m)   Wt 189 lb 3.2 oz (85.8 kg)   SpO2 97%   BMI 24.96  kg/m    Repeat blood pressure by me was 120/68  Wt Readings from Last 3 Encounters:  11/12/20 189 lb 3.2 oz (85.8 kg)  04/09/20 189 lb (85.7 kg)  10/14/19 188 lb (85.3 kg)   General: Alert, oriented, no distress.  Skin: normal turgor, no rashes, warm and dry HEENT: Normocephalic, atraumatic. Pupils  equal round and reactive to light; sclera anicteric; extraocular muscles intact;  Nose without nasal septal hypertrophy Mouth/Parynx benign; Mallinpatti scale 3 Neck: No JVD, no carotid bruits; normal carotid upstroke Lungs: clear to ausculatation and percussion; no wheezing or rales Chest wall: without tenderness to palpitation Heart: PMI not displaced, RRR, s1 s2 normal, 2/6 systolic murmur, no diastolic murmur, no rubs, gallops, thrills, or heaves Abdomen: soft, nontender; no hepatosplenomehaly, BS+; abdominal aorta nontender and not dilated by palpation. Back: no CVA tenderness Pulses 2+ Musculoskeletal: full range of motion, normal strength, no joint deformities Extremities: no clubbing cyanosis or edema, Homan's sign negative  Neurologic: grossly nonfocal; Cranial nerves grossly wnl Psychologic: Normal mood and affect   November 12, 2020 ECG (independently read by me): Probable low atrial rhythm at 53,  PAC, low voltage  April 09, 2020 ECG (independently read by me): Sinus rhythm with !st degree AV block; PRWP  July 2021 ECG (independently read by me): Sinus bradycardia 55 bpm, first-degree AV block with a PR of 218 ms.  PACs.  R wave progression, unchanged.  T wave abnormality inferiorly.  January 2021 ECG (independently read by me): Sinus rhythm with an isolated PVC and occasional PACs.  December 2019 ECG (independently read by me): Sinus bradycardia 54 bpm.  First-degree AV block with a PR interval at 234 ms.  Poor anterior R wave progression  May 2019 ECG (independently read by me): Sinus bradycardia at 57 bpm.  First-degree AV block with a PR interval at 232 ms.  PAC.  QS  complex.  November 2018 ECG (independently read by me): Sinus bradycardia 54 bpm with sinus arrhythmia.  First degree AV block with a PR interval at 214 ms.  PAC.  Poor anterior R-wave progression V1 through V4.  March 2018 ECG (independently read by me): Sinus bradycardia with first degree AV block.  PAC is in an atrial bigeminal rhythm.    September 2017 ECG (independently read by me): Sinus bradycardia 53 bpm.  First-degree AV block.  There are PACs and atrial bigeminal pattern.  He has poor anterior R-wave voltage.  January 2017 ECG (independently read by me): Sinus bradycardia 50 bpm with first-degree AV block with a PR interval at 234 ms and evidence for occasional PAC.  September 2016 ECG (independently read by me): Atrial flutter with 4:1 block at 64 bpm.  09/11/2014 ECG (independently read by me): Atrial flutter with 41 block.  One isolated PVC.  Ventricular rate 67 bpm.  Nonspecific ST-T change  Prior ECG (independently read by me): Underlying atrial flutter with a ventricular rate at 88 with frequent PVCs in a bigeminal pattern transiently.  LABS:  BMP Latest Ref Rng & Units 10/27/2019 10/14/2019 04/11/2019  Glucose 65 - 99 mg/dL 85 96 80  BUN 8 - 27 mg/dL $Remove'13 10 13  'ptuXImh$ Creatinine 0.76 - 1.27 mg/dL 1.10 1.15 0.96  BUN/Creat Ratio 10 - 24 12 9(L) 14  Sodium 134 - 144 mmol/L 144 145(H) 146(H)  Potassium 3.5 - 5.2 mmol/L 4.6 5.7(H) 4.7  Chloride 96 - 106 mmol/L 108(H) 100 106  CO2 20 - 29 mmol/L $RemoveB'22 25 23  'MliBDImO$ Calcium 8.6 - 10.2 mg/dL 9.1 9.7 9.3    Hepatic Function Latest Ref Rng & Units 10/27/2019 10/14/2019 04/11/2019  Total Protein 6.0 - 8.5 g/dL 6.8 7.8 7.7  Albumin 3.6 - 4.6 g/dL 4.3 4.6 4.6  AST 0 - 40 IU/L $Remov'14 16 21  'alGYfE$ ALT 0 - 44 IU/L $Remov'12 12 14  'akmUSU$ Alk Phosphatase 48 - 121  IU/L 77 84 80  Total Bilirubin 0.0 - 1.2 mg/dL 0.5 1.0 1.1    CBC Latest Ref Rng & Units 10/14/2019 04/11/2019 07/20/2017  WBC 3.4 - 10.8 x10E3/uL 7.0 6.7 6.0  Hemoglobin 13.0 - 17.7 g/dL 15.5 14.2 14.1  Hematocrit  37.5 - 51.0 % 43.6 40.9 40.5  Platelets 150 - 450 x10E3/uL 215 209 240   Lab Results  Component Value Date   MCV 101 (H) 10/14/2019   MCV 100 (H) 04/11/2019   MCV 102 (H) 07/20/2017    Lab Results  Component Value Date   TSH 1.210 04/11/2019    BNP    Component Value Date/Time   BNP 1,282.9 (H) 06/19/2014 1630    ProBNP No results found for: PROBNP   Lipid Panel     Component Value Date/Time   CHOL 138 10/14/2019 1204   TRIG 95 10/14/2019 1204   HDL 36 (L) 10/14/2019 1204   CHOLHDL 3.8 10/14/2019 1204   CHOLHDL 4.8 06/11/2016 1120   VLDL 19 06/11/2016 1120   LDLCALC 84 10/14/2019 1204     RADIOLOGY: No results found.   IMPRESSION:  1. S/P aortic valve replacement with bioprosthetic valve   2. h/o Aortic stenosis, severe   3. Chronic diastolic congestive heart failure ([place])   4. Hyperlipidemia with target LDL less than 70   5. Typical atrial flutter [facility]): Status post ablation December 19, 2014, [name]. [facility]     ASSESSMENT AND PLAN: Mr. Gavon Majano is a young appearing 85 year-old gentleman who was found to have severe symptomatic critical aortic valve stenosis after he presented to the [name] following a presyncopal spell in 2016.  He successfully underwent aortic valve replacement surgery on 06/30/2014 with a 25 mm [name] Ease bovine pericardial tissue valve.  He developed atrial flutter in October 2016 and underwent successful ablation.  He was taken off warfarin therapy as well as beta blocker therapy.  He continues to be on aspirin alone.  Recently, he continues to be asymptomatic.  He has been taking rosuvastatin 10 mg on Monday Wednesday and Friday.  Lipid studies in July 2021 showed cholesterol 138, L cholesterol was 84.  At that time he denied any myalgias or arthralgias and I suggested he increase rosuvastatin to every other day for 2 to 4 weeks and if tolerated increase to daily.  Apparently he never increased the rosuvastatin to daily and presently  has been taking this on Monday, Wednesday already and Saturday.  Most recent evaluation from November 06, 2020 showed an LDL cholesterol at 79.  I have suggested he now increase this to daily.   He recently [facility] with [name]. [name]  He continues to be asymptomatic.  His 25 mm words sapient valve at last echo had a mean gradient of 8.  His presyncope or syncope or palpitations.  I reviewed a chemistry evaluation that he had had on November 06, 2020 which was normal with creatinine 1.0.  LFTs normal.  Would suggest follow-up lipid study later this year.  As long as he remains stable I will see him in 1 year for follow-up evaluation or sooner as needed.   Troy Sine, MD, [name]  11/14/2020 2:17 PM

## 2020-11-12 NOTE — Patient Instructions (Signed)
Medication Instructions:  Continue current medications  *If you need a refill on your cardiac medications before your next appointment, please call your pharmacy*   Lab Work: None Ordered   Testing/Procedures: None Ordered   Follow-Up: At [facility], you and your health needs are our priority.  As part of our continuing mission to provide you with exceptional heart care, we have created designated Provider Care Teams.  These Care Teams include your primary Cardiologist (physician) and Advanced Practice Providers (APPs -  Physician Assistants and Nurse Practitioners) who all work together to provide you with the care you need, when you need it.  We recommend signing up for the patient portal called "MyChart".  Sign up information is provided on this After Visit Summary.  MyChart is used to connect with patients for Virtual Visits (Telemedicine).  Patients are able to view lab/test results, encounter notes, upcoming appointments, etc.  Non-urgent messages can be sent to your provider as well.   To learn more about what you can do with MyChart, go to [email or web address].    Your next appointment:   1 year(s)  The format for your next appointment:   In Person  Provider:   You may see Kacey Dysert Kelly, MD or one of the following Advanced Practice Providers on your designated Care Team:   Hao Meng, PA-C Angela Duke, PA-C or  Krista Kroeger, PA-C     

## 2020-11-14 ENCOUNTER — Encounter: Payer: Self-pay | Admitting: Cardiovascular Disease

## 2021-08-02 ENCOUNTER — Encounter: Payer: Self-pay | Admitting: Cardiovascular Disease

## 2021-08-02 ENCOUNTER — Ambulatory Visit: Payer: Medicare Other | Admitting: Cardiovascular Disease

## 2021-08-02 ENCOUNTER — Ambulatory Visit (INDEPENDENT_AMBULATORY_CARE_PROVIDER_SITE_OTHER): Payer: Medicare Other | Admitting: Cardiovascular Disease

## 2021-08-02 VITALS — BP 140/64 | HR 54 | Ht 73.0 in | Wt 198.4 lb

## 2021-08-02 DIAGNOSIS — I483 Typical atrial flutter: Secondary | ICD-10-CM

## 2021-08-02 DIAGNOSIS — E785 Hyperlipidemia, unspecified: Secondary | ICD-10-CM

## 2021-08-02 DIAGNOSIS — I350 Nonrheumatic aortic (valve) stenosis: Secondary | ICD-10-CM

## 2021-08-02 DIAGNOSIS — I5032 Chronic diastolic (congestive) heart failure: Secondary | ICD-10-CM | POA: Diagnosis not present

## 2021-08-02 DIAGNOSIS — Z953 Presence of xenogenic heart valve: Secondary | ICD-10-CM | POA: Diagnosis not present

## 2021-08-02 NOTE — Patient Instructions (Signed)
Medication Instructions:  Continue same medications *If you need a refill on your cardiac medications before your next appointment, please call your pharmacy*   Lab Work: None ordered   Testing/Procedures: Schedule  Echo in 04/2022   Follow-Up: At [facility], you and your health needs are our priority.  As part of our continuing mission to provide you with exceptional heart care, we have created designated Provider Care Teams.  These Care Teams include your primary Cardiologist (physician) and Advanced Practice Providers (APPs -  Physician Assistants and Nurse Practitioners) who all work together to provide you with the care you need, when you need it.  We recommend signing up for the patient portal called "MyChart".  Sign up information is provided on this After Visit Summary.  MyChart is used to connect with patients for Virtual Visits (Telemedicine).  Patients are able to view lab/test results, encounter notes, upcoming appointments, etc.  Non-urgent messages can be sent to your provider as well.   To learn more about what you can do with MyChart, go to [email or web address].    Your next appointment:  04/2022 after echo    The format for your next appointment: Office    Provider:  [facility]   Important Information About Sugar

## 2021-08-02 NOTE — Progress Notes (Signed)
Patient ID: Erik [name], male   DOB: [birth year], 86 y.o.   MRN: [id number]    PCP: Dr. Jani Gravel  HPI: Erik [name] is a 86 y.o. male who presents to the office today for a 7 month follow up cardiology evaluation.  I initially saw Erik [name] in cardiology consultation in April 2016 while he was in the hospital after he experienced a brief episode of chest discomfort associated with presyncope and transient visual blurring.  Physical examination suggested a murmur of severe aortic stenosis.  This was verified by echo Doppler data, which suggested critical AS.  I performed a right and left heart catheterization 06/22/2014.  He was found to have critical aortic valve stenosis without significant coronary obstructive disease with only a smooth 20% luminal narrowing of the proximal RCA and moderate global LV dysfunction with an EF of 35-40%.  He had upper normal right heart pressures.  He underwent aortic valve replacement surgery by Dr. Roxy Manns using a bioprosthetic tissue valve on 06/30/2014.  During his hospitalization, he was taken off all beta blockers because of bradycardia.  Ultimately, beta blocker was reinstituted.  He underwent cardioversion on 10/09/2014 after being on Coumadin anticoagulation for over a month.  He was seen on 10/25/2014 by [name], which time he was found to be back in atrial flutter.  He subsequent was referred to Dr. Rayann Heman who saw him on 11/29/2014.  He underwent successful atrial flutter ablation by Dr. Rayann Heman on 12/19/2014.  Subsequently, he was taken off essentially all medications with the exception of aspirin.  He is not aware of any recurrent atrial flutter or atrial fibrillation.    A follow-up echo Doppler study in 06/25/2015 showed an ejection fraction of 60-65% with normal wall motion and diastolic function.  His aortic valve bioprosthesis was well-seated.  There was no stenosis.  The mean gradient was a peak gradient 15 concordant with the valve.  His right atrium was  mild moderately dilated.  He has seen Dr. Roxy Manns for follow-up evaluation who felt he was stable from his surgical standpoint.  Erik [name] continues to feel well.  When I last saw him, he had an atrial bigeminal pattern.  He has had issues in the past with bradycardia. He is not on any medication except ASA 81 mg and denies any recurrent awareness of arrhythmia.  He continues to work in his son's [place] and remains very active.  He denies chest pain, PND, orthopnea.  He denies presyncope or syncope.  A one-year follow-up echo Doppler study on 05/20/2016 revealed an ejection fraction at  50-55%.  His bioprosthetic aortic valve was well-seated.  There was no aortic regurgitation or perivalvular leak.  Mean gradient was 7 and peak gradient 12.  There was trivial TR and PR and his left atrium was mildly dilated.   When I saw him in November 2018 he denied any chest pain or shortness of breath, dizziness or palpitations. At that time he was asymptomatic and denied chest pain, shortness of breath, dizziness, or palpitations. He had been fixing up his house in anticipation of selling his house and ultimately moving to [facility]. Lab work which showed his LDL cholesterol had risen to 113.  Is only been taking a baby aspirin.  TSH was normal at 2.42.   When I saw him in May 2019 he remained stable.  He was found to have macrocytosis on his CBC with an MCV of 102.  Subsequent B12 and folate levels were normal.  He remains very active on his land.  He denies chest pain PND orthopnea, presyncope or syncope or palpitations.    I saw him in December 2019  and last saw him in a telemedicine visit on September 02, 2018.  At that time he continued to be active.  He lives in the [place] area.  He walks outside, does yardwork, mowing, and keeps himself very busy.  He is asymptomatic and denies chest pain PND orthopnea palpitations or any shortness of breath.  He underwent a follow-up echo Doppler study on  August 24, 2018.  This continued to show normal systolic function as well as normal diastolic function.  There was abnormal septal motion consistent with postoperative state.  There was mild biatrial enlargement.  His 25 mm Edwards bovine bioprosthesis valve was well-seated and functioning normally. The mean gradient was 5 with a peak gradient of 14, normal for the valve.   I saw him in January 2021.  [place] months there had not been many people there and he was staying safe with reference to COVID-19.  He denied palpitations, presyncope or syncope.  I last saw him in July 2021.  That time he remained asymptomatic living at [facility].  At times he experienced  a rare palpitation, isolated.  He denied presyncope or syncope.  He has not had recent laboratory.  He remained active.     He underwent laboratory in August 2021 which was stable.  Potassium had normalized from an increased value in July.  I recommended he undergo an echo Doppler study for further evaluation of his AVR.  I last saw him on April 09, 2020.  Since I last saw him, he has remained stable.  He continues to be active and lives full-time at [facility].  He will be establishing primary care with Dr. [name] a new physician who joined [place] at [facility].  A follow-up echo Doppler study on April 09, 2020 showed normal LV function with EF at 60 to 65% with moderate concentric LVH.  He had normal diastolic parameters.  His Edwards SAPIEN valve was well-seated with an 8 mm mean gradient and peak gradient of 13.4.  He denied chest pain PND orthopnea, palpitations, or shortness of breath.    I last saw him on November 12, 2020.  He continues to be asymptomatic.  He is now [name].  He remains free without chest pain or shortness of breath.  He denies palpitations.  He has been taking rosuvastatin 10  mg on Monday Wednesday Friday and Saturday.  Laboratory in July 2021 showed an LDL cholesterol at 84, triglycerides 95 total cholesterol 138 and HDL 36.    Since his last evaluation, Erik [name] remains asymptomatic.  He remains very active at his [place] and recently rebuilt a new deck.  He underwent laboratory on May 21, 2021 by Dr. [name] in [facility].  Total cholesterol was 119, HDL 34, triglycerides 122, and LDL 65.  He denies any chest pain.  He denies any shortness of breath.  There is no PND orthopnea.  He denies palpitations.  He is on aspirin 81 mg in addition to rosuvastatin 10 mg.  He presents for evaluation.    Past Medical History:  Diagnosis Date   Aortic stenosis, severe    Atrial flutter, post-operative 07/27/2014   typical  Chronic diastolic congestive heart failure (HCC)    S/P aortic valve replacement with bioprosthetic valve 06/30/2014   25 mm [facility] Ease bovine pericardial tissue valve   Sinus bradycardia     Past Surgical History:  Procedure Laterality Date   AORTIC VALVE REPLACEMENT N/A 06/30/2014   Procedure: AORTIC VALVE REPLACEMENT (AVR);  Surgeon: Rexene Alberts, MD;  Location: [place];  Service: Open Heart Surgery;  Laterality: N/A;   APPENDECTOMY     CARDIOVERSION N/A 10/09/2014   Procedure: CARDIOVERSION;  Surgeon: Troy Sine, MD;  Location: [place];  Service: Cardiovascular;  Laterality: N/A;   CATARACT EXTRACTION Bilateral ? 2013    & 2014   ELECTROPHYSIOLOGIC STUDY N/A 12/19/2014   CTI ablation by Dr Rayann Heman   HAND SURGERY     INTRAOPERATIVE TRANSESOPHAGEAL ECHOCARDIOGRAM N/A 06/30/2014   Procedure: INTRAOPERATIVE TRANSESOPHAGEAL ECHOCARDIOGRAM;  Surgeon: Rexene Alberts, MD;  Location: [place];  Service: Open Heart Surgery;  Laterality: N/A;   LEFT AND RIGHT HEART CATHETERIZATION WITH CORONARY ANGIOGRAM N/A 06/22/2014   Procedure: LEFT AND RIGHT HEART CATHETERIZATION WITH CORONARY ANGIOGRAM;  Surgeon: Troy Sine, MD;  Location: [facility]  CATH LAB;  Service: Cardiovascular;  Laterality: N/A;    No Known Allergies  Current Outpatient Medications  Medication Sig Dispense Refill   aspirin EC 81 MG tablet Take 81 mg by mouth daily.     rosuvastatin (CRESTOR) 10 MG tablet Take 1 tablet (10 mg total) by mouth daily. 90 tablet 3   No current facility-administered medications for this visit.    Social History   Socioeconomic History   Marital status: Married    Spouse name: Not on file   Number of children: Not on file   Years of education: Not on file   Highest education level: Not on file  Occupational History   Not on file  Tobacco Use   Smoking status: Never   Smokeless tobacco: Never   Tobacco comments:    smoked in college  Vaping Use   Vaping Use: Never used  Substance and Sexual Activity   Alcohol use: No    Comment: occ    Drug use: No   Sexual activity: Yes  Other Topics Concern   Not on file  Social History Narrative   Pt lives in [place] with spouse.  Retired Scientist, physiological.   Social Determinants of Health   Financial Resource Strain: Not on file  Food Insecurity: Not on file  Transportation Needs: Not on file  Physical Activity: Not on file  Stress: Not on file  Social Connections: Not on file  Intimate Partner Violence: Not on file    Family History  Problem Relation Age of Onset   Stroke Mother    Hypertension Mother    Cancer Father     ROS General: Negative; No fevers, chills, or night sweats HEENT: Negative; No changes in vision or hearing, sinus congestion, difficulty swallowing Pulmonary: Negative; No cough, wheezing, shortness of breath, hemoptysis Cardiovascular: See HPI:  GI: Negative; No nausea, vomiting, diarrhea, or abdominal pain GU: Negative; No dysuria, hematuria, or difficulty voiding Musculoskeletal: Negative; no myalgias, joint pain, or weakness Hematologic: Negative; no easy bruising, bleeding Endocrine: Negative; no heat/cold intolerance; no  diabetes, Neuro: Negative; no changes in balance, headaches Skin: Negative; No rashes or skin lesions Psychiatric: Negative; No behavioral problems, depression Sleep: Negative; No snoring,  daytime sleepiness, hypersomnolence, bruxism, restless legs, hypnogognic hallucinations. Other comprehensive 14 point system review is negative   Physical  Exam BP 140/64   Pulse (!) 54   Ht $R'6\' 1"'Qu$  (1.854 m)   Wt 198 lb 6.4 oz (90 kg)   SpO2 99%   BMI 26.18 kg/m    Repeat blood pressure by me was 134/72  Wt Readings from Last 3 Encounters:  08/02/21 198 lb 6.4 oz (90 kg)  11/12/20 189 lb 3.2 oz (85.8 kg)  04/09/20 189 lb (85.7 kg)   General: Alert, oriented, no distress.  Skin: normal turgor, no rashes, warm and dry HEENT: Normocephalic, atraumatic. Pupils equal round and reactive to light; sclera anicteric; extraocular muscles intact; Nose without nasal septal hypertrophy Mouth/Parynx benign; Mallinpatti scale 2 Neck: No JVD, no carotid bruits; normal carotid upstroke Lungs: clear to ausculatation and percussion; no wheezing or rales Chest wall: without tenderness to palpitation Heart: PMI not displaced, RRR, s1 s2 normal, 4-6/5 systolic murmur, no diastolic murmur, no rubs, gallops, thrills, or heaves Abdomen: soft, nontender; no hepatosplenomehaly, BS+; abdominal aorta nontender and not dilated by palpation. Back: no CVA tenderness Pulses 2+ Musculoskeletal: full range of motion, normal strength, no joint deformities Extremities: no clubbing cyanosis or edema, Homan's sign negative  Neurologic: grossly nonfocal; Cranial nerves grossly wnl Psychologic: Normal mood and affect   May 19,2023 ECG (independently read by me): Sinus bradycardia at 54, 1st degree AV block PR 234mse; .isolated PVC,PRWP  November 12, 2020 ECG (independently read by me): Probable low atrial rhythm at 53,  PAC, low voltage  April 09, 2020 ECG (independently read by me): Sinus rhythm with !st degree AV block;  PRWP  July 2021 ECG (independently read by me): Sinus bradycardia 55 bpm, first-degree AV block with a PR of 218 ms.  PACs.  R wave progression, unchanged.  T wave abnormality inferiorly.  January 2021 ECG (independently read by me): Sinus rhythm with an isolated PVC and occasional PACs.  December 2019 ECG (independently read by me): Sinus bradycardia 54 bpm.  First-degree AV block with a PR interval at 234 ms.  Poor anterior R wave progression  May 2019 ECG (independently read by me): Sinus bradycardia at 57 bpm.  First-degree AV block with a PR interval at 232 ms.  PAC.  QS complex.  November 2018 ECG (independently read by me): Sinus bradycardia 54 bpm with sinus arrhythmia.  First degree AV block with a PR interval at 214 ms.  PAC.  Poor anterior R-wave progression V1 through V4.  March 2018 ECG (independently read by me): Sinus bradycardia with first degree AV block.  PAC is in an atrial bigeminal rhythm.    September 2017 ECG (independently read by me): Sinus bradycardia 53 bpm.  First-degree AV block.  There are PACs and atrial bigeminal pattern.  He has poor anterior R-wave voltage.  January 2017 ECG (independently read by me): Sinus bradycardia 50 bpm with first-degree AV block with a PR interval at 234 ms and evidence for occasional PAC.  September 2016 ECG (independently read by me): Atrial flutter with 4:1 block at 64 bpm.  09/11/2014 ECG (independently read by me): Atrial flutter with 41 block.  One isolated PVC.  Ventricular rate 67 bpm.  Nonspecific ST-T change  Prior ECG (independently read by me): Underlying atrial flutter with a ventricular rate at 88 with frequent PVCs in a bigeminal pattern transiently.  LABS:     Latest Ref Rng & Units 10/27/2019   12:14 PM 10/14/2019   12:04 PM 04/11/2019   12:16 PM  BMP  Glucose 65 - 99 mg/dL 85   96  80    BUN 8 - 27 mg/dL $Remove'13   10   13    'JRkUXFX$ Creatinine 0.76 - 1.27 mg/dL 1.10   1.15   0.96    BUN/Creat Ratio 10 - $Re'24 12   9   14     'VsR$ Sodium 134 - 144 mmol/L 144   145   146    Potassium 3.5 - 5.2 mmol/L 4.6   5.7   4.7    Chloride 96 - 106 mmol/L 108   100   106    CO2 20 - 29 mmol/L $RemoveB'22   25   23    'iqyUEPOa$ Calcium 8.6 - 10.2 mg/dL 9.1   9.7   9.3         Latest Ref Rng & Units 10/27/2019   12:14 PM 10/14/2019   12:04 PM 04/11/2019   12:16 PM  Hepatic Function  Total Protein 6.0 - 8.5 g/dL 6.8   7.8   7.7    Albumin 3.6 - 4.6 g/dL 4.3   4.6   4.6    AST 0 - 40 IU/L $Remov'14   16   21    'KhOuIX$ ALT 0 - 44 IU/L $Remov'12   12   14    'SgLmCm$ Alk Phosphatase 48 - 121 IU/L 77   84   80    Total Bilirubin 0.0 - 1.2 mg/dL 0.5   1.0   1.1         Latest Ref Rng & Units 10/14/2019   12:04 PM 04/11/2019   12:16 PM 07/20/2017    8:21 AM  CBC  WBC 3.4 - 10.8 x10E3/uL 7.0   6.7   6.0    Hemoglobin 13.0 - 17.7 g/dL 15.5   14.2   14.1    Hematocrit 37.5 - 51.0 % 43.6   40.9   40.5    Platelets 150 - 450 x10E3/uL 215   209   240     Lab Results  Component Value Date   MCV 101 (H) 10/14/2019   MCV 100 (H) 04/11/2019   MCV 102 (H) 07/20/2017    Lab Results  Component Value Date   TSH 1.210 04/11/2019    BNP    Component Value Date/Time   BNP 1,282.9 (H) 06/19/2014 1630    ProBNP No results found for: PROBNP   Lipid Panel     Component Value Date/Time   CHOL 138 10/14/2019 1204   TRIG 95 10/14/2019 1204   HDL 36 (L) 10/14/2019 1204   CHOLHDL 3.8 10/14/2019 1204   CHOLHDL 4.8 06/11/2016 1120   VLDL 19 06/11/2016 1120   LDLCALC 84 10/14/2019 1204     RADIOLOGY: No results found.   IMPRESSION:  1. S/P aortic valve replacement with35mm Edwards-Sapien bioprosthetic valve: June 30, 2014   2. h/o Aortic stenosis, severe   3. Chronic diastolic congestive heart failure ([place])   4. Hyperlipidemia with target LDL less than 70   5. Typical atrial flutter [facility]): Status post ablation December 19, 2014, Dr. Rayann Heman    ASSESSMENT AND PLAN: Erik [name] is a young appearing 86 year-old gentleman who was found to have severe symptomatic  critical aortic valve stenosis after he presented to the hospital following a presyncopal spell in 2016.  He successfully underwent aortic valve replacement surgery on 06/30/2014 with a 25 mm [facility] Ease bovine pericardial tissue valve.  He developed atrial flutter in October 2016 and underwent successful ablation.  He was taken off warfarin therapy  as well as beta blocker therapy.  He continues to be on aspirin alone.  Recently, he continues to be asymptomatic.  He has been taking rosuvastatin 10 mg on Monday Wednesday and Friday.  Lipid studies in July 2021 showed cholesterol 138, LDL cholesterol was 84.  At that time he denied any myalgias or arthralgias and I suggested he increase rosuvastatin to every other day for 2 to 4 weeks and if tolerated increase to daily.  Apparently he never increased the rosuvastatin to daily and presently has been taking this on Monday, Wednesday already and Saturday.  Subsequent evaluation from November 06, 2020 showed an LDL cholesterol at 79.     He  established with Dr. [name] in [facility] at [facility] [place] in [place].  He continues to be asymptomatic.  His 25 mm Edwards sapien valve at last echo in January 2022 had a mean gradient of 8, and peak gradient 13.4 mmHg.Marland Kitchen  His presyncope or syncope or palpitations.  I reviewed his most recent laboratory which he had done on May 21, 2021.  Lipid studies are excellent with total cholesterol 119, HDL 34, triglycerides 122 and LDL 65.  Hemoglobin 13.8 hematocrit 40.4.  Chemistry was normal with glucose 90, creatinine 1.04.  LFTs normal.  Clinically he continues to do well and remains asymptomatic now 7 years status post aortic valve replacement.  I have recommended in February 2024 he undergo a 2-year follow-up echo Doppler assessment and we will see him in follow-up of his echo study.  Troy Sine, MD, [facility]  08/02/2021 2:46 PM

## 2021-11-18 ENCOUNTER — Other Ambulatory Visit: Payer: Self-pay | Admitting: Cardiovascular Disease

## 2022-04-21 ENCOUNTER — Other Ambulatory Visit (HOSPITAL_COMMUNITY): Payer: Medicare Other

## 2022-05-22 ENCOUNTER — Telehealth: Payer: Self-pay | Admitting: Cardiovascular Disease

## 2022-05-22 DIAGNOSIS — E785 Hyperlipidemia, unspecified: Secondary | ICD-10-CM

## 2022-05-22 DIAGNOSIS — Z79899 Other long term (current) drug therapy: Secondary | ICD-10-CM

## 2022-05-22 DIAGNOSIS — I5032 Chronic diastolic (congestive) heart failure: Secondary | ICD-10-CM

## 2022-05-22 NOTE — Telephone Encounter (Signed)
Pt's wife calling in regards to lab orders. Please advise.

## 2022-05-22 NOTE — Telephone Encounter (Signed)
Called wife (DPR) she would like for pt to have lab done before his appointment. What would you like to order?

## 2022-05-24 NOTE — Telephone Encounter (Signed)
Recommend c-Met, CBC, TSH, fasting lipid panel, LP(a), and if he has not had any hemoglobin A1c in some time can consider checking this as well

## 2022-05-26 NOTE — Telephone Encounter (Signed)
Pt wife (DPR), informed of providers result & recommendations. Pt verbalized understanding. All questions, if any, were answered.

## 2022-06-04 LAB — CBC
Hematocrit: 41.8 % (ref 37.5–51.0)
Hemoglobin: 15.2 g/dL (ref 13.0–17.7)
MCH: 36.6 pg — ABNORMAL HIGH (ref 26.6–33.0)
MCHC: 36.4 g/dL — ABNORMAL HIGH (ref 31.5–35.7)
MCV: 101 fL — ABNORMAL HIGH (ref 79–97)
Platelets: 192 10*3/uL (ref 150–450)
RBC: 4.15 x10E6/uL (ref 4.14–5.80)
RDW: 12.5 % (ref 11.6–15.4)
WBC: 6.5 10*3/uL (ref 3.4–10.8)

## 2022-06-04 LAB — COMPREHENSIVE METABOLIC PANEL
ALT: 12 IU/L (ref 0–44)
AST: 17 IU/L (ref 0–40)
Albumin/Globulin Ratio: 1.4 (ref 1.2–2.2)
Albumin: 4.4 g/dL (ref 3.7–4.7)
Alkaline Phosphatase: 80 IU/L (ref 44–121)
BUN/Creatinine Ratio: 12 (ref 10–24)
BUN: 14 mg/dL (ref 8–27)
Bilirubin Total: 0.9 mg/dL (ref 0.0–1.2)
CO2: 24 mmol/L (ref 20–29)
Calcium: 9.8 mg/dL (ref 8.6–10.2)
Chloride: 105 mmol/L (ref 96–106)
Creatinine, Ser: 1.15 mg/dL (ref 0.76–1.27)
Globulin, Total: 3.1 g/dL (ref 1.5–4.5)
Glucose: 95 mg/dL (ref 70–99)
Potassium: 5 mmol/L (ref 3.5–5.2)
Sodium: 141 mmol/L (ref 134–144)
Total Protein: 7.5 g/dL (ref 6.0–8.5)
eGFR: 63 mL/min/{1.73_m2} (ref >59)

## 2022-06-04 LAB — LIPID PANEL
Chol/HDL Ratio: 3.3 ratio (ref 0.0–5.0)
Cholesterol, Total: 116 mg/dL (ref 100–199)
HDL: 35 mg/dL — ABNORMAL LOW (ref >39)
LDL Chol Calc (NIH): 64 mg/dL (ref 0–99)
Triglycerides: 87 mg/dL (ref 0–149)
VLDL Cholesterol Cal: 17 mg/dL (ref 5–40)

## 2022-06-04 LAB — HEMOGLOBIN A1C
Est. average glucose Bld gHb Est-mCnc: 97 mg/dL
Hgb A1c MFr Bld: 5 % (ref 4.8–5.6)

## 2022-06-04 LAB — TSH: TSH: 1.83 u[IU]/mL (ref 0.450–4.500)

## 2022-06-04 LAB — LIPOPROTEIN A (LPA): Lipoprotein (a): 139.9 nmol/L — ABNORMAL HIGH (ref <75.0)

## 2022-06-06 ENCOUNTER — Ambulatory Visit (HOSPITAL_BASED_OUTPATIENT_CLINIC_OR_DEPARTMENT_OTHER): Payer: Medicare Other

## 2022-06-06 ENCOUNTER — Encounter: Payer: Self-pay | Admitting: Cardiovascular Disease

## 2022-06-06 ENCOUNTER — Ambulatory Visit: Payer: Medicare Other | Attending: Cardiovascular Disease | Admitting: Cardiovascular Disease

## 2022-06-06 VITALS — BP 122/68 | HR 64 | Ht 73.0 in | Wt 192.6 lb

## 2022-06-06 DIAGNOSIS — I483 Typical atrial flutter: Secondary | ICD-10-CM | POA: Diagnosis not present

## 2022-06-06 DIAGNOSIS — Z953 Presence of xenogenic heart valve: Secondary | ICD-10-CM | POA: Insufficient documentation

## 2022-06-06 DIAGNOSIS — E7841 Elevated Lipoprotein(a): Secondary | ICD-10-CM

## 2022-06-06 DIAGNOSIS — E785 Hyperlipidemia, unspecified: Secondary | ICD-10-CM | POA: Insufficient documentation

## 2022-06-06 DIAGNOSIS — I350 Nonrheumatic aortic (valve) stenosis: Secondary | ICD-10-CM | POA: Diagnosis not present

## 2022-06-06 DIAGNOSIS — I5032 Chronic diastolic (congestive) heart failure: Secondary | ICD-10-CM | POA: Insufficient documentation

## 2022-06-06 LAB — ECHOCARDIOGRAM COMPLETE
AR max vel: 1.29 cm2
AV Area VTI: 1.14 cm2
AV Area mean vel: 1.27 cm2
AV Mean grad: 11 mmHg
AV Peak grad: 21.8 mmHg
Ao pk vel: 2.34 m/s
Area-P 1/2: 2.69 cm2
S' Lateral: 2.6 cm

## 2022-06-06 MED ORDER — ROSUVASTATIN CALCIUM 20 MG PO TABS: 20.0000 mg | ORAL_TABLET | Freq: Every day | ORAL | 3 refills | Status: DC

## 2022-06-06 NOTE — Patient Instructions (Signed)
Medication Instructions:  DECREASE rosuvastatin (Crestor) to 20 mg daily  *If you need a refill on your cardiac medications before your next appointment, please call your pharmacy*   Lab Work: Please return for FASTING labs in 4 months (CMET, Lipid)  Our in office lab hours are Monday-Friday 8:00-4:00, closed for lunch 12:45-1:45 pm.  No appointment needed.  [facility] locations:   [place] ([place]) - [id number] [place] [address] - [address] [place] - [address] ([facility])  Follow-Up: At [facility], you and your health needs are our priority.  As part of our continuing mission to provide you with exceptional heart care, we have created designated Provider Care Teams.  These Care Teams include your primary Cardiologist (physician) and Advanced Practice Providers (APPs -  Physician Assistants and Nurse Practitioners) who all work together to provide you with the care you need, when you need it.  We recommend signing up for the patient portal called "MyChart".  Sign up information is provided on this After Visit Summary.  MyChart is used to connect with patients for Virtual Visits (Telemedicine).  Patients are able to view lab/test results, encounter notes, upcoming appointments, etc.  Non-urgent messages can be sent to your provider as well.   To learn more about what you can do with MyChart, go to [email or web address].    Your next appointment:   6 month(s)  Provider:   Dr. Claiborne Billings

## 2022-06-06 NOTE — Progress Notes (Signed)
Patient ID: Erik [name], male   DOB: 1936-03-12, 87 y.o.   MRN: [id number]    PCP: Dr. Jani Gravel  HPI: Erik [name] is a 87 y.o. male who presents to the office today for a 7 month follow up cardiology evaluation.  I initially saw Erik [name] in cardiology consultation in April 2016 while he was in the hospital after he experienced a brief episode of chest discomfort associated with presyncope and transient visual blurring.  Physical examination suggested a murmur of severe aortic stenosis.  This was verified by echo Doppler data, which suggested critical AS.  I performed a right and left heart catheterization 06/22/2014.  He was found to have critical aortic valve stenosis without significant coronary obstructive disease with only a smooth 20% luminal narrowing of the proximal RCA and moderate global LV dysfunction with an EF of 35-40%.  He had upper normal right heart pressures.  He underwent aortic valve replacement surgery by Dr. [name] using a bioprosthetic tissue valve on 06/30/2014.  During his hospitalization, he was taken off all beta blockers because of bradycardia.  Ultimately, beta blocker was reinstituted.  He underwent cardioversion on 10/09/2014 after being on Coumadin anticoagulation for over a month.  He was seen on 10/25/2014 by [name], which time he was found to be back in atrial flutter.  He subsequent was referred to Dr. [name] who saw him on 11/29/2014.  He underwent successful atrial flutter ablation by Dr. [name] on 12/19/2014.  Subsequently, he was taken off essentially all medications with the exception of aspirin.  He is not aware of any recurrent atrial flutter or atrial fibrillation.    A follow-up echo Doppler study in 06/25/2015 showed an ejection fraction of 60-65% with normal wall motion and diastolic function.  His aortic valve bioprosthesis was well-seated.  There was no stenosis.  The mean gradient was a peak gradient 15 concordant with the valve.  His right atrium was  mild moderately dilated.  He has seen Dr. [name] for follow-up evaluation who felt he was stable from his surgical standpoint.  Erik [name] continues to feel well.  When I last saw him, he had an atrial bigeminal pattern.  He has had issues in the past with bradycardia. He is not on any medication except ASA 81 mg and denies any recurrent awareness of arrhythmia.  He continues to work in his son's [employer] and remains very active.  He denies chest pain, PND, orthopnea.  He denies presyncope or syncope.  A one-year follow-up echo Doppler study on 05/20/2016 revealed an ejection fraction at  50-55%.  His bioprosthetic aortic valve was well-seated.  There was no aortic regurgitation or perivalvular leak.  Mean gradient was 7 and peak gradient 12.  There was trivial TR and PR and his left atrium was mildly dilated.   When I saw him in November 2018 he denied any chest pain or shortness of breath, dizziness or palpitations. At that time he was asymptomatic and denied chest pain, shortness of breath, dizziness, or palpitations. He had been fixing up his house in anticipation of selling his house and ultimately moving to [facility]. Lab work which showed his LDL cholesterol had risen to 113.  Is only been taking a baby aspirin.  TSH was normal at 2.42.   When I saw him in May 2019 he remained stable.  He was found to have macrocytosis on his CBC with an MCV of 102.  Subsequent B12 and folate levels were normal.  He remains very active on his land.  He denies chest pain PND orthopnea, presyncope or syncope or palpitations.    I saw him in December 2019  and last saw him in a telemedicine visit on September 02, 2018.  At that time he continued to be active.  He lives in the [place] area.  He walks outside, does yardwork, mowing, and keeps himself very busy.  He is asymptomatic and denies chest pain PND orthopnea palpitations or any shortness of breath.  He underwent a follow-up echo Doppler study on  August 24, 2018.  This continued to show normal systolic function as well as normal diastolic function.  There was abnormal septal motion consistent with postoperative state.  There was mild biatrial enlargement.  His 25 mm Edwards bovine bioprosthesis valve was well-seated and functioning normally. The mean gradient was 5 with a peak gradient of 14, normal for the valve.   I saw him in January 2021.  [place] months there had not been many people there and he was staying safe with reference to COVID-19.  He denied palpitations, presyncope or syncope.  I last saw him in July 2021.  That time he remained asymptomatic living at [facility].  At times he experienced  a rare palpitation, isolated.  He denied presyncope or syncope.  He has not had recent laboratory.  He remained active.     He underwent laboratory in August 2021 which was stable.  Potassium had normalized from an increased value in July.  I recommended he undergo an echo Doppler study for further evaluation of his AVR.  I last saw him on April 09, 2020.  Since I last saw him, he has remained stable.  He continues to be active and lives full-time at [facility].  He will be establishing primary care with Dr. [name] a new physician who joined [place] at [facility].  A follow-up echo Doppler study on April 09, 2020 showed normal LV function with EF at 60 to 65% with moderate concentric LVH.  He had normal diastolic parameters.  His Edwards SAPIEN valve was well-seated with an 8 mm mean gradient and peak gradient of 13.4.  He denied chest pain PND orthopnea, palpitations, or shortness of breath.    I last saw him on November 12, 2020.  He continues to be asymptomatic.  He is [name].  He remains free without chest pain or shortness of breath.  He denies palpitations.  He has been taking rosuvastatin 10  mg on Monday Wednesday Friday and Saturday.  Laboratory in July 2021 showed an LDL cholesterol at 84, triglycerides 95 total cholesterol 138 and HDL 36.    Since his last evaluation, Baillie remains asymptomatic.  He remains very active at his [place] and recently rebuilt a new deck.  He underwent laboratory on May 21, 2021 by Dr. [name] in [facility].  Total cholesterol was 119, HDL 34, triglycerides 122, and LDL 65.  He denies any chest pain.  He denies any shortness of breath.  There is no PND orthopnea.  He denies palpitations.  He is on aspirin 81 mg in addition to rosuvastatin 10 mg.  He presents for evaluation.    Past Medical History:  Diagnosis Date   Aortic stenosis, severe    Atrial flutter, post-operative 07/27/2014   typical  Chronic diastolic congestive heart failure (HCC)    S/P aortic valve replacement with bioprosthetic valve 06/30/2014   25 mm [facility] Ease bovine pericardial tissue valve   Sinus bradycardia     Past Surgical History:  Procedure Laterality Date   AORTIC VALVE REPLACEMENT N/A 06/30/2014   Procedure: AORTIC VALVE REPLACEMENT (AVR);  Surgeon: Rexene Alberts, MD;  Location: [place];  Service: Open Heart Surgery;  Laterality: N/A;   APPENDECTOMY     CARDIOVERSION N/A 10/09/2014   Procedure: CARDIOVERSION;  Surgeon: Troy Sine, MD;  Location: [place];  Service: Cardiovascular;  Laterality: N/A;   CATARACT EXTRACTION Bilateral ? 2013    & 2014   ELECTROPHYSIOLOGIC STUDY N/A 12/19/2014   CTI ablation by Dr [name]   HAND SURGERY     INTRAOPERATIVE TRANSESOPHAGEAL ECHOCARDIOGRAM N/A 06/30/2014   Procedure: INTRAOPERATIVE TRANSESOPHAGEAL ECHOCARDIOGRAM;  Surgeon: Rexene Alberts, MD;  Location: [place];  Service: Open Heart Surgery;  Laterality: N/A;   LEFT AND RIGHT HEART CATHETERIZATION WITH CORONARY ANGIOGRAM N/A 06/22/2014   Procedure: LEFT AND RIGHT HEART CATHETERIZATION WITH CORONARY ANGIOGRAM;  Surgeon: Troy Sine, MD;  Location: [facility]  CATH LAB;  Service: Cardiovascular;  Laterality: N/A;    No Known Allergies  Current Outpatient Medications  Medication Sig Dispense Refill   aspirin EC 81 MG tablet Take 81 mg by mouth daily.     rosuvastatin (CRESTOR) 10 MG tablet TAKE ONE TABLET once daily 90 tablet 3   No current facility-administered medications for this visit.    Social History   Socioeconomic History   Marital status: Married    Spouse name: Not on file   Number of children: Not on file   Years of education: Not on file   Highest education level: Not on file  Occupational History   Not on file  Tobacco Use   Smoking status: Never   Smokeless tobacco: Never   Tobacco comments:    smoked in college  Vaping Use   Vaping Use: Never used  Substance and Sexual Activity   Alcohol use: No    Comment: occ    Drug use: No   Sexual activity: Yes  Other Topics Concern   Not on file  Social History Narrative   Pt lives in [place] with spouse.  Retired Scientist, physiological.   Social Determinants of Health   Financial Resource Strain: Not on file  Food Insecurity: Not on file  Transportation Needs: Not on file  Physical Activity: Not on file  Stress: Not on file  Social Connections: Not on file  Intimate Partner Violence: Not on file    Family History  Problem Relation Age of Onset   Stroke Mother    Hypertension Mother    Cancer Father     ROS General: Negative; No fevers, chills, or night sweats HEENT: Negative; No changes in vision or hearing, sinus congestion, difficulty swallowing Pulmonary: Negative; No cough, wheezing, shortness of breath, hemoptysis Cardiovascular: See HPI:  GI: Negative; No nausea, vomiting, diarrhea, or abdominal pain GU: Negative; No dysuria, hematuria, or difficulty voiding Musculoskeletal: Negative; no myalgias, joint pain, or weakness Hematologic: Negative; no easy bruising, bleeding Endocrine: Negative; no heat/cold intolerance; no diabetes, Neuro:  Negative; no changes in balance, headaches Skin: Negative; No rashes or skin lesions Psychiatric: Negative; No behavioral problems, depression Sleep: Negative; No snoring,  daytime sleepiness, hypersomnolence, bruxism, restless legs, hypnogognic hallucinations. Other comprehensive 14 point system review is negative   Physical Exam BP 122/68 (BP  Location: Left Arm, Patient Position: Sitting, Cuff Size: Normal)   Pulse 64   Ht 6\' 1"  (1.854 m)   Wt 192 lb 9.6 oz (87.4 kg)   SpO2 99%   BMI 25.41 kg/m    Repeat blood pressure by me was 134/72  Wt Readings from Last 3 Encounters:  06/06/22 192 lb 9.6 oz (87.4 kg)  08/02/21 198 lb 6.4 oz (90 kg)  11/12/20 189 lb 3.2 oz (85.8 kg)   General: Alert, oriented, no distress.  Skin: normal turgor, no rashes, warm and dry HEENT: Normocephalic, atraumatic. Pupils equal round and reactive to light; sclera anicteric; extraocular muscles intact; Nose without nasal septal hypertrophy Mouth/Parynx benign; Mallinpatti scale 2 Neck: No JVD, no carotid bruits; normal carotid upstroke Lungs: clear to ausculatation and percussion; no wheezing or rales Chest wall: without tenderness to palpitation Heart: PMI not displaced, RRR, s1 s2 normal, [id number] systolic murmur, no diastolic murmur, no rubs, gallops, thrills, or heaves Abdomen: soft, nontender; no hepatosplenomehaly, BS+; abdominal aorta nontender and not dilated by palpation. Back: no CVA tenderness Pulses 2+ Musculoskeletal: full range of motion, normal strength, no joint deformities Extremities: no clubbing cyanosis or edema, Homan's sign negative  Neurologic: grossly nonfocal; Cranial nerves grossly wnl Psychologic: Normal mood and affect  ECG (independently read by me): Low atrial and sinus rhythm at 64  May 19,2023 ECG (independently read by me): Sinus bradycardia at 54, 1st degree AV block PR 23mse; .isolated PVC,PRWP  November 12, 2020 ECG (independently read by me): Probable low atrial  rhythm at 53,  PAC, low voltage  April 09, 2020 ECG (independently read by me): Sinus rhythm with !st degree AV block; PRWP  July 2021 ECG (independently read by me): Sinus bradycardia 55 bpm, first-degree AV block with a PR of 218 ms.  PACs.  R wave progression, unchanged.  T wave abnormality inferiorly.  January 2021 ECG (independently read by me): Sinus rhythm with an isolated PVC and occasional PACs.  December 2019 ECG (independently read by me): Sinus bradycardia 54 bpm.  First-degree AV block with a PR interval at 234 ms.  Poor anterior R wave progression  May 2019 ECG (independently read by me): Sinus bradycardia at 57 bpm.  First-degree AV block with a PR interval at 232 ms.  PAC.  QS complex.  November 2018 ECG (independently read by me): Sinus bradycardia 54 bpm with sinus arrhythmia.  First degree AV block with a PR interval at 214 ms.  PAC.  Poor anterior R-wave progression V1 through V4.  March 2018 ECG (independently read by me): Sinus bradycardia with first degree AV block.  PAC is in an atrial bigeminal rhythm.    September 2017 ECG (independently read by me): Sinus bradycardia 53 bpm.  First-degree AV block.  There are PACs and atrial bigeminal pattern.  He has poor anterior R-wave voltage.  January 2017 ECG (independently read by me): Sinus bradycardia 50 bpm with first-degree AV block with a PR interval at 234 ms and evidence for occasional PAC.  September 2016 ECG (independently read by me): Atrial flutter with 4:1 block at 64 bpm.  09/11/2014 ECG (independently read by me): Atrial flutter with 41 block.  One isolated PVC.  Ventricular rate 67 bpm.  Nonspecific ST-T change  Prior ECG (independently read by me): Underlying atrial flutter with a ventricular rate at 88 with frequent PVCs in a bigeminal pattern transiently.  LABS:     Latest Ref Rng & Units 06/02/2022    9:16 AM 10/27/2019  12:14 PM 10/14/2019   12:04 PM  BMP  Glucose 70 - 99 mg/dL 95  85  96   BUN  8 - 27 mg/dL 14  13  10    Creatinine 0.76 - 1.27 mg/dL 1.15  1.10  1.15   BUN/Creat Ratio 10 - 24 12  12  9    Sodium 134 - 144 mmol/L 141  144  145   Potassium 3.5 - 5.2 mmol/L 5.0  4.6  5.7   Chloride 96 - 106 mmol/L 105  108  100   CO2 20 - 29 mmol/L 24  22  25    Calcium 8.6 - 10.2 mg/dL 9.8  9.1  9.7        Latest Ref Rng & Units 06/02/2022    9:16 AM 10/27/2019   12:14 PM 10/14/2019   12:04 PM  Hepatic Function  Total Protein 6.0 - 8.5 g/dL 7.5  6.8  7.8   Albumin 3.7 - 4.7 g/dL 4.4  4.3  4.6   AST 0 - 40 IU/L 17  14  16    ALT 0 - 44 IU/L 12  12  12    Alk Phosphatase 44 - 121 IU/L 80  77  84   Total Bilirubin 0.0 - 1.2 mg/dL 0.9  0.5  1.0        Latest Ref Rng & Units 06/02/2022    9:16 AM 10/14/2019   12:04 PM 04/11/2019   12:16 PM  CBC  WBC 3.4 - 10.8 x10E3/uL 6.5  7.0  6.7   Hemoglobin 13.0 - 17.7 g/dL 15.2  15.5  14.2   Hematocrit 37.5 - 51.0 % 41.8  43.6  40.9   Platelets 150 - 450 x10E3/uL 192  215  209    Lab Results  Component Value Date   MCV 101 (H) 06/02/2022   MCV 101 (H) 10/14/2019   MCV 100 (H) 04/11/2019    Lab Results  Component Value Date   TSH 1.830 06/02/2022    BNP    Component Value Date/Time   BNP 1,282.9 (H) 06/19/2014 1630    ProBNP No results found for: "PROBNP"   Lipid Panel     Component Value Date/Time   CHOL 116 06/02/2022 0916   TRIG 87 06/02/2022 0916   HDL 35 (L) 06/02/2022 0916   CHOLHDL 3.3 06/02/2022 0916   CHOLHDL 4.8 06/11/2016 1120   VLDL 19 06/11/2016 1120   LDLCALC 64 06/02/2022 0916     RADIOLOGY: No results found.   IMPRESSION:  No diagnosis found.  ASSESSMENT AND PLAN: Mr. Jekhi Mcniven is a young appearing 87 year-old gentleman who was found to have severe symptomatic critical aortic valve stenosis after he presented to the hospital following a presyncopal spell in 2016.  He successfully underwent aortic valve replacement surgery on 06/30/2014 with a 25 mm [facility] Ease bovine pericardial  tissue valve.  He developed atrial flutter in October 2016 and underwent successful ablation.  He was taken off warfarin therapy as well as beta blocker therapy.  He continues to be on aspirin alone.  Recently, he continues to be asymptomatic.  He has been taking rosuvastatin 10 mg on Monday Wednesday and Friday.  Lipid studies in July 2021 showed cholesterol 138, LDL cholesterol was 84.  At that time he denied any myalgias or arthralgias and I suggested he increase rosuvastatin to every other day for 2 to 4 weeks and if tolerated increase to daily.  Apparently he never increased the rosuvastatin to daily  and presently has been taking this on Monday, Wednesday already and Saturday.  Subsequent evaluation from November 06, 2020 showed an LDL cholesterol at 79.     He  established with Dr. [name] in [facility] at [facility] in [place].  He continues to be asymptomatic.  His 25 mm Edwards sapien valve at last echo in January 2022 had a mean gradient of 8, and peak gradient 13.4 mmHg.Marland Kitchen  His presyncope or syncope or palpitations.  I reviewed his most recent laboratory which he had done on May 21, 2021.  Lipid studies are excellent with total cholesterol 119, HDL 34, triglycerides 122 and LDL 65.  Hemoglobin 13.8 hematocrit 40.4.  Chemistry was normal with glucose 90, creatinine 1.04.  LFTs normal.  Clinically he continues to do well and remains asymptomatic now 7 years status post aortic valve replacement.  I have recommended in February 2024 he undergo a 2-year follow-up echo Doppler assessment and we will see him in follow-up of his echo study.  Troy Sine, MD, [facility]  06/06/2022 3:37 PM

## 2022-06-07 ENCOUNTER — Encounter: Payer: Self-pay | Admitting: Cardiovascular Disease

## 2022-06-09 ENCOUNTER — Encounter: Payer: Self-pay | Admitting: Cardiovascular Disease

## 2022-06-12 ENCOUNTER — Telehealth: Payer: Self-pay

## 2022-06-12 NOTE — Telephone Encounter (Signed)
Gave pt ECHO results and he has another Question: "Thxs so much. Dr Claiborne Billings was unable to dicuss at visit. Results were not available. Was concern about a bout the enlargement . Dr. Evette Georges thoughts and concerns. Appreciate your prompt response. Have a blessed day. Garlan Fillers

## 2022-06-12 NOTE — Telephone Encounter (Signed)
Will send telephone message to Dr Claiborne Billings.

## 2022-12-04 ENCOUNTER — Ambulatory Visit: Payer: Medicare Other | Attending: Cardiovascular Disease | Admitting: Cardiovascular Disease

## 2022-12-04 ENCOUNTER — Encounter: Payer: Self-pay | Admitting: Cardiovascular Disease

## 2022-12-04 DIAGNOSIS — E785 Hyperlipidemia, unspecified: Secondary | ICD-10-CM | POA: Diagnosis not present

## 2022-12-04 DIAGNOSIS — E7841 Elevated Lipoprotein(a): Secondary | ICD-10-CM | POA: Diagnosis present

## 2022-12-04 DIAGNOSIS — Z953 Presence of xenogenic heart valve: Secondary | ICD-10-CM | POA: Diagnosis present

## 2022-12-04 DIAGNOSIS — I483 Typical atrial flutter: Secondary | ICD-10-CM | POA: Diagnosis not present

## 2022-12-04 DIAGNOSIS — I5032 Chronic diastolic (congestive) heart failure: Secondary | ICD-10-CM | POA: Diagnosis not present

## 2022-12-04 DIAGNOSIS — I350 Nonrheumatic aortic (valve) stenosis: Secondary | ICD-10-CM | POA: Diagnosis not present

## 2022-12-04 MED ORDER — ROSUVASTATIN CALCIUM 10 MG PO TABS: 10.0000 mg | ORAL_TABLET | Freq: Every day | ORAL | 3 refills | Status: AC

## 2022-12-04 NOTE — Progress Notes (Signed)
Patient ID: Kem Alessi, male   DOB: 04-26-1935, 87 y.o.   MRN: [id number]      PCP: Dr.Rudd  HPI: Roee Manly is a 87 y.o. male who presents to the office today for a 6 month follow up cardiology evaluation.  I initially saw Mr. Kumagai in cardiology consultation in April 2016 while he was in the hospital after he experienced a brief episode of chest discomfort associated with presyncope and transient visual blurring.  Physical examination suggested a murmur of severe aortic stenosis.  This was verified by echo Doppler data, which suggested critical AS.  I performed a right and left heart catheterization 06/22/2014.  He was found to have critical aortic valve stenosis without significant coronary obstructive disease with only a smooth 20% luminal narrowing of the proximal RCA and moderate global LV dysfunction with an EF of 35-40%.  He had upper normal right heart pressures.  He underwent aortic valve replacement surgery by Dr. Cornelius Moras using a bioprosthetic tissue valve on 06/30/2014.  During his hospitalization, he was taken off all beta blockers because of bradycardia.  Ultimately, beta blocker was reinstituted.  He underwent cardioversion on 10/09/2014 after being on Coumadin anticoagulation for over a month.  He was seen on 10/25/2014 by Huey Bienenstock, which time he was found to be back in atrial flutter.  He subsequent was referred to Dr. Johney Frame who saw him on 11/29/2014.  He underwent successful atrial flutter ablation by Dr. Johney Frame on 12/19/2014.  Subsequently, he was taken off essentially all medications with the exception of aspirin.  He is not aware of any recurrent atrial flutter or atrial fibrillation.    A follow-up echo Doppler study in 06/25/2015 showed an ejection fraction of 60-65% with normal wall motion and diastolic function.  His aortic valve bioprosthesis was well-seated.  There was no stenosis.  The mean gradient was a peak gradient 15 concordant with the valve.  His right atrium was  mild moderately dilated.  He has seen Dr. Cornelius Moras for follow-up evaluation who felt he was stable from his surgical standpoint.  Mr. Sheets continues to feel well.  When I last saw him, he had an atrial bigeminal pattern.  He has had issues in the past with bradycardia. He is not on any medication except ASA 81 mg and denies any recurrent awareness of arrhythmia.  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  He denies chest pain, PND, orthopnea.  He denies presyncope or syncope.  A one-year follow-up echo Doppler study on 05/20/2016 revealed an ejection fraction at  50-55%.  His bioprosthetic aortic valve was well-seated.  There was no aortic regurgitation or perivalvular leak.  Mean gradient was 7 and peak gradient 12.  There was trivial TR and PR and his left atrium was mildly dilated.   When I saw him in November 2018 he denied any chest pain or shortness of breath, dizziness or palpitations. At that time he was asymptomatic and denied chest pain, shortness of breath, dizziness, or palpitations. He had been fixing up his house in anticipation of selling his house and ultimately moving to [facility]. Lab work which showed his LDL cholesterol had risen to 113.  Is only been taking a baby aspirin.  TSH was normal at 2.42.   When I saw him in May 2019 he remained stable.  He was found to have macrocytosis on his CBC with an MCV of 102.  Subsequent B12 and folate levels were normal.  He remains very active on his land.  He denies chest pain PND orthopnea, presyncope or syncope or palpitations.    I saw him in December 2019  and last saw him in a telemedicine visit on September 02, 2018.  At that time he continued to be active.  He lives in the [place] area.  He walks outside, does yardwork, mowing, and keeps himself very busy.  He is asymptomatic and denies chest pain PND orthopnea palpitations or any shortness of breath.  He underwent a follow-up echo Doppler study on  August 24, 2018.  This continued to show normal systolic function as well as normal diastolic function.  There was abnormal septal motion consistent with postoperative state.  There was mild biatrial enlargement.  His 25 mm Edwards bovine bioprosthesis valve was well-seated and functioning normally. The mean gradient was 5 with a peak gradient of 14, normal for the valve.   I saw him in January 2021.  [place] months there had not been many people there and he was staying safe with reference to COVID-19.  He denied palpitations, presyncope or syncope.  When I saw him in July 2021 he remained asymptomatic and was living at [facility].  At times he experienced  a rare palpitation, isolated.  He denied presyncope or syncope.  He has not had recent laboratory.  He remained active.     He underwent laboratory in August 2021 which was stable.  Potassium had normalized from an increased value in July.  I recommended he undergo an echo Doppler study for further evaluation of his AVR.  I saw him on April 09, 2020.  Since I last saw him, he has remained stable.  He continues to be active and lives full-time at [facility]  He will be establishing primary care with Dr. Lynetta Mare a new physician who joined [facility] health at [facility].  A follow-up echo Doppler study on April 09, 2020 showed normal LV function with EF at 60 to 65% with moderate concentric LVH.  He had normal diastolic parameters.  His Edwards SAPIEN valve was well-seated with an 8 mm mean gradient and peak gradient of 13.4.  He denied chest pain PND orthopnea, palpitations, or shortness of breath.    I saw him on November 12, 2020.  He continues to be asymptomatic.  He is now [facility]ps [facility].  He remains free without chest pain or shortness of breath.  He denies palpitations.  He has been taking rosuvastatin 10 mg on Monday  Wednesday Friday and Saturday.  Laboratory in July 2021 showed an LDL cholesterol at 84, triglycerides 95 total cholesterol 138 and HDL 36.    I saw him on Aug 02, 2021.  [place] continued to be asymptomatic [place]  He underwent laboratory on May 21, 2021 by Dr. Veto Kemps in [facility].  Total cholesterol was 119, HDL 34, triglycerides 122, and LDL 65.  He denies any chest pain.  He denies any shortness of breath.  There is no PND orthopnea.  He denies palpitations.  He is on aspirin 81 mg in addition to rosuvastatin 10 mg.    I last saw him on June 06, 2022. Mr. Cambron continued to be asymptomatic.  [place]  He is outside most of the day.  He underwent a 2D echo Doppler study this morning.  When I saw him in the office today, the result was still pending, but subsequent to his visit this finalized and demonstrated normal systolic function with EF 60 to 65%.  There was significant biatrial enlargement.  He had a well-seated and normal functioning bioprosthetic aortic valve with mean gradient of 11.  He had recently undergone laboratory on June 02, 2022 which showed hemoglobin A1c excellent at 5.0.  CBC was stable though MCV was minimally increased at 101.  Chemistry was normal although potassium was upper normal at 5.0.  LP(a) was increased at 139.9.  During that evaluation I discussed his elevated LP(a) as an independent risk factor which is genetic and is positive at approximately 20% of the population.  I suggested more aggressive lipid management and that he increase rosuvastatin to 20 mg to achieve LDL less than 50 if at all possible.  Since I last saw him, he has remained entirely asymptomatic and denies chest pain or shortness of breath.  He remains very active.  Apparently he did not tolerate the increase rosuvastatin and stated it gave him "brain fog. "As a result, he  reduced his rosuvastatin back down to 10 mg which she has been tolerating.  He underwent repeat laboratory by his primary provider which showed total cholesterol 124, triglycerides 69, HDL 41, VLDL 14 teen, and LDL 69.  He presents for evaluation.    Past Medical History:  Diagnosis Date   Aortic stenosis, severe    Atrial flutter, post-operative 07/27/2014   typical    Chronic diastolic congestive heart failure (HCC)    S/P aortic valve replacement with bioprosthetic valve 06/30/2014   25 mm [facility] Ease bovine pericardial tissue valve   Sinus bradycardia     Past Surgical History:  Procedure Laterality Date   AORTIC VALVE REPLACEMENT N/A 06/30/2014   Procedure: AORTIC VALVE REPLACEMENT (AVR);  Surgeon: Purcell Nails, MD;  Location: [facility] OR;  Service: Open Heart Surgery;  Laterality: N/A;   APPENDECTOMY     CARDIOVERSION N/A 10/09/2014   Procedure: CARDIOVERSION;  Surgeon: Lennette Bihari, MD;  Location: [facility] ENDOSCOPY;  Service: Cardiovascular;  Laterality: N/A;   CATARACT EXTRACTION Bilateral ? 2013    & 2014   ELECTROPHYSIOLOGIC STUDY N/A 12/19/2014   CTI ablation by Dr Johney Frame   HAND SURGERY     INTRAOPERATIVE TRANSESOPHAGEAL ECHOCARDIOGRAM N/A 06/30/2014   Procedure: INTRAOPERATIVE TRANSESOPHAGEAL ECHOCARDIOGRAM;  Surgeon: Purcell Nails, MD;  Location: [facility] OR;  Service: Open Heart Surgery;  Laterality: N/A;   LEFT AND RIGHT HEART CATHETERIZATION WITH CORONARY ANGIOGRAM N/A 06/22/2014   Procedure: LEFT AND RIGHT HEART CATHETERIZATION WITH CORONARY ANGIOGRAM;  Surgeon: Lennette Bihari, MD;  Location: [facility] CATH LAB;  Service: Cardiovascular;  Laterality: N/A;    No Known Allergies  Current Outpatient Medications  Medication Sig Dispense Refill   aspirin EC 81 MG tablet Take 81 mg by mouth daily.     rosuvastatin (CRESTOR) 10 MG tablet Take 1 tablet (10 mg total) by mouth daily. 90 tablet 3   No current facility-administered medications for this visit.    Social History    Socioeconomic History   Marital status: Married    Spouse name: Not on file   Number of children: Not on file   Years of education: Not on file   Highest education level: Not on file  Occupational History   Not on file  Tobacco Use   Smoking status: Never   Smokeless tobacco: Never   Tobacco comments:    smoked in college  Vaping Use   Vaping status: Never Used  Substance and Sexual Activity   Alcohol use: No    Comment: occ    Drug use: No   Sexual activity: Yes  Other Topics Concern   Not on file  Social History Narrative   Pt lives in [place] with spouse.  Retired Conservation officer, historic buildings.   Social Determinants of Health   Financial Resource Strain: Not on file  Food Insecurity: Not on file  Transportation Needs: Not on file  Physical Activity: Not on file  Stress: Not on file  Social Connections: Not on file  Intimate Partner Violence: Not on file    Family History  Problem Relation Age of Onset   Stroke Mother    Hypertension Mother    Cancer Father     ROS General: Negative; No fevers, chills, or night sweats HEENT: Negative; No changes in vision or hearing, sinus congestion, difficulty swallowing Pulmonary: Negative; No cough, wheezing, shortness of breath, hemoptysis Cardiovascular: See HPI:  GI: Negative; No nausea, vomiting, diarrhea, or abdominal pain GU: Negative; No dysuria, hematuria, or difficulty voiding Musculoskeletal: Negative; no myalgias, joint pain, or weakness Hematologic: Negative; no easy bruising, bleeding Endocrine: Negative; no heat/cold intolerance; no diabetes, Neuro: Negative; no changes in balance, headaches Skin: Negative; No rashes or skin lesions Psychiatric: Negative; No behavioral problems, depression Sleep: Negative; No snoring,  daytime sleepiness, hypersomnolence, bruxism, restless legs, hypnogognic hallucinations. Other comprehensive 14 point system review is negative   Physical Exam BP (!) 149/80 (BP Location:  Left Arm, Patient Position: Sitting, Cuff Size: Normal)   Pulse (!) 57   Ht 6\' 1"  (1.854 m)   Wt 185 lb 12.8 oz (84.3 kg)   SpO2 99%   BMI 24.51 kg/m    Repeat blood pressure by me was 136/80  Wt Readings from Last 3 Encounters:  12/04/22 185 lb 12.8 oz (84.3 kg)  06/06/22 192 lb 9.6 oz (87.4 kg)  08/02/21 198 lb 6.4 oz (90 kg)    General: Alert, oriented, no distress.  Appears younger than stated age Skin: normal turgor, no rashes, warm and dry HEENT: Normocephalic, atraumatic. Pupils equal round and reactive to light; sclera anicteric; extraocular muscles intact;  Nose without nasal septal hypertrophy Mouth/Parynx benign; Mallinpatti scale 2 Neck: No JVD, no carotid bruits; normal carotid upstroke Lungs: clear to ausculatation and percussion; no wheezing or rales Chest wall: without tenderness to palpitation Heart: PMI not displaced, RRR, s1 s2 normal, 1-2/6 systolic murmur, no diastolic murmur, no rubs, gallops, thrills, or heaves Abdomen: soft, nontender; no hepatosplenomehaly, BS+; abdominal aorta nontender and not dilated by palpation. Back: no CVA tenderness Pulses 2+ Musculoskeletal: full range of motion, normal strength, no joint deformities Extremities: no clubbing cyanosis or edema, Homan's sign negative  Neurologic: grossly nonfocal; Cranial nerves grossly wnl Psychologic: Normal mood and affect    EKG Interpretation Date/Time:  Thursday December 04 2022 14:00:18 EDT Ventricular Rate:  57 PR Interval:  234 QRS Duration:  92 QT Interval:  456 QTC Calculation: 443 R Axis:   1  Text Interpretation: Sinus bradycardia with marked sinus arrhythmia with 1st degree A-V block with occasional Premature ventricular complexes Low voltage QRS Cannot rule out Anterior infarct (cited on or before 29-Dec-2014) When compared with ECG of 29-Dec-2014 10:33, Premature ventricular complexes are now Present Confirmed  by Nicki Guadalajara ([id number]) on 12/04/2022 2:31:50 PM     June 06, 2022 ECG (independently read by me): Low atrial and sinus rhythm at 64  May 19,2023 ECG (independently read by me): Sinus bradycardia at 54, 1st degree AV block PR ; .isolated PVC,PRWP  November 12, 2020 ECG (independently read by me): Probable low atrial rhythm at 53,  PAC, low voltage  April 09, 2020 ECG (independently read by me): Sinus rhythm with !st degree AV block; PRWP  July 2021 ECG (independently read by me): Sinus bradycardia 55 bpm, first-degree AV block with a PR of 218 ms.  PACs.  R wave progression, unchanged.  T wave abnormality inferiorly.  January 2021 ECG (independently read by me): Sinus rhythm with an isolated PVC and occasional PACs.  December 2019 ECG (independently read by me): Sinus bradycardia 54 bpm.  First-degree AV block with a PR interval at 234 ms.  Poor anterior R wave progression  May 2019 ECG (independently read by me): Sinus bradycardia at 57 bpm.  First-degree AV block with a PR interval at 232 ms.  PAC.  QS complex.  November 2018 ECG (independently read by me): Sinus bradycardia 54 bpm with sinus arrhythmia.  First degree AV block with a PR interval at 214 ms.  PAC.  Poor anterior R-wave progression V1 through V4.  March 2018 ECG (independently read by me): Sinus bradycardia with first degree AV block.  PAC is in an atrial bigeminal rhythm.    September 2017 ECG (independently read by me): Sinus bradycardia 53 bpm.  First-degree AV block.  There are PACs and atrial bigeminal pattern.  He has poor anterior R-wave voltage.  January 2017 ECG (independently read by me): Sinus bradycardia 50 bpm with first-degree AV block with a PR interval at 234 ms and evidence for occasional PAC.  September 2016 ECG (independently read by me): Atrial flutter with 4:1 block at 64 bpm.  09/11/2014 ECG (independently read by me): Atrial flutter with 41 block.  One isolated PVC.  Ventricular rate 67 bpm.  Nonspecific ST-T change  Prior ECG (independently read by me):  Underlying atrial flutter with a ventricular rate at 88 with frequent PVCs in a bigeminal pattern transiently.  LABS:     Latest Ref Rng & Units 06/02/2022    9:16 AM 10/27/2019   12:14 PM 10/14/2019   12:04 PM  BMP  Glucose 70 - 99 mg/dL 95  85  96   BUN 8 - 27 mg/dL 14  13  10    Creatinine 0.76 - 1.27 mg/dL 1.91  4.78  2.95   BUN/Creat Ratio 10 - 24 12  12  9    Sodium 134 - 144 mmol/L 141  144  145   Potassium 3.5 - 5.2 mmol/L 5.0  4.6  5.7   Chloride 96 - 106 mmol/L 105  108  100   CO2 20 - 29 mmol/L 24  22  25    Calcium 8.6 - 10.2 mg/dL 9.8  9.1  9.7        Latest Ref Rng & Units 06/02/2022    9:16 AM 10/27/2019   12:14 PM 10/14/2019   12:04 PM  Hepatic Function  Total Protein 6.0 - 8.5 g/dL 7.5  6.8  7.8   Albumin 3.7 - 4.7 g/dL 4.4  4.3  4.6   AST 0 - 40 IU/L 17  14  16    ALT 0 - 44 IU/L 12  12  12    Alk Phosphatase 44 -  121 IU/L 80  77  84   Total Bilirubin 0.0 - 1.2 mg/dL 0.9  0.5  1.0        Latest Ref Rng & Units 06/02/2022    9:16 AM 10/14/2019   12:04 PM 04/11/2019   12:16 PM  CBC  WBC 3.4 - 10.8 x10E3/uL 6.5  7.0  6.7   Hemoglobin 13.0 - 17.7 g/dL 36.6  44.0  34.7   Hematocrit 37.5 - 51.0 % 41.8  43.6  40.9   Platelets 150 - 450 x10E3/uL 192  215  209    Lab Results  Component Value Date   MCV 101 (H) 06/02/2022   MCV 101 (H) 10/14/2019   MCV 100 (H) 04/11/2019    Lab Results  Component Value Date   TSH 1.830 06/02/2022    BNP    Component Value Date/Time   BNP 1,282.9 (H) 06/19/2014 1630    ProBNP No results found for: "PROBNP"   Lipid Panel     Component Value Date/Time   CHOL 116 06/02/2022 0916   TRIG 87 06/02/2022 0916   HDL 35 (L) 06/02/2022 0916   CHOLHDL 3.3 06/02/2022 0916   CHOLHDL 4.8 06/11/2016 1120   VLDL 19 06/11/2016 1120   LDLCALC 64 06/02/2022 0916     RADIOLOGY: No results found.   ECHO: 06/06/2022  1. Left ventricular ejection fraction, by estimation, is 60 to 65%. The  left ventricle has normal function. The  left ventricle has no regional  wall motion abnormalities. Left ventricular diastolic parameters are  indeterminate.   2. Right ventricular systolic function is normal. The right ventricular  size is normal.   3. Left atrial size was severely dilated.   4. Right atrial size was severely dilated.   5. The mitral valve is normal in structure. Trivial mitral valve  regurgitation. No evidence of mitral stenosis.   6. The aortic valve has been repaired/replaced. Aortic valve  regurgitation is not visualized. No aortic stenosis is present. Echo  findings are consistent with normal structure and function of the aortic  valve prosthesis. Aortic valve area, by VTI  measures 1.14 cm. Aortic valve mean gradient measures 11.0 mmHg. Aortic  valve Vmax measures 2.34 m/s.   7. Aortic dilatation noted. There is borderline dilatation of the aortic  root, measuring 36 mm.   8. The inferior vena cava is normal in size with greater than 50%  respiratory variability, suggesting right atrial pressure of 3 mmHg.   IMPRESSION:  1. S/P aortic valve replacement with bioprosthetic valve with 25 mm Edwards SAPIEN bioprosthetic valve June 30, 2014   2. h/o Aortic stenosis, severe   3. Typical atrial flutter [facility]): Status post ablation December 19, 2014, Dr. Johney Frame   4. Hyperlipidemia with target LDL less than 50   5. Elevated Lp(a)     ASSESSMENT AND PLAN: Mr. Kairi Ocasio is a young appearing 87 year-old gentleman who was found to have severe symptomatic critical aortic valve stenosis after he presented to the hospital following a presyncopal spell in 2016.  He successfully underwent aortic valve replacement surgery on 06/30/2014 with a 25 mm [facility] Ease bovine pericardial tissue valve.  He developed atrial flutter in October 2016 and underwent successful ablation.  He was taken off warfarin therapy as well as beta blocker therapy.  He continues to be on aspirin alone.  He has done exceptionally well since  his AVR and has remained asymptomatic.  [place], lifts lumbar and keeps extremely busy.  His blood pressure today by me was 136/80.  He has not had any recurrent atrial flutter and is status post ablation by Dr. Johney Frame in 2016.  ECG today showed sinus bradycardia with sinus arrhythmia, first-degree AV block and PVC.  His last echo Doppler study on June 06, 2022 showed normal LV function with EF 60 to 65% without wall motion abnormalities.  He had a stable bioprosthetic aortic valve replacement with a mean gradient of 11 mm.  He had significant biatrial enlargement.  He apparently did not tolerate the increased dose of rosuvastatin and felt he developed "brain fog "on the 20 mg dose.  He is now back on 10 mg.  He has elevation of LP(a) at 139.9.  Ideally I have suggested LDL cholesterol preferably less than 50.  He seems to be tolerating rosuvastatin at 10 mg..  We discussed potential future Zetia addition if necessary.  Presently I will continue him on his current regimen of rosuvastatin.  I will see him for follow-up in April 2025.     Lennette Bihari, MD, [facility]  12/06/2022 2:26 PM

## 2022-12-04 NOTE — Patient Instructions (Signed)
Medication Instructions:  No changes *If you need a refill on your cardiac medications before your next appointment, please call your pharmacy*   Lab Work: none If you have labs (blood work) drawn today and your tests are completely normal, you will receive your results only by: MyChart Message (if you have MyChart) OR A paper copy in the mail If you have any lab test that is abnormal or we need to change your treatment, we will call you to review the results.   Testing/Procedures: none   Follow-Up: At [facility], you and your health needs are our priority.  As part of our continuing mission to provide you with exceptional heart care, we have created designated Provider Care Teams.  These Care Teams include your primary Cardiologist (physician) and Advanced Practice Providers (APPs -  Physician Assistants and Nurse Practitioners) who all work together to provide you with the care you need, when you need it.  We recommend signing up for the patient portal called "MyChart".  Sign up information is provided on this After Visit Summary.  MyChart is used to connect with patients for Virtual Visits (Telemedicine).  Patients are able to view lab/test results, encounter notes, upcoming appointments, etc.  Non-urgent messages can be sent to your provider as well.   To learn more about what you can do with MyChart, go to [email or web address].    Your next appointment:   6 month(s)  Provider:   Dr. Tresa Endo

## 2022-12-06 ENCOUNTER — Encounter: Payer: Self-pay | Admitting: Cardiovascular Disease

## 2023-06-03 ENCOUNTER — Ambulatory Visit: Payer: Medicare Other | Admitting: Cardiovascular Disease

## 2023-08-18 ENCOUNTER — Encounter: Payer: Self-pay | Admitting: Cardiovascular Disease

## 2023-08-18 ENCOUNTER — Ambulatory Visit: Attending: Cardiovascular Disease | Admitting: Cardiovascular Disease

## 2023-08-18 VITALS — BP 126/76 | HR 59 | Ht 73.0 in | Wt 184.0 lb

## 2023-08-18 DIAGNOSIS — E785 Hyperlipidemia, unspecified: Secondary | ICD-10-CM

## 2023-08-18 DIAGNOSIS — Z953 Presence of xenogenic heart valve: Secondary | ICD-10-CM | POA: Diagnosis present

## 2023-08-18 DIAGNOSIS — I483 Typical atrial flutter: Secondary | ICD-10-CM | POA: Diagnosis present

## 2023-08-18 DIAGNOSIS — E7841 Elevated Lipoprotein(a): Secondary | ICD-10-CM | POA: Diagnosis present

## 2023-08-18 DIAGNOSIS — I5032 Chronic diastolic (congestive) heart failure: Secondary | ICD-10-CM | POA: Diagnosis present

## 2023-08-18 DIAGNOSIS — I350 Nonrheumatic aortic (valve) stenosis: Secondary | ICD-10-CM | POA: Diagnosis present

## 2023-08-18 DIAGNOSIS — M6208 Separation of muscle (nontraumatic), other site: Secondary | ICD-10-CM | POA: Diagnosis present

## 2023-08-18 NOTE — Patient Instructions (Addendum)
 Medication Instructions:  No changes *If you need a refill on your cardiac medications before your next appointment, please call your pharmacy*  Lab Work: No labs  Testing/Procedures: Your physician has requested that you have an echocardiogram. Echocardiography is a painless test that uses sound waves to create images of your heart. It provides your doctor with information about the size and shape of your heart and how well your heart's chambers and valves are working. This procedure takes approximately one hour. There are no restrictions for this procedure. Please do NOT wear cologne, perfume, aftershave, or lotions (deodorant is allowed). Please arrive 15 minutes prior to your appointment time.  Please note: We ask at that you not bring children with you during ultrasound (echo/ vascular) testing. Due to room size and safety concerns, children are not allowed in the ultrasound rooms during exams. Our front office staff cannot provide observation of children in our lobby area while testing is being conducted. An adult accompanying a patient to their appointment will only be allowed in the ultrasound room at the discretion of the ultrasound technician under special circumstances. We apologize for any inconvenience.  Follow-Up: At [facility], you and your health needs are our priority.  As part of our continuing mission to provide you with exceptional heart care, our providers are all part of one team.  This team includes your primary Cardiologist (physician) and Advanced Practice Providers or APPs (Physician Assistants and Nurse Practitioners) who all work together to provide you with the care you need, when you need it.  Your next appointment:   1 year(s)  Provider:   Arnoldo Lapping MD   We recommend signing up for the patient portal called "MyChart".  Sign up information is provided on this After Visit Summary.  MyChart is used to connect with patients for Virtual Visits  (Telemedicine).  Patients are able to view lab/test results, encounter notes, upcoming appointments, etc.  Non-urgent messages can be sent to your provider as well.   To learn more about what you can do with MyChart, go to [email or web address].

## 2023-08-18 NOTE — Progress Notes (Signed)
 Patient ID: Erik [name], male   DOB: [birth year], 88 y.o.   MRN: [id number]      PCP: Dr.Rudd  HPI: Erik [name] is a 88 y.o. male who presents to the office today for a 6 month follow up cardiology evaluation.  I initially saw Erik [name] for cardiology consultation in April 2016 while he was in the hospital after experiencing a brief episode of chest discomfort associated with presyncope and transient visual blurring.  Physical examination suggested a murmur of severe aortic stenosis.  This was verified by echo Doppler data, which suggested critical AS.  I performed a right and left heart catheterization 06/22/2014.  He was found to have critical aortic valve stenosis without significant coronary obstructive disease with only a smooth 20% luminal narrowing of the proximal RCA and moderate global LV dysfunction with an EF of 35-40%.  He had upper normal right heart pressures.  He underwent aortic valve replacement surgery by Dr. Alva Jewels using a bioprosthetic tissue valve on 06/30/2014.  During his hospitalization, he was taken off all beta blockers because of bradycardia.  Ultimately, beta blocker was reinstituted.  He underwent cardioversion on 10/09/2014 after being on Coumadin  anticoagulation for over a month.  He was seen on 10/25/2014 by Stanford Earl, which time he was found to be back in atrial flutter.  He subsequent was referred to Dr. Nunzio Belch who saw him on 11/29/2014.  He underwent successful atrial flutter ablation by Dr. Nunzio Belch on 12/19/2014.  Subsequently, he was taken off essentially all medications with the exception of aspirin .  He is not aware of any recurrent atrial flutter or atrial fibrillation.    A follow-up echo Doppler study in 06/25/2015 showed an ejection fraction of 60-65% with normal wall motion and diastolic function.  His aortic valve bioprosthesis was well-seated.  There was no stenosis.  The mean gradient was a peak gradient 15 concordant with the valve.  His right atrium was  mild moderately dilated.  He has seen Dr. Alva Jewels for follow-up evaluation who felt he was stable from his surgical standpoint.  Erik [name] continues to feel well.  When I last saw him, he had an atrial bigeminal pattern.  He has had issues in the past with bradycardia. He is not on any medication except ASA 81 mg and denies any recurrent awareness of arrhythmia.  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  He denies chest pain, PND, orthopnea.  He denies presyncope or syncope.  A one-year follow-up echo Doppler study on 05/20/2016 revealed an ejection fraction at  50-55%.  His bioprosthetic aortic valve was well-seated.  There was no aortic regurgitation or perivalvular leak.  Mean gradient was 7 and peak gradient 12.  There was trivial TR and PR and his left atrium was mildly dilated.   When I saw him in November 2018 he denied any chest pain or shortness of breath, dizziness or palpitations. At that time he was asymptomatic and denied chest pain, shortness of breath, dizziness, or palpitations. He had been fixing up his house in anticipation of selling his house and ultimately moving to [facility]. Lab work which showed his LDL cholesterol had risen to 113.  Is only been taking a baby aspirin .  TSH was normal at 2.42.   When I saw him in May 2019 he remained stable.  He was found to have macrocytosis on his CBC with an MCV of 102.  Subsequent B12 and folate levels were normal.  He remains very active on his land.  He denies chest pain PND orthopnea, presyncope or syncope or palpitations.    I saw him in December 2019  and last saw him in a telemedicine visit on September 02, 2018.  At that time he continued to be active.  He lives in the [place] area.  He walks outside, does yardwork, mowing, and keeps himself very busy.  He is asymptomatic and denies chest pain PND orthopnea palpitations or any shortness of breath.  He underwent a follow-up echo Doppler study on  August 24, 2018.  This continued to show normal systolic function as well as normal diastolic function.  There was abnormal septal motion consistent with postoperative state.  There was mild biatrial enlargement.  His 25 mm Edwards bovine bioprosthesis valve was well-seated and functioning normally. The mean gradient was 5 with a peak gradient of 14, normal for the valve.   I saw him in January 2021.  [place] winter months there had not been many people there and he was staying safe with reference to COVID-19.  He denied palpitations, presyncope or syncope.  When I saw him in July 2021 he remained asymptomatic and was living at [facility].  At times he experienced  a rare palpitation, isolated.  He denied presyncope or syncope.  He has not had recent laboratory.  He remained active.     He underwent laboratory in August 2021 which was stable.  Potassium had normalized from an increased value in July.  I recommended he undergo an echo Doppler study for further evaluation of his AVR.  I saw him on April 09, 2020.  Since I last saw him, he has remained stable.  He continues to be active and lives full-time at [facility].  He will be establishing primary care with Dr. Albert Almas a new physician who joined [facility] health at [facility].  A follow-up echo Doppler study on April 09, 2020 showed normal LV function with EF at 60 to 65% with moderate concentric LVH.  He had normal diastolic parameters.  His Edwards SAPIEN valve was well-seated with an 8 mm mean gradient and peak gradient of 13.4.  He denied chest pain PND orthopnea, palpitations, or shortness of breath.    I saw him on November 12, 2020.  He continues to be asymptomatic.  He [facility] [place] .  He remains free without chest pain or shortness of breath.  He denies palpitations.  He has been taking rosuvastatin  10 mg on Monday  Wednesday Friday and Saturday.  Laboratory in July 2021 showed an LDL cholesterol at 84, triglycerides 95 total cholesterol 138 and HDL 36.    I saw him on Aug 02, 2021.  [place] continued to be asymptomatic [place].  He underwent laboratory on May 21, 2021 by Dr. Therese Flash in [place] .  Total cholesterol was 119, HDL 34, triglycerides 122, and LDL 65.  He denies any chest pain.  He denies any shortness of breath.  There is no PND orthopnea.  He denies palpitations.  He is on aspirin  81 mg in addition to rosuvastatin  10 mg.    I saw him on June 06, 2022. Mr. Geiler continued to be asymptomatic.  [place]  He is outside most of the day.  He underwent a 2D echo Doppler study this morning.  When I saw him in the office today, the result was still pending, but subsequent to his visit this finalized and demonstrated normal systolic function with EF 60 to 65%.  There was significant biatrial enlargement.  He had a well-seated and normal functioning bioprosthetic aortic valve with mean gradient of 11.  He had recently undergone laboratory on June 02, 2022 which showed hemoglobin A1c excellent at 5.0.  CBC was stable though MCV was minimally increased at 101.  Chemistry was normal although potassium was upper normal at 5.0.  LP(a) was increased at 139.9.  During that evaluation I discussed his elevated LP(a) as an independent risk factor which is genetic and is positive at approximately 20% of the population.  I suggested more aggressive lipid management and that he increase rosuvastatin  to 20 mg to achieve LDL less than 50 if at all possible.  I last saw him on December 04, 2022 at which time he remained  asymptomatic and denied any chest pain or shortness of breath.  He remains very active.  Apparently he did not tolerate the increase rosuvastatin  and stated it gave him "brain fog."  As a result, he reduced his rosuvastatin  back down to 10 mg which she has been tolerating.  He underwent repeat laboratory by his primary provider which showed total cholesterol 124, triglycerides 69, HDL 41, VLDL 114 and LDL 69.    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  He cuts many people's grass.  He denies any chest pain.  He denies any shortness of breath.  He is unaware of any presyncope or syncope.  He denies any palpitations.  He continues to be on aspirin  81 mg and takes rosuvastatin  10 mg daily.  He now sees Gayl Katos, MD for primary care at [facility].  He presents for follow-up evaluation.    Past Medical History:  Diagnosis Date   Aortic stenosis, severe    Atrial flutter, post-operative 07/27/2014   typical    Chronic diastolic congestive heart failure (HCC)    S/P aortic valve replacement with bioprosthetic valve 06/30/2014   25 mm [facility] Ease bovine pericardial tissue valve   Sinus bradycardia     Past Surgical History:  Procedure Laterality Date   AORTIC VALVE REPLACEMENT N/A 06/30/2014   Procedure: AORTIC VALVE REPLACEMENT (AVR);  Surgeon: Gardenia Jump, MD;  Location: [facility] OR;  Service: Open Heart Surgery;  Laterality: N/A;   APPENDECTOMY     CARDIOVERSION N/A 10/09/2014   Procedure: CARDIOVERSION;  Surgeon: Millicent Ally, MD;  Location: [facility] ENDOSCOPY;  Service: Cardiovascular;  Laterality: N/A;   CATARACT EXTRACTION Bilateral ? 2013    & 2014   ELECTROPHYSIOLOGIC STUDY N/A 12/19/2014   CTI ablation by Dr Nunzio Belch   HAND SURGERY     INTRAOPERATIVE TRANSESOPHAGEAL ECHOCARDIOGRAM N/A 06/30/2014   Procedure: INTRAOPERATIVE TRANSESOPHAGEAL ECHOCARDIOGRAM;  Surgeon: Gardenia Jump, MD;  Location: [facility] OR;  Service: Open Heart Surgery;  Laterality: N/A;   LEFT AND RIGHT HEART CATHETERIZATION WITH CORONARY ANGIOGRAM N/A 06/22/2014   Procedure: LEFT AND RIGHT HEART CATHETERIZATION WITH CORONARY ANGIOGRAM;  Surgeon: Millicent Ally,  MD;  Location: [facility] CATH LAB;  Service: Cardiovascular;  Laterality: N/A;    No Known Allergies  Current Outpatient Medications  Medication Sig Dispense Refill   aspirin  EC  81 MG tablet Take 81 mg by mouth daily.     Multiple Vitamin (MULTIVITAMIN) tablet Take 1 tablet by mouth daily.     rosuvastatin  (CRESTOR ) 10 MG tablet Take 1 tablet (10 mg total) by mouth daily. 90 tablet 3   No current facility-administered medications for this visit.    Social History   Socioeconomic History   Marital status: Married    Spouse name: Not on file   Number of children: Not on file   Years of education: Not on file   Highest education level: Not on file  Occupational History   Not on file  Tobacco Use   Smoking status: Never   Smokeless tobacco: Never   Tobacco comments:    smoked in college  Vaping Use   Vaping status: Never Used  Substance and Sexual Activity   Alcohol use: No    Comment: occ    Drug use: No   Sexual activity: Yes  Other Topics Concern   Not on file  Social History Narrative   Pt lives in [place] with spouse.  Retired Conservation officer, historic buildings.   Social Drivers of Corporate investment banker Strain: Not on file  Food Insecurity: Not on file  Transportation Needs: Not on file  Physical Activity: Not on file  Stress: Not on file  Social Connections: Not on file  Intimate Partner Violence: Not on file    Family History  Problem Relation Age of Onset   Stroke Mother    Hypertension Mother    Cancer Father     ROS General: Negative; No fevers, chills, or night sweats HEENT: Negative; No changes in vision or hearing, sinus congestion, difficulty swallowing Pulmonary: Negative; No cough, wheezing, shortness of breath, hemoptysis Cardiovascular: See HPI:  GI: Negative; No nausea, vomiting, diarrhea, or abdominal pain GU: Negative; No dysuria, hematuria, or difficulty voiding Musculoskeletal: Negative; no myalgias, joint pain, or weakness Hematologic:  Negative; no easy bruising, bleeding Endocrine: Negative; no heat/cold intolerance; no diabetes, Neuro: Negative; no changes in balance, headaches Skin: Negative; No rashes or skin lesions Psychiatric: Negative; No behavioral problems, depression Sleep: Negative; No snoring,  daytime sleepiness, hypersomnolence, bruxism, restless legs, hypnogognic hallucinations. Other comprehensive 14 point system review is negative   Physical Exam BP 126/76   Pulse (!) 59   Ht 6\' 1"  (1.854 m)   Wt 184 lb (83.5 kg)   SpO2 97%   BMI 24.28 kg/m    Repeat blood pressure by me was 126/76 she is  Wt Readings from Last 3 Encounters:  08/18/23 184 lb (83.5 kg)  12/04/22 185 lb 12.8 oz (84.3 kg)  06/06/22 192 lb 9.6 oz (87.4 kg)   General: Alert, oriented, no distress.  Appears younger than stated age. Skin: normal turgor, no rashes, warm and dry HEENT: Normocephalic, atraumatic. Pupils equal round and reactive to light; sclera anicteric; extraocular muscles intact;  Nose without nasal septal hypertrophy Mouth/Parynx benign; Mallinpatti scale Neck: No JVD, no carotid bruits; normal carotid upstroke Lungs: clear to ausculatation and percussion; no wheezing or rales Chest wall: without tenderness to palpitation Heart: PMI not displaced, RRR, s1 s2 normal, 1-2/6 systolic murmur, no diastolic murmur, no rubs, gallops, thrills, or heaves Abdomen: Diastases recti, soft, nontender; no hepatosplenomehaly, BS+; abdominal aorta nontender and not dilated by palpation. Back: no CVA tenderness Pulses 2+ Musculoskeletal: full range of motion, normal strength, no joint deformities Extremities: no clubbing cyanosis or edema, Homan's sign negative  Neurologic: grossly nonfocal; Cranial nerves grossly wnl Psychologic:  Normal mood and affect    EKG Interpretation Date/Time:  Tuesday August 18 2023 13:42:54 EDT Ventricular Rate:  59 PR Interval:  242 QRS Duration:  114 QT Interval:  466 QTC Calculation: 461 R  Axis:   -14  Text Interpretation: Sinus bradycardia with 1st degree A-V block with Premature atrial complexes with Abberant conduction Incomplete right bundle branch block Lateral infarct (cited on or before 29-Dec-2014) Cannot rule out Inferior infarct , age undetermined When compared with ECG of 04-Dec-2022 14:00, Premature ventricular complexes are no longer Present Abberant conduction is now Present Incomplete right bundle branch block is now Present Questionable change in initial forces of Anterolateral leads Confirmed by Magnus Schuller ([id number]) on 08/18/2023 2:11:08 PM     December 04, 2022 ECG (independently read by me): Sinus bradycardia at 57 with sinus arrythmia   June 06, 2022 ECG (independently read by me): Low atrial and sinus rhythm at 64  May 19,2023 ECG (independently read by me): Sinus bradycardia at 54, 1st degree AV block PR ; .isolated PVC,PRWP  November 12, 2020 ECG (independently read by me): Probable low atrial rhythm at 53,  PAC, low voltage  April 09, 2020 ECG (independently read by me): Sinus rhythm with !st degree AV block; PRWP  July 2021 ECG (independently read by me): Sinus bradycardia 55 bpm, first-degree AV block with a PR of 218 ms.  PACs.  R wave progression, unchanged.  T wave abnormality inferiorly.  January 2021 ECG (independently read by me): Sinus rhythm with an isolated PVC and occasional PACs.  December 2019 ECG (independently read by me): Sinus bradycardia 54 bpm.  First-degree AV block with a PR interval at 234 ms.  Poor anterior R wave progression  May 2019 ECG (independently read by me): Sinus bradycardia at 57 bpm.  First-degree AV block with a PR interval at 232 ms.  PAC.  QS complex.  November 2018 ECG (independently read by me): Sinus bradycardia 54 bpm with sinus arrhythmia.  First degree AV block with a PR interval at 214 ms.  PAC.  Poor anterior R-wave progression V1 through V4.  March 2018 ECG (independently read by me): Sinus  bradycardia with first degree AV block.  PAC is in an atrial bigeminal rhythm.    September 2017 ECG (independently read by me): Sinus bradycardia 53 bpm.  First-degree AV block.  There are PACs and atrial bigeminal pattern.  He has poor anterior R-wave voltage.  January 2017 ECG (independently read by me): Sinus bradycardia 50 bpm with first-degree AV block with a PR interval at 234 ms and evidence for occasional PAC.  September 2016 ECG (independently read by me): Atrial flutter with 4:1 block at 64 bpm.  09/11/2014 ECG (independently read by me): Atrial flutter with 41 block.  One isolated PVC.  Ventricular rate 67 bpm.  Nonspecific ST-T change  Prior ECG (independently read by me): Underlying atrial flutter with a ventricular rate at 88 with frequent PVCs in a bigeminal pattern transiently.  LABS:     Latest Ref Rng & Units 06/02/2022    9:16 AM 10/27/2019   12:14 PM 10/14/2019   12:04 PM  BMP  Glucose 70 - 99 mg/dL 95  85  96   BUN 8 - 27 mg/dL 14  13  10    Creatinine 0.76 - 1.27 mg/dL 1.91  4.78  2.95   BUN/Creat Ratio 10 - 24 12  12  9    Sodium 134 - 144 mmol/L 141  144  145  Potassium 3.5 - 5.2 mmol/L 5.0  4.6  5.7   Chloride 96 - 106 mmol/L 105  108  100   CO2 20 - 29 mmol/L 24  22  25    Calcium  8.6 - 10.2 mg/dL 9.8  9.1  9.7        Latest Ref Rng & Units 06/02/2022    9:16 AM 10/27/2019   12:14 PM 10/14/2019   12:04 PM  Hepatic Function  Total Protein 6.0 - 8.5 g/dL 7.5  6.8  7.8   Albumin  3.7 - 4.7 g/dL 4.4  4.3  4.6   AST 0 - 40 IU/L 17  14  16    ALT 0 - 44 IU/L 12  12  12    Alk Phosphatase 44 - 121 IU/L 80  77  84   Total Bilirubin 0.0 - 1.2 mg/dL 0.9  0.5  1.0        Latest Ref Rng & Units 06/02/2022    9:16 AM 10/14/2019   12:04 PM 04/11/2019   12:16 PM  CBC  WBC 3.4 - 10.8 x10E3/uL 6.5  7.0  6.7   Hemoglobin 13.0 - 17.7 g/dL 65.7  84.6  96.2   Hematocrit 37.5 - 51.0 % 41.8  43.6  40.9   Platelets 150 - 450 x10E3/uL 192  215  209    Lab Results   Component Value Date   MCV 101 (H) 06/02/2022   MCV 101 (H) 10/14/2019   MCV 100 (H) 04/11/2019    Lab Results  Component Value Date   TSH 1.830 06/02/2022    BNP    Component Value Date/Time   BNP 1,282.9 (H) 06/19/2014 1630    ProBNP No results found for: "PROBNP"   Lipid Panel     Component Value Date/Time   CHOL 116 06/02/2022 0916   TRIG 87 06/02/2022 0916   HDL 35 (L) 06/02/2022 0916   CHOLHDL 3.3 06/02/2022 0916   CHOLHDL 4.8 06/11/2016 1120   VLDL 19 06/11/2016 1120   LDLCALC 64 06/02/2022 0916     RADIOLOGY: No results found.   ECHO: 06/06/2022  1. Left ventricular ejection fraction, by estimation, is 60 to 65%. The  left ventricle has normal function. The left ventricle has no regional  wall motion abnormalities. Left ventricular diastolic parameters are  indeterminate.   2. Right ventricular systolic function is normal. The right ventricular  size is normal.   3. Left atrial size was severely dilated.   4. Right atrial size was severely dilated.   5. The mitral valve is normal in structure. Trivial mitral valve  regurgitation. No evidence of mitral stenosis.   6. The aortic valve has been repaired/replaced. Aortic valve  regurgitation is not visualized. No aortic stenosis is present. Echo  findings are consistent with normal structure and function of the aortic  valve prosthesis. Aortic valve area, by VTI  measures 1.14 cm. Aortic valve mean gradient measures 11.0 mmHg. Aortic  valve Vmax measures 2.34 m/s.   7. Aortic dilatation noted. There is borderline dilatation of the aortic  root, measuring 36 mm.   8. The inferior vena cava is normal in size with greater than 50%  respiratory variability, suggesting right atrial pressure of 3 mmHg.   IMPRESSION:  1. S/P aortic valve replacement with bioprosthetic valve: April 2016, Dr. Alva Jewels   2. Aortic stenosis, severe   3. Typical atrial flutter (HCC): S/P ablation, Dr. Nunzio Belch   4. Hyperlipidemia  with target LDL less than 70  5. Elevated Lp(a)   6. Chronic diastolic congestive heart failure (HCC)   7. Diastasis recti     ASSESSMENT AND PLAN: Mr. Riyad Keena is a young appearing 88 year-old gentleman who was found to have severe symptomatic critical aortic valve stenosis after he presented to the hospital following a presyncopal spell in 2016.  He successfully underwent aortic valve replacement surgery on 06/30/2014 with a 25 mm [facility] Ease bovine pericardial tissue valve.  He developed atrial flutter in October 2016 and underwent successful ablation.  He was taken off warfarin therapy as well as beta blocker therapy.  He continues to be on aspirin  alone.  He has done exceptionally well since his AVR and has remained asymptomatic.  [place], [place] lumbar and keeps extremely busy.  He has not had any recurrent atrial flutter and is status post ablation by Dr. Nunzio Belch in 2016.  Subsequent ECGs have shown sinus bradycardia with sinus arrhythmia and first-degree AV block.  His last echo Doppler study in March 2024 showed EF 60 to 65% without wall motion abnormalities.  He had a stable bioprosthetic aortic valve replacement with a mean gradient of 11 and a peak gradient at 21.8 mmHg.  He has been on low-dose statin therapy and apparently did not tolerate 20 mg dosing of rosuvastatin  which contributed to "brain fog."  He has documented LP little a elevation at 139.9 and target LDL is less than 50.  He tells me his primary physician will be rechecking laboratory in September 2025.  If LDL is elevated, consider addition of Zetia to his rosuvastatin  and possible initiation of PCSK9 inhibition with Repatha.  Clinically he remains stable 9 years following his AVR.  ECG today continues to show sinus bradycardia 59 bpm with first-degree AV block and incomplete right bundle branch block.  He is aware of my retirement later this month.  I have  recommended that he have a follow-up echo Doppler study in March 2026 and I will transition him to the care of Dr. Arnoldo Lapping for follow-up Cardiologic evaluation.   Millicent Ally, MD, FACC  08/18/2023 4:55 PM

## 2024-05-18 ENCOUNTER — Other Ambulatory Visit (HOSPITAL_COMMUNITY)

## 2024-06-10 ENCOUNTER — Ambulatory Visit: Admitting: Cardiovascular Disease
